# Patient Record
Sex: Male | Born: 1950 | Race: White | Hispanic: No | Marital: Married | Smoking: Former smoker
Health system: Southern US, Community
[De-identification: ages and names within clinical notes are randomized; demographics above are authoritative.]

## PROBLEM LIST (undated history)

## (undated) DIAGNOSIS — M5430 Sciatica, unspecified side: Secondary | ICD-10-CM

## (undated) DIAGNOSIS — R112 Nausea with vomiting, unspecified: Secondary | ICD-10-CM

## (undated) DIAGNOSIS — Z9889 Other specified postprocedural states: Secondary | ICD-10-CM

## (undated) DIAGNOSIS — G4730 Sleep apnea, unspecified: Secondary | ICD-10-CM

## (undated) DIAGNOSIS — K7460 Unspecified cirrhosis of liver: Secondary | ICD-10-CM

## (undated) DIAGNOSIS — I10 Essential (primary) hypertension: Secondary | ICD-10-CM

## (undated) DIAGNOSIS — Z87442 Personal history of urinary calculi: Secondary | ICD-10-CM

## (undated) HISTORY — DX: Sciatica, unspecified side: M54.30

## (undated) HISTORY — PX: HEMORRHOID SURGERY: SHX153

## (undated) HISTORY — PX: HERNIA REPAIR: SHX51

## (undated) HISTORY — DX: Unspecified cirrhosis of liver: K74.60

## (undated) HISTORY — PX: LITHOTRIPSY: SUR834

## (undated) HISTORY — PX: TONSILLECTOMY: SUR1361

## (undated) HISTORY — PX: TOOTH EXTRACTION: SUR596

---

## 2001-06-14 ENCOUNTER — Encounter: Payer: Self-pay | Admitting: Family Medicine

## 2001-06-14 ENCOUNTER — Ambulatory Visit (HOSPITAL_COMMUNITY): Admission: RE | Admit: 2001-06-14 | Discharge: 2001-06-14 | Payer: Self-pay | Admitting: Family Medicine

## 2001-11-16 ENCOUNTER — Encounter: Payer: Self-pay | Admitting: Emergency Medicine

## 2001-11-16 ENCOUNTER — Emergency Department (HOSPITAL_COMMUNITY): Admission: EM | Admit: 2001-11-16 | Discharge: 2001-11-16 | Payer: Self-pay | Admitting: Emergency Medicine

## 2003-06-23 ENCOUNTER — Emergency Department (HOSPITAL_COMMUNITY): Admission: EM | Admit: 2003-06-23 | Discharge: 2003-06-23 | Payer: Self-pay | Admitting: *Deleted

## 2003-07-06 ENCOUNTER — Encounter: Payer: Self-pay | Admitting: Urology

## 2003-07-06 ENCOUNTER — Ambulatory Visit (HOSPITAL_COMMUNITY): Admission: RE | Admit: 2003-07-06 | Discharge: 2003-07-06 | Payer: Self-pay | Admitting: Otolaryngology

## 2003-07-13 ENCOUNTER — Encounter: Payer: Self-pay | Admitting: Urology

## 2003-07-13 ENCOUNTER — Ambulatory Visit (HOSPITAL_COMMUNITY): Admission: RE | Admit: 2003-07-13 | Discharge: 2003-07-13 | Payer: Self-pay | Admitting: Urology

## 2003-07-19 ENCOUNTER — Encounter: Payer: Self-pay | Admitting: Urology

## 2003-07-19 ENCOUNTER — Ambulatory Visit (HOSPITAL_COMMUNITY): Admission: RE | Admit: 2003-07-19 | Discharge: 2003-07-19 | Payer: Self-pay | Admitting: Urology

## 2003-09-03 ENCOUNTER — Ambulatory Visit (HOSPITAL_COMMUNITY): Admission: RE | Admit: 2003-09-03 | Discharge: 2003-09-03 | Payer: Self-pay | Admitting: Urology

## 2004-03-03 ENCOUNTER — Ambulatory Visit (HOSPITAL_COMMUNITY): Admission: RE | Admit: 2004-03-03 | Discharge: 2004-03-03 | Payer: Self-pay | Admitting: *Deleted

## 2004-05-06 ENCOUNTER — Ambulatory Visit (HOSPITAL_COMMUNITY): Admission: RE | Admit: 2004-05-06 | Discharge: 2004-05-06 | Payer: Self-pay | Admitting: Internal Medicine

## 2005-05-01 ENCOUNTER — Ambulatory Visit (HOSPITAL_COMMUNITY): Admission: RE | Admit: 2005-05-01 | Discharge: 2005-05-01 | Payer: Self-pay | Admitting: Urology

## 2005-05-12 ENCOUNTER — Ambulatory Visit (HOSPITAL_COMMUNITY): Admission: RE | Admit: 2005-05-12 | Discharge: 2005-05-12 | Payer: Self-pay | Admitting: Urology

## 2005-11-20 ENCOUNTER — Ambulatory Visit (HOSPITAL_COMMUNITY): Admission: RE | Admit: 2005-11-20 | Discharge: 2005-11-20 | Payer: Self-pay | Admitting: Internal Medicine

## 2005-11-20 ENCOUNTER — Ambulatory Visit: Payer: Self-pay | Admitting: Internal Medicine

## 2005-11-20 HISTORY — PX: COLONOSCOPY: SHX174

## 2007-07-29 ENCOUNTER — Emergency Department (HOSPITAL_COMMUNITY): Admission: EM | Admit: 2007-07-29 | Discharge: 2007-07-29 | Payer: Self-pay | Admitting: Emergency Medicine

## 2008-03-22 ENCOUNTER — Ambulatory Visit (HOSPITAL_COMMUNITY): Admission: RE | Admit: 2008-03-22 | Discharge: 2008-03-22 | Payer: Self-pay | Admitting: Family Medicine

## 2008-05-02 ENCOUNTER — Encounter (INDEPENDENT_AMBULATORY_CARE_PROVIDER_SITE_OTHER): Payer: Self-pay | Admitting: General Surgery

## 2008-05-02 ENCOUNTER — Ambulatory Visit (HOSPITAL_COMMUNITY): Admission: RE | Admit: 2008-05-02 | Discharge: 2008-05-03 | Payer: Self-pay | Admitting: General Surgery

## 2009-01-07 ENCOUNTER — Ambulatory Visit (HOSPITAL_COMMUNITY): Admission: RE | Admit: 2009-01-07 | Discharge: 2009-01-07 | Payer: Self-pay | Admitting: General Surgery

## 2009-01-18 ENCOUNTER — Ambulatory Visit (HOSPITAL_COMMUNITY): Admission: RE | Admit: 2009-01-18 | Discharge: 2009-01-18 | Payer: Self-pay | Admitting: General Surgery

## 2010-01-24 ENCOUNTER — Inpatient Hospital Stay (HOSPITAL_COMMUNITY): Admission: EM | Admit: 2010-01-24 | Discharge: 2010-01-28 | Payer: Self-pay | Admitting: Emergency Medicine

## 2010-01-27 ENCOUNTER — Encounter (INDEPENDENT_AMBULATORY_CARE_PROVIDER_SITE_OTHER): Payer: Self-pay | Admitting: Urology

## 2010-01-29 ENCOUNTER — Emergency Department (HOSPITAL_COMMUNITY): Admission: EM | Admit: 2010-01-29 | Discharge: 2010-01-29 | Payer: Self-pay | Admitting: Emergency Medicine

## 2010-01-31 ENCOUNTER — Emergency Department (HOSPITAL_COMMUNITY): Admission: EM | Admit: 2010-01-31 | Discharge: 2010-01-31 | Payer: Self-pay | Admitting: Emergency Medicine

## 2010-03-11 ENCOUNTER — Ambulatory Visit (HOSPITAL_COMMUNITY): Admission: RE | Admit: 2010-03-11 | Discharge: 2010-03-11 | Payer: Self-pay | Admitting: Urology

## 2010-08-27 ENCOUNTER — Ambulatory Visit (HOSPITAL_COMMUNITY): Admission: RE | Admit: 2010-08-27 | Discharge: 2010-08-27 | Payer: Self-pay | Admitting: Internal Medicine

## 2011-01-25 LAB — CBC
HCT: 45.3 % (ref 39.0–52.0)
Hemoglobin: 15.9 g/dL (ref 13.0–17.0)
MCHC: 34.9 g/dL (ref 30.0–36.0)
MCV: 92.1 fL (ref 78.0–100.0)
MCV: 93.1 fL (ref 78.0–100.0)
Platelets: 154 10*3/uL (ref 150–400)
Platelets: 168 10*3/uL (ref 150–400)
RBC: 4.98 MIL/uL (ref 4.22–5.81)
RDW: 14.2 % (ref 11.5–15.5)
WBC: 12.3 10*3/uL — ABNORMAL HIGH (ref 4.0–10.5)
WBC: 8.2 10*3/uL (ref 4.0–10.5)

## 2011-01-25 LAB — DIFFERENTIAL
Basophils Absolute: 0 10*3/uL (ref 0.0–0.1)
Basophils Relative: 0 % (ref 0–1)
Basophils Relative: 1 % (ref 0–1)
Eosinophils Absolute: 0.2 10*3/uL (ref 0.0–0.7)
Eosinophils Relative: 1 % (ref 0–5)
Eosinophils Relative: 2 % (ref 0–5)
Lymphocytes Relative: 9 % — ABNORMAL LOW (ref 12–46)
Lymphs Abs: 1.1 10*3/uL (ref 0.7–4.0)
Monocytes Absolute: 0.5 10*3/uL (ref 0.1–1.0)
Monocytes Relative: 4 % (ref 3–12)
Neutro Abs: 10.7 10*3/uL — ABNORMAL HIGH (ref 1.7–7.7)
Neutro Abs: 5.1 10*3/uL (ref 1.7–7.7)
Neutrophils Relative %: 62 % (ref 43–77)
Neutrophils Relative %: 87 % — ABNORMAL HIGH (ref 43–77)

## 2011-01-25 LAB — GLUCOSE, CAPILLARY
Glucose-Capillary: 108 mg/dL — ABNORMAL HIGH (ref 70–99)
Glucose-Capillary: 110 mg/dL — ABNORMAL HIGH (ref 70–99)
Glucose-Capillary: 119 mg/dL — ABNORMAL HIGH (ref 70–99)
Glucose-Capillary: 119 mg/dL — ABNORMAL HIGH (ref 70–99)
Glucose-Capillary: 123 mg/dL — ABNORMAL HIGH (ref 70–99)
Glucose-Capillary: 125 mg/dL — ABNORMAL HIGH (ref 70–99)
Glucose-Capillary: 129 mg/dL — ABNORMAL HIGH (ref 70–99)
Glucose-Capillary: 148 mg/dL — ABNORMAL HIGH (ref 70–99)
Glucose-Capillary: 149 mg/dL — ABNORMAL HIGH (ref 70–99)
Glucose-Capillary: 161 mg/dL — ABNORMAL HIGH (ref 70–99)
Glucose-Capillary: 97 mg/dL (ref 70–99)

## 2011-01-25 LAB — URINE CULTURE
Colony Count: NO GROWTH
Culture: NO GROWTH

## 2011-01-25 LAB — BASIC METABOLIC PANEL
BUN: 11 mg/dL (ref 6–23)
CO2: 27 mEq/L (ref 19–32)
Calcium: 9.5 mg/dL (ref 8.4–10.5)
Calcium: 9.9 mg/dL (ref 8.4–10.5)
Chloride: 102 mEq/L (ref 96–112)
Creatinine, Ser: 0.86 mg/dL (ref 0.4–1.5)
Creatinine, Ser: 1.1 mg/dL (ref 0.4–1.5)
GFR calc Af Amer: >60 mL/min (ref >60)
GFR calc Af Amer: >60 mL/min (ref >60)
GFR calc non Af Amer: >60 mL/min (ref >60)
Glucose, Bld: 159 mg/dL — ABNORMAL HIGH (ref 70–99)
Potassium: 4.6 mEq/L (ref 3.5–5.1)
Sodium: 138 mEq/L (ref 135–145)

## 2011-01-25 LAB — URINE MICROSCOPIC-ADD ON

## 2011-01-25 LAB — URINALYSIS, ROUTINE W REFLEX MICROSCOPIC
Glucose, UA: NEGATIVE mg/dL
Leukocytes, UA: NEGATIVE
Nitrite: NEGATIVE
Protein, ur: 30 mg/dL — AB
Urobilinogen, UA: 0.2 mg/dL (ref 0.0–1.0)

## 2011-02-12 LAB — BASIC METABOLIC PANEL
CO2: 25 mEq/L (ref 19–32)
Chloride: 102 mEq/L (ref 96–112)
GFR calc Af Amer: >60 mL/min (ref >60)
GFR calc non Af Amer: >60 mL/min (ref >60)
Glucose, Bld: 116 mg/dL — ABNORMAL HIGH (ref 70–99)
Potassium: 4.3 mEq/L (ref 3.5–5.1)
Sodium: 136 mEq/L (ref 135–145)

## 2011-02-12 LAB — CBC
HCT: 43.9 % (ref 39.0–52.0)
Hemoglobin: 15.3 g/dL (ref 13.0–17.0)
MCHC: 34.8 g/dL (ref 30.0–36.0)
MCV: 94.8 fL (ref 78.0–100.0)
RBC: 4.64 MIL/uL (ref 4.22–5.81)
RDW: 14.2 % (ref 11.5–15.5)
WBC: 8.2 10*3/uL (ref 4.0–10.5)

## 2011-02-12 LAB — GLUCOSE, CAPILLARY: Glucose-Capillary: 98 mg/dL (ref 70–99)

## 2011-03-17 NOTE — H&P (Signed)
NAME:  Eric Dougherty, Eric Dougherty NO.:  [id number]   MEDICAL RECORD NO.:  [id number]         PATIENT TYPE:  AMB   LOCATION:  DAY                           FACILITY:  [facility]   PHYSICIAN:  Tilford Pillar, MD      DATE OF BIRTH:  [birth year]   DATE OF ADMISSION:  DATE OF DISCHARGE:  LH                              HISTORY & PHYSICAL   CHIEF COMPLAINT:  1. Right upper arm granule.  2. Umbilical hernia.   HISTORY OF PRESENT ILLNESS:  The patient is a 60 year old male who was  referred from [facility] office with 2 separate issues.  The first  has been present for several years with a notable mass in the right  posterior arm.  This has been slowly increased in size, is not relieved  without any pain.  No discomfort.  He has had no erythema or skin  changes overlying this.  He has had no discharge noted from this area.  Due to successfully slowly increasing size and occasional discomfort  with pressure related symptomatology, the patient was interested in  possible removal.  The second issue was a umbilical hernia, which he has  noted for approximately 1 year.  This is again slowly increased in size.  He has not had any signs and symptoms of incarceration or strangulation.  He has had no nausea, vomiting, no bowel changes and does have some  local discomfort in the area occasionally, but often he is asymptomatic.   PAST MEDICAL HISTORY:  1. Hypertension.  2. Diabetes mellitus type 2.  3. Hypercholesterolemia.  4. Sleep apnea.   PAST SURGICAL HISTORY:  Hemorrhoidectomy.   MEDICATIONS:  1. Aspirin.  2. Zocor.  3. Metformin.  4. He is on WelChol as well as on home CPAP.   ALLERGIES:  No known drug allergies.   SOCIAL HISTORY:  He does have a tobacco use history, but no current  usage, occasional alcohol use.  No recreational drug use.  Occupation,  he is a Higher education careers adviser.  He does not have any significant  heavy lifting.   REVIEW OF SYSTEMS:   CONSTITUTIONAL:  Unremarkable.  EYES:  Unremarkable.  EARS, NOSE, AND THROAT:  Unremarkable.  RESPIRATORY:  Unremarkable.  CARDIOVASCULAR:  Unremarkable.  GASTROINTESTINAL:  Unremarkable.  GENITOURINARY:  Unremarkable.  MUSCULOSKELETAL:  Unremarkable.  SKIN:  As per HPI otherwise unremarkable.  ENDOCRINE:  Occasional lack of  energy.  NEUROLOGIC:  Unremarkable.   PHYSICAL EXAMINATION:  GENERAL:  The patient is obese.  He is calm in no  acute distress.  He is alert and oriented x3.  HEENT:  Scalp, no deformities.  No masses.  Eyes, pupils equal, round,  and reactive.  Extraocular movements are intact.  No scleral icterus or  conjunctival pallor is noted.  Oral mucosa is pink and moist.  Trachea  is midline.  No cervical lymphadenopathy is present.  PULMONARY:  Unlabored respiration.  No wheezes.  No crackles.  He is clear to  auscultation bilaterally.  CARDIOVASCULAR:  Regular rate and rhythm.  No murmurs.  No gallops.  He  has 2+ radial  pulses.  ABDOMEN:  He has bowel sounds.  Abdomen is soft and nontender.  He does  have a small reducible umbilical hernia.  This is nontender.  He also  does have some bulging above the umbilicus in the upper abdomen  consistent with a diastasis.  GENITALIA:  Normal-appearing external genitalia.  Bilateral descended  testicles.  SKIN:  Warm and dry.  He does have a right posterior arm left shoulder  nodule.  This is mobile approximately 3 cm in size and soft,  symmetrical.   ASSESSMENT AND PLAN:  1. Lipoma the right arm  2. Umbilical hernia.  At this point, I did discuss with the patient's      findings, and did discuss with the patient the possibility of      excision of the lipoma at the same time this is umbilical hernia      repair.  The risks, benefits, and alternatives, which were      discussed at length with the patient.  The patient's questions and      questions concerns were addressed regarding this.  The risks,      benefits, and  alternatives of laparoscopic possible open approach      to the umbilical hernia repair were discussed also with the      patient.  The patient does wish to proceed with the umbilical      hernia via laparoscopic approach.  He is aware that this does have      potential having more postoperative pain associated with it and      understands the slightly decreased risk of mesh infection and      slightly decreased risk of recurrence due to the patient's history      of sleep apnea.  I did explain to the patient that I will watch him      overnight as a same-day admission due to the sleep apnea and need      for general anesthetic for his laparoscopic umbilical hernia      repair.  He is understands and does wish to proceed.      Tilford Pillar, MD  Electronically Signed     BZ/MEDQ  D:  04/26/2008  T:  04/27/2008  Job:  [id number]

## 2011-03-17 NOTE — Op Note (Signed)
NAME:  Eric Dougherty, PREIS NO.:  [id number]   MEDICAL RECORD NO.:  [id number]          PATIENT TYPE:  OIB   LOCATION:  A332                          FACILITY:  [facility]   PHYSICIAN:  Tilford Pillar, MD      DATE OF BIRTH:  January 14, 1951   DATE OF PROCEDURE:  05/02/2008  DATE OF DISCHARGE:                               OPERATIVE REPORT   PREOPERATIVE DIAGNOSES:  1. Umbilical hernia.  2. Soft-tissue mass of the right posterior arm.   POSTOPERATIVE DIAGNOSES:  1. Umbilical hernia.  2. Sebaceous cyst of the right posterior arm.   PROCEDURES:  1. Laparoscopic umbilical hernia repair with 10/15 cm Proceed mesh.  2. Excision of a sebaceous cyst of the right posterior arm through a 3-      cm incision.   SURGEON:  Tilford Pillar, MD   ANESTHESIA:  General endotracheal local anesthetic 0.5% Sensorcaine  plain.   SPECIMEN:  Sebaceous cyst and overlying skin of the right posterior arm.   ESTIMATED BLOOD LOSS:  Minimal.   INDICATIONS:  The patient is a 60 year old male with a history of sleep  apnea and diabetes mellitus.  He presented to my office with umbilical  hernia as well as a soft tissue mass in the right posterior arm.  We  discussed the possibility of laparoscopic, possible open umbilical  hernia repair as well as the an excision of the soft tissue mass at the  same time.  The risks, benefits and alternatives, which were discussed  at length with the patient including, but not limited to the risk of  bleeding, infection, mesh infection requiring removal of the mesh,  recurrence of the hernia or the soft tissue mass as well as possibility  of intraoperative cardiac or pulmonary events.  The patient's questions  and concerns were addressed, and the patient was consented for the  planned procedure.   OPERATION:  The patient was taken to the operating room, he was placed  in supine position on the operating table, at which time the general  anesthetic was  administered.  Once the patient was asleep, he was  endotracheally intubated by anesthesia.  Glide scope was required for  intubation and used to the somewhat difficult airway of the patient;  however, intubation was successfully obtained.  At this point, a Foley  catheter was placed in sterile fashion by the operating room staff.  His  abdomen was then prepped with DuraPrep solution and then draped in  standard fashion.  At this point, stab incision was created in the left  subcostal margin at palmar point.  Veress needle was inserted.  Saline  drop test was utilized to confirm intraperitoneal placement and  pneumoperitoneum was initiated.  Once sufficient pneumoperitoneum was  obtained.  An 11 mm trocar was placed in the left lateral abdominal wall  and slightly superior to the umbilicus.  This was placed over a  laparoscope allowing visualization trocar entering into the peritoneal  cavity.  The inner cannula was then removed and the laparoscope was  reinserted.  There was no evidence of any trocar or Veress needle  placement injury,  The Veress needle was removed at this time.  At this  time, a 5 mm trocar was placed in the left lateral abdominal wall just  inferior to the 11 mm trocar and the laparoscope was exchanged for a 30  degrees scope.  At this time, I easily identified the small umbilical  hernia defect.  This was approximately 1.5-2 cm in circumference.  The  falciform was divided approximately midway allowing adequate winding for  mesh to be tacked.  Hemostasis obtained using electrocautery.  At this  point using palpation of the anterior abdominal wall and marking pen was  utilized to mark the planned sites of pexing sutures.  Pneumoperitoneum  was then evacuated allowing adequate measurement of the defect, which  was approximately 10 x 10 cm.  Therefore 410 x 15 cm piece of Proceed  mesh brought the field. It was trimmed to the appropriate size.  It was  marked with  marking pen and the 2-0 Novofil pexing sutures were placed  at the four quadrants of the mesh.   The mesh was unrolled and was placed to the 11 mm trocar.  It was  unrolled within the abdominal cavity and using stab incisions at the  previously placed pexing sites.  A Endoclose suture passing device was  utilized to pass the Novofil sutures through the anterior abdominal wall  in all four pexing quadrants.  These were then pulled taut and the mesh  was evaluated.  This was done in excellent position.  The spiral ProTack  stapler was then brought to the field and was utilized to  circumferentially tack the mesh to the anterior abdominal wall.  This  resulted in excellent placement of the mesh.  This was quite clipped and  pleased with the appearance, and at this time attention was turned to  closure of the trocar sites   The pneumoperitoneum was evacuated.  The fascia was reapproximated using  a 2-0 Vicryl and UR needle with the fascia reapproximated.  A 4-0  Monocryl and was utilized to reapproximate skin edges at both trocar  sites in a running subcuticular suture.  The skin was washed, dried with  moist dry towel.  Benzoin was applied around the trocar sites and the 5  resulting stab incisions and 0.5 inch Steri-Strips were placed over  these stab incisions.  At this point, the drapes were removed and the  patient was repositioned.   The patient's right arm was gently abducted and placed over his chest  allowing exposure of the soft tissue mass on the right posterior arm  using a marking pen.  After prepping the field, sterile drapes were  placed and marking pen was utilized to mark the planned incision.  Scalpel was utilized to create the initial skin incision.  This  dissection down through subcuticular tissue utilized and carried out  using electrocautery.  The soft tissue mass was excised in  circumferentially with a elliptical defect of the skin incision created  again with the  scalpel.  The overlying skin was removed due to the close  approximation of the mass to the skin.  This mass was then placed in the  back table.  It was evident that this was a sebaceous cyst based on its  appearance and upon placing on back table and getting sebaceous material  from the inside of the mass.  At this point, the wound was irrigated.  Hemostasis was obtained using electrocautery.  A 3-0 Vicryl was utilized  to reapproximate deep subcuticular tissue and 4-0 Monocryl was utilized  to reapproximate skin edges in a running circular suture.  Skin was  washed and dried with moist dry towel.  Benzoin was applied.  After  injecting the local anesthetic, 05 inch Steri-Strips were then placed  over the incision.  The drapes  were removed.  The patient was allowed to come out of general anesthetic  and transferred back to regular hospital bed in stable condition.  At  the conclusion of the procedure, all instruments, sponge, and needle  counts were correct.  The patient tolerated procedure well.      Tilford Pillar, MD  Electronically Signed     BZ/MEDQ  D:  05/02/2008  T:  05/02/2008  Job:  [id number]   cc:   Petra Kuba, M.D.  Fax: [phone]

## 2011-03-17 NOTE — H&P (Signed)
NAME:  Eric Dougherty, Eric Dougherty NO.:  [id number]   MEDICAL RECORD NO.:  [id number]           PATIENT TYPE:  AMB   LOCATION:  DAY                           FACILITY:  [facility]   PHYSICIAN:  Tilford Pillar, MD      DATE OF BIRTH:  June 30, 1951   DATE OF ADMISSION:  DATE OF DISCHARGE:  LH                              HISTORY & PHYSICAL   CHIEF COMPLAINT:  1. Bulge over his umbilicus and discomfort.  2. Pain in his right fourth finger and occasionally pricking      sensation.   HISTORY OF PRESENT ILLNESS:  The patient is a 60 year old male, who  previously was seen by me for umbilical hernia.  He actually underwent  an uneventful laparoscopic hernia repair, had uneventful recovery and  was doing extremely well.  He then noted over the last subsequent month,  a increasing pressure sensation just above the umbilicus and noted  increasing area of bulging above this area.  He did not recall any  popping sensation or episodes of trauma to this area.  He has had no  nausea, vomiting, no change with bowel history, no melena, and no  hematochezia.  He states he does have also noticed increased pricking of  his fourth finger with occasional pain and the finger becoming hot and  stiff in position which has been known to come on  without aggravation.  He has had no fever or chills.  No history of trauma.   PAST MEDICAL HISTORY:  1. Hypertension.  2. Diabetes mellitus type 2.  3. Hypercholesterolemia.  4. Obstructive sleep apnea.   PAST SURGICAL HISTORY:  He has had hemorrhoidectomy, lipoma excision,  previous umbilical hernia repair.   MEDICATIONS:  Aspirin, Zocor, metformin, and WelChol.   ALLERGIES:  No known drug allergies.   SOCIAL HISTORY:  No tobacco, no alcohol, no recreational drugs.   REVIEW OF SYSTEMS:  Unremarkable for all systems including  constitutional, eyes, ears, nose, throat, respiratory, cardiovascular,  gastrointestinal, other than in the HPI.  Genitourinary,  skin,  musculoskeletal again other than in the HPI.   PHYSICAL EXAMINATION:  GENERAL:  The patient is a healthy-appearing male  in no acute distress.  He is alert and oriented x3.  He is calm on  presentation.  HEENT:  Scalp no deformities, no masses.  Eyes:  Pupils equal, round,  and reactive.  Extraocular movements are intact.  No scleral icterus or  conjunctival pallor is noted.  Oral mucosa is pink.  Normal occlusion.  NECK:  Trachea is midline.  No cervical lymphadenopathy.  PULMONARY:  Unlabored respiration.  Clear to auscultation bilaterally.  CARDIOVASCULAR:  Regular rate and rhythm.  A 2+ radial and dorsalis  pedis pulses bilaterally.  ABDOMEN:  Positive bowel sounds.  Abdomen is soft and nontender.  He  does have a palpable umbilical hernia.  There is a palpable  supraumbilical bulge, which is easy to reduce upon palpation.  No other  masses.  No other herniations.  SKIN:  Warm and dry.  EXTREMITIES:  The patient's finger skin does get hot in a flexed  position, it is relieved with active  extension of the joints.  There is  a popping sensation with full extension of the finger.   STUDIES:  CT of the abdomen does demonstrate a small umbilical hernia  with no evidence of any incarcerated material.   ASSESSMENT AND PLAN:  1. Recurrent umbilical hernia.  2. Trigger finger at the fourth digit.   PLAN:  1. In regards to the patient's recurrent umbilical hernia, I did      discuss with the patient again on proceeding with the repair.  At      this time, I suspected the mesh was pulled away superiorly,      although no active symptoms or clinical history of the patient      noting any sensation of a pop was noted.  Based on the findings, I      suspect that the mesh may have tore the staples.  Based on this, I      did discuss with the patient options of repairing including both      open and repeat of laparoscopic repair as the patient tolerated the      previous  laparoscopic repair extremely well and based on the fact      that this would give him the best chance for repair.  I did discuss      returning to repeat the laparoscopic umbilical hernia repair.  The      patient's questions and concerns were addressed, and the patient      will be consented for the planned procedure.  2. In regards to his trigger finger or flexor tenosynovitis (FTS)  of      the right ring finger, I did discuss referral to a hand specialist      in [place].  Since his initial visit he was evaluated by Dr.      Merlyn Lot in [place] and was offered cortisone injections.  This has      significantly improved his symptomatology and he will continue to      follow up with their office for continued treatment of this.  At      this time, I will plan to proceed at the patient's earliest      convenience for repair.      Tilford Pillar, MD  Electronically Signed     BZ/MEDQ  D:  01/15/2009  T:  01/16/2009  Job:  [id number]   cc:   [facility]  [place], [place]

## 2011-03-17 NOTE — Op Note (Signed)
NAME:  Eric Dougherty, Eric Dougherty NO.:  [id number]   MEDICAL RECORD NO.:  [id number]          PATIENT TYPE:  AMB   LOCATION:  DAY                           FACILITY:  [facility]   PHYSICIAN:  Tilford Pillar, MD      DATE OF BIRTH:  1951-01-28   DATE OF PROCEDURE:  01/18/2009  DATE OF DISCHARGE:                               OPERATIVE REPORT   PREOPERATIVE DIAGNOSIS:  Recurrent ventral incisional hernia.   POSTOPERATIVE DIAGNOSIS:  __________ventral hernia.   PROCEDURE:  1. Laparoscopic herniorrhaphy with 5 x 10 cm __________ stent.  2. Intraoperative placement of a On-Q analgesic catheter via direct      visualization of the laparoscope.   SURGEON:  Tilford Pillar, MD   ANESTHESIA:  General endotracheal local anesthetic 0.5 % bupivacaine  administered via the On-Q pain catheter.   SPECIMEN:  None.   ESTIMATED BLOOD LOSS:  Minimal.   INTRAOPERATIVE FINDINGS:  Previous __________.   INDICATIONS:  The patient is a 60 year old male who presented to my  office with notable area on his abdominal wall with increased bulging  and protruding.  This was over his umbilicus, similar previous ventral  hernia repair.  He had noticed increased discomfort in this area.  On  evaluation, was suspicious for recurrence.  A CT was obtained which  confirmed this did demonstrate a __________defect.  The risks, benefits,  and alternatives of a laparoscopic repair again were discussed with the  patient including, but not limited to the risk of bleeding, infection,  infection of the mesh requiring removal and subsequent repair as well as  possibility of intraoperative bowel injury, intraoperative cardiac and  pulmonary events.  The patient's questions and concerns were addressed  and the patient consented for the planned procedure.   OPERATION:  The patient was taken to the operating room, placed in the  supine position on the operating table, at which time the general  anesthetic was  administered.  Once the patient was asleep, she was  endotracheally intubated by Anesthesia.  His abdomen was prepped with  DuraPrep solution and draped in standard fashion.  Once the drapes were  placed, a stab incision was created in the left subcostal margin at  Palmer's point through which the Veress needle was inserted.  Saline  drop test was utilized to confirm the intraperitoneal placement, and  then the __________ within the pneumoperitoneum was initiated.  Once the  patient's pneumoperitoneum was obtained, a stab incision was created in  the left lateral abdominal wall over the previous stab incision scar,  through which a 11-mm trocar was inserted over the laparoscope allowing  visualization of the trocar entering into the peritoneal cavity.  Once  the catheter was placed, the inner cannula was removed.  The laparoscope  was reinserted.  There was no evidence of any trocar or Veress needle  placement injury with visualization.  At this time, the Veress needle  was removed from the subcostal margin.  At this point, inspection of the  anterior abdominal wall demonstrated an significant omental adhesions.  At this time, a 5-mm trocar was placed  just superior to the 11-mm trocar  site for a working port.  Combination of sharp and harmonic dissection  was utilized to free the omentum from the anterior abdominal wall.  The  mesh was exposed in total.  On visualization of the previously placed  spiral staple line securing the mesh in the anterior abdominal wall.  On  inspection of the mesh, the mesh was well granulated into the anterior  abdominal wall and the edges of the mesh were all intact.  There was no  evidence of any tearing of the staples or a defect along the edge or  suspicion of recurrence was high.  At this point, we continued to  palpate the abdominal wall in efforts to identify the natural defect.  At this point, on inspection, I was able to find a small tear within the   mesh itself and this was in the area where the site of hernia was.  In  addition, there was some omental fat that was able to be pulled out of  this area confirming that this was the area of recurrence.  At this  time, I did palpate the anterior abdominal wall to size the appropriate  piece of mesh for repair.  A marking pen was utilized to mark the  planned lateral, superior, and inferior borders.  Pneumoperitoneum was  evacuated __________ 5 x 10 cm piece of Prolene mesh was brought to the  field.  This was marked for appropriate orientation and then 2-0 Novofil  sutures were utilized for the tacking sutures at all 4 quadrants of the  mesh.  Mesh was rolled and advanced through the 11-mm trocar site.  At  this time, the mesh was unrolled __________   At this time, I did turn my attention to affixing the mesh to the  anterior abdominal wall.  Stab incisions were created at the previously  marked site through which a Endoclose suture passing device was utilized  to pull the 2-0 Novofil sutures through the anterior abdominal wall.  With all 4 of these pulled into position and the mesh lying in good  position around the defect, I secured all 4 sutures in the anterior  abdominal wall.  I then brought the spiral Protack device to the field  and tacked the mesh circumferentially in the anterior abdominal wall.  I  was quite pleased with the repair, turned the attention to closure.   Near the stab incision at [facility], I advanced the insertion  sheath through the On-Q pain pump.  The catheters were placed under the  left and right subcostal margins.  __________ around that area to secure  the catheters in normal fashion.  The catheters had been advanced in the  subcuticular space under direct visualization with the laparoscopy  ensuring no peritoneal invasion with the catheters.  At this time,  pneumoperitoneum was evacuated with the trocars.  I used a 4-0 Vicryl on  a UR needle to  secure the fascia at the 11-mm trocar site.  Local  anesthetic was instilled and the skin was reapproximated at the trocar  sites with 4-0 Monocryl.  It was washed and dried with moist dry towel  and benzoin was applied around the trocar site as well as the stab  incisions for the tacking sutures.  Half-inch Steri-Strips were placed  at all trocar and stab incision site.  At this time, the patient was  allowed to come out of the general anesthetic and was transferred back  to regular hospital bed in stable condition.  At the conclusion of  procedure, all instrument, sponge, and needle counts were correct.  The  patient tolerated the procedure extremely well.      Tilford Pillar, MD  Electronically Signed     BZ/MEDQ  D:  01/18/2009  T:  01/19/2009  Job:  [id number]   cc:   Primary Care Physician  ________[facility]

## 2011-03-20 NOTE — Op Note (Signed)
NAME:  Eric Dougherty, Eric Dougherty NO.:  [id number]   MEDICAL RECORD NO.:  [id number]          PATIENT TYPE:  AMB   LOCATION:  DAY                           FACILITY:  [facility]   PHYSICIAN:  R. Roetta Sessions, M.D. DATE OF BIRTH:  [birth year]   DATE OF PROCEDURE:  11/20/2005  DATE OF DISCHARGE:                                 OPERATIVE REPORT   PROCEDURE:  Screening colonoscopy.   INDICATIONS FOR PROCEDURE:  The patient is a 60 year old gentleman referred  by [facility] for screening colonoscopy. [name] There is no family history of  colorectal neoplasia. He has not had any rectal bleeding. He did see Dr.  Domingo Sep as a matter of routine recently and had had a five-day history of  right lower quadrant abdominal pain. An oral contrast CT was obtained at  [facility] which revealed no abnormalities aside from minimal  atelectasis in the lingula. No evidence of appendicitis. There was mentioned  of scattered diverticula in the distal colon but nothing inflammatory. He  tells me his right lower quadrant abdominal pain has subsided. Colonoscopy  is now being done. This approach has been discussed with the patient at  length. Potential risks, benefits, and alternatives have been reviewed and  questions answered. He is agreeable. Please see documentation in the medical  record.   PROCEDURE NOTE:  O2 saturation, blood pressure, pulse, and respirations were  monitored throughout the entire procedure. Conscious sedation with Versed 4  mg IV and Demerol 100 mg IV in divided doses.   INSTRUMENT:  Olympus video chip system.   FINDINGS:  Digital rectal exam revealed tender rectum and hemorrhoid.  Otherwise negative. Some viscous Xylocaine was used for topical anal canal  anesthesia.   Rectum:  Examination of the rectal mucosa including retroflexed view of the  anal verge revealed no abnormalities.   Colon:   Colonic mucosa was surveyed from the rectosigmoid junction through  the left, transverse, and right colon to the area of the appendiceal  orifice, ileocecal valve, and cecum. These structures were well seen and  photographed for the record. The terminal ileum was intubated a good 15 to  20 cm. From this level, the scope was slowly withdrawn, and all previously  mentioned mucosal surfaces were again seen. The colonic mucosa appeared  entirely normal. The terminal ileal mucosa appeared normal. The patient  tolerated the procedure well and was reactive to endoscopy.   IMPRESSION:  Minimal anal canal hemorrhoids. Otherwise normal rectum, colon  and terminal ileum.   RECOMMENDATIONS:  Repeat colonoscopy 10 years.      Jonathon Bellows, M.D.  Electronically Signed     RMR/MEDQ  D:  11/20/2005  T:  11/20/2005  Job:  [id number]   cc:   [facility]

## 2011-07-01 ENCOUNTER — Emergency Department (HOSPITAL_COMMUNITY)
Admission: EM | Admit: 2011-07-01 | Discharge: 2011-07-01 | Disposition: A | Discharge status: Home / Self Care | Payer: BC Managed Care – PPO | Attending: Emergency Medicine | Admitting: Emergency Medicine

## 2011-07-01 DIAGNOSIS — I10 Essential (primary) hypertension: Secondary | ICD-10-CM | POA: Insufficient documentation

## 2011-07-01 DIAGNOSIS — M549 Dorsalgia, unspecified: Secondary | ICD-10-CM | POA: Insufficient documentation

## 2011-07-01 DIAGNOSIS — M62838 Other muscle spasm: Secondary | ICD-10-CM | POA: Insufficient documentation

## 2011-07-01 DIAGNOSIS — E119 Type 2 diabetes mellitus without complications: Secondary | ICD-10-CM | POA: Insufficient documentation

## 2011-07-01 HISTORY — DX: Essential (primary) hypertension: I10

## 2011-07-01 MED ORDER — IBUPROFEN 800 MG PO TABS: 800.0000 mg | ORAL_TABLET | Freq: Three times a day (TID) | ORAL | Status: AC

## 2011-07-01 MED ORDER — HYDROCODONE-ACETAMINOPHEN 5-500 MG PO TABS: 1.0000 | ORAL_TABLET | Freq: Four times a day (QID) | ORAL | Status: AC | PRN

## 2011-07-01 MED ORDER — KETOROLAC TROMETHAMINE 60 MG/2ML IM SOLN
60.0000 mg | Freq: Once | INTRAMUSCULAR | Status: AC
  Administered 2011-07-01: 60 mg via INTRAMUSCULAR
  Filled 2011-07-01: qty 2

## 2011-07-01 NOTE — Discharge Instructions (Signed)
Your back pain should be treated with medicines such as ibuprofen or aleve and this back pain should get better over the next 2 weeks.  However if you develop severe or worsening pain, low back pain with fever, numbness, weakness or inability to walk or urinate, you should return to the ER immediately.  Please follow up with your doctor this week for a recheck if still having symptoms.  

## 2011-07-01 NOTE — ED Provider Notes (Addendum)
History   Scribed for Vida Roller, MD, the patient was seen in room APA04/APA04. This chart was scribed by Clarita Crane. This patient's care was started at 9:34AM.   CSN: [id number] Arrival date & time: 07/01/2011  9:03 AM  Chief Complaint  Patient presents with  . Back Pain   The history is provided by the patient.   Eric Dougherty is a 60 y.o. male who presents to the Emergency Department complaining of constant non-radiating lower back pain onset several days ago but worse yesterday and persistent since. Denies recent injury/trauma, weakness, dysuria, dark urine, hematuria, n/v/d, fever, constipation, rectal bleeding, abdominal pain, chest pain, SOB. States back pain is aggravated with movement (specifically moving from a standing position to sitting position and vice versa). Pain moderately relieved with standing.   Patient is intermittently severe worse with movement not associated with urinary retention, fever, weakness. He denies history of cancer. Medications including Robaxin and ibuprofen taken prior to arrival without any improvement  Not similar to kidney stone Was on feet for several hours before pain became worse   moderately relieved with standing Denies dysuria, hematuria No relief from Ibuprofen Patient with h/o 7mm kidney stone dx last year, patient was evaluated by urologist for condition.   Past Medical History  Diagnosis Date  . Diabetes mellitus   . Hypertension     Past Surgical History  Procedure Date  . Tonsillectomy   . Hernia repair   . Hemorrhoid surgery   . Lithotripsy     No family history on file.  History  Substance Use Topics  . Smoking status: Never Smoker   . Smokeless tobacco: Not on file  . Alcohol Use: Yes      Review of Systems 10 Systems reviewed and are negative for acute change except as noted in the HPI.  Physical Exam  BP 144/72  Pulse 80  Temp(Src) 97.7 F (36.5 C) (Oral)  Resp 20  Ht 5\' 8"  (1.727 m)  Wt 250 lb  (113.399 kg)  BMI 38.01 kg/m2  SpO2 97%  Physical Exam  Nursing note and vitals reviewed. Constitutional: He is oriented to person, place, and time. He appears well-developed and well-nourished.  HENT:  Head: Normocephalic and atraumatic.  Eyes: Pupils are equal, round, and reactive to light.  Neck: Neck supple.  Cardiovascular: Normal rate, regular rhythm and normal heart sounds.  Exam reveals no gallop and no friction rub.   No murmur heard. Pulmonary/Chest: Effort normal and breath sounds normal. He has no wheezes.  Musculoskeletal: Normal range of motion. He exhibits no edema.       Entire spine non-tender. No paraspinal tenderness.   Neurological: He is alert and oriented to person, place, and time. No sensory deficit.       Strength of lower extremities equal bilaterally.   Skin: Skin is warm and dry.  Psychiatric: He has a normal mood and affect. His behavior is normal.    ED Course  Procedures  MDM The patient able to ambulate around the room though he has significant with simple movements of his back and rotation of the hips.  But otherwise is able to ambulate and has normal muscle function and strength. Normal sensation of the lower 70s. He has no pathologic reflex for back pain. Will initiate anti-inflammatory treatment with intramuscular Toradol and sent home with hydrocodone. He states that he has Robaxin at home. Will have followup with his family doctor if needed.    I personally performed the  services described in this documentation, which was scribed in my presence. The recorded information has been reviewed and considered. Vida Roller, MD   Vida Roller, MD 07/01/11 [id number]  Vida Roller, MD 07/01/11 1324  Vida Roller, MD 07/01/11 [phone]

## 2011-07-01 NOTE — ED Notes (Signed)
C/o  Lower back pain for several days. Pt states he was on his feet "all day yesterday" pt c/o lower back pain that is piercing in nature. Ambulates slowly. Sitting aggravates pain. Denies any gi/gu symptoms. Pt states " feet feel heavy" denies any injury.

## 2011-07-30 LAB — CBC
HCT: 44
Hemoglobin: 15.3
MCV: 92.8
Platelets: 145 — ABNORMAL LOW
RBC: 4.75
RDW: 13.6
WBC: 6.9

## 2011-07-30 LAB — BASIC METABOLIC PANEL
Chloride: 103
GFR calc Af Amer: >60
Glucose, Bld: 86
Potassium: 4.4
Sodium: 137

## 2011-08-13 LAB — BASIC METABOLIC PANEL
BUN: 10
CO2: 25
Calcium: 9
Chloride: 106
Creatinine, Ser: 0.77
GFR calc Af Amer: >60
Glucose, Bld: 103 — ABNORMAL HIGH
Potassium: 4.8

## 2012-01-16 ENCOUNTER — Encounter (HOSPITAL_COMMUNITY): Payer: Self-pay | Admitting: *Deleted

## 2012-01-16 ENCOUNTER — Emergency Department (HOSPITAL_COMMUNITY): Payer: BC Managed Care – PPO

## 2012-01-16 ENCOUNTER — Emergency Department (HOSPITAL_COMMUNITY)
Admission: EM | Admit: 2012-01-16 | Discharge: 2012-01-16 | Disposition: A | Discharge status: Home / Self Care | Payer: BC Managed Care – PPO | Attending: Emergency Medicine | Admitting: Emergency Medicine

## 2012-01-16 DIAGNOSIS — R112 Nausea with vomiting, unspecified: Secondary | ICD-10-CM | POA: Insufficient documentation

## 2012-01-16 DIAGNOSIS — I10 Essential (primary) hypertension: Secondary | ICD-10-CM | POA: Insufficient documentation

## 2012-01-16 DIAGNOSIS — E119 Type 2 diabetes mellitus without complications: Secondary | ICD-10-CM | POA: Insufficient documentation

## 2012-01-16 DIAGNOSIS — R5381 Other malaise: Secondary | ICD-10-CM | POA: Insufficient documentation

## 2012-01-16 DIAGNOSIS — R5383 Other fatigue: Secondary | ICD-10-CM | POA: Insufficient documentation

## 2012-01-16 DIAGNOSIS — R197 Diarrhea, unspecified: Secondary | ICD-10-CM | POA: Insufficient documentation

## 2012-01-16 LAB — URINALYSIS, ROUTINE W REFLEX MICROSCOPIC
Bilirubin Urine: NEGATIVE
Glucose, UA: NEGATIVE mg/dL
Hgb urine dipstick: NEGATIVE
Specific Gravity, Urine: >1.03 — ABNORMAL HIGH (ref 1.005–1.030)
Urobilinogen, UA: 0.2 mg/dL (ref 0.0–1.0)
pH: 6 (ref 5.0–8.0)

## 2012-01-16 LAB — HEPATIC FUNCTION PANEL
AST: 28 U/L (ref 0–37)
Albumin: 4.1 g/dL (ref 3.5–5.2)
Alkaline Phosphatase: 62 U/L (ref 39–117)
Bilirubin, Direct: 0.2 mg/dL (ref 0.0–0.3)
Indirect Bilirubin: 0.4 mg/dL (ref 0.3–0.9)
Total Bilirubin: 0.6 mg/dL (ref 0.3–1.2)

## 2012-01-16 LAB — CBC
HCT: 46.8 % (ref 39.0–52.0)
MCH: 32.4 pg (ref 26.0–34.0)
MCV: 93.6 fL (ref 78.0–100.0)
Platelets: 149 10*3/uL — ABNORMAL LOW (ref 150–400)
RBC: 5 MIL/uL (ref 4.22–5.81)
RDW: 13.4 % (ref 11.5–15.5)

## 2012-01-16 LAB — BASIC METABOLIC PANEL
CO2: 27 mEq/L (ref 19–32)
Calcium: 9.3 mg/dL (ref 8.4–10.5)
Chloride: 97 mEq/L (ref 96–112)
Creatinine, Ser: 0.81 mg/dL (ref 0.50–1.35)
GFR calc non Af Amer: >90 mL/min (ref >90)
Glucose, Bld: 157 mg/dL — ABNORMAL HIGH (ref 70–99)
Sodium: 136 mEq/L (ref 135–145)

## 2012-01-16 LAB — DIFFERENTIAL
Basophils Absolute: 0 10*3/uL (ref 0.0–0.1)
Basophils Relative: 0 % (ref 0–1)
Eosinophils Absolute: 0 10*3/uL (ref 0.0–0.7)
Eosinophils Relative: 0 % (ref 0–5)
Lymphs Abs: 0.3 10*3/uL — ABNORMAL LOW (ref 0.7–4.0)
Monocytes Absolute: 0.4 10*3/uL (ref 0.1–1.0)
Neutro Abs: 9.2 10*3/uL — ABNORMAL HIGH (ref 1.7–7.7)
Neutrophils Relative %: 93 % — ABNORMAL HIGH (ref 43–77)

## 2012-01-16 LAB — LIPASE, BLOOD: Lipase: 20 U/L (ref 11–59)

## 2012-01-16 MED ORDER — ACETAMINOPHEN 500 MG PO TABS
1000.0000 mg | ORAL_TABLET | Freq: Once | ORAL | Status: AC
  Administered 2012-01-16: 1000 mg via ORAL
  Filled 2012-01-16: qty 2

## 2012-01-16 MED ORDER — SODIUM CHLORIDE 0.9 % IV SOLN
Freq: Once | INTRAVENOUS | Status: AC
  Administered 2012-01-16: 15:00:00 via INTRAVENOUS

## 2012-01-16 MED ORDER — ONDANSETRON 4 MG PO TBDP: 4.0000 mg | ORAL_TABLET | Freq: Four times a day (QID) | ORAL | Status: AC | PRN

## 2012-01-16 MED ORDER — SODIUM CHLORIDE 0.9 % IV SOLN: INTRAVENOUS | Status: DC

## 2012-01-16 MED ORDER — ONDANSETRON HCL 4 MG/2ML IJ SOLN
4.0000 mg | INTRAMUSCULAR | Status: DC | PRN
  Administered 2012-01-16: 4 mg via INTRAVENOUS
  Filled 2012-01-16: qty 2

## 2012-01-16 NOTE — Discharge Instructions (Signed)
RESOURCE GUIDE  Dental Problems  Patients with Medicaid: [facility]                     [facility] [address]                                           [address] Phone:  [phone]                                                  Phone:  [phone]  If unable to pay or uninsured, contact:  [facility] or [facility] to become qualified for the adult dental clinic.  Chronic Pain Problems Contact [facility]  [phone] Patients need to be referred by their primary care doctor.  Insufficient Money for Medicine Contact [employer]:  call "211" or [facility] 271-5999.  No Primary Care Doctor Call [facility]  [phone] Other agencies that provide inexpensive medical care    [facility]  [phone]    [facility]  [phone]    [facility]  [phone]    Women's Clinic  [phone]    [facility]  [phone]    [facility]  [phone]  Psychological Services [facility]  [phone] [facility]  [phone] [facility]   [phone] (emergency services [phone])  Substance Abuse Resources [facility]  336-882-2125 [facility] 336-784-9470 [facility] 336-285-9073 [facility] 336-845-3988 Residential & Outpatient Substance Abuse Program  800-659-3381  Abuse/Neglect [place] Child Abuse Hotline (336) 641-3795 [place] Child Abuse Hotline 800-378-5315 (After Hours)  Emergency Shelter [facility] (336) 271-5985  Maternity Homes [facility] (336) 275-9566 [facility] (704) 372-4663  MRSA Hotline #:   832-7006    [place] [facility]  [facility] [place]     [employer]                          [facility] [address] [place]                       [address]      [address]  [place]                                                [place]                            [place] Phone:  [phone]                                   Phone:  [phone]                 Phone:  [phone]  [facility] Phone:  [phone]    [facility] Child Abuse Hotline (760) 482-3633 628-414-4330 (After Hours)    Take the prescription as directed.  Increase your fluid intake (ie:  Gatoraide) for the next few days, as discussed.  Eat a bland diet and advance to your regular diet slowly as you can tolerate it.   Avoid full strength juices, as well as milk and milk products until your diarrhea has resolved.   Call your regular medical doctor Monday to schedule a follow up appointment this week.  Return to the Emergency Department immediately if not improving (or even worsening) despite taking the medicines as prescribed, any black or bloody stool or vomit, or for any other concerns.

## 2012-01-16 NOTE — ED Notes (Signed)
Pt also states diarrhea.

## 2012-01-16 NOTE — ED Provider Notes (Signed)
History     CSN: [id number]  Arrival date & time 01/16/12  1204   First MD Initiated Contact with Patient 01/16/12 1421      Chief Complaint  Patient presents with  . Emesis    HPI Pt was seen at 1425.  Per pt, c/o gradual onset and persistence of multiple intermittent episodes of N/V/D since last night.  Has been assoc with chills, and generalized body aches/fatigue.  Denies CP/SOB, no cough, no rash, no black or blood in stools or emesis, no abd pain.       Past Medical History  Diagnosis Date  . Diabetes mellitus   . Hypertension     Past Surgical History  Procedure Date  . Tonsillectomy   . Hernia repair   . Hemorrhoid surgery   . Lithotripsy     History  Substance Use Topics  . Smoking status: Never Smoker   . Smokeless tobacco: Not on file  . Alcohol Use: Yes      Review of Systems ROS: Statement: All systems negative except as marked or noted in the HPI; Constitutional: +fever and chills, generalized body aches/fatigue. ; ; Eyes: Negative for eye pain, redness and discharge. ; ; ENMT: Negative for ear pain, hoarseness, nasal congestion, sinus pressure and sore throat. ; ; Cardiovascular: Negative for chest pain, palpitations, diaphoresis, dyspnea and peripheral edema. ; ; Respiratory: Negative for cough, wheezing and stridor. ; ; Gastrointestinal: +N/V/D. Negative for abdominal pain, blood in stool, hematemesis, jaundice and rectal bleeding. . ; ; Genitourinary: Negative for dysuria, flank pain and hematuria. ; ; Musculoskeletal: Negative for back pain and neck pain. Negative for swelling and trauma.; ; Skin: Negative for pruritus, rash, abrasions, blisters, bruising and skin lesion.; ; Neuro: Negative for headache, lightheadedness and neck stiffness. Negative for weakness, altered level of consciousness , altered mental status, extremity weakness, paresthesias, involuntary movement, seizure and syncope.     Allergies  Review of patient's allergies indicates no  known allergies.  Home Medications   Current Outpatient Rx  Name Route Sig Dispense Refill  . HYPROMELLOSE 2.5 % OP SOLN Both Eyes Place 1 drop into both eyes daily as needed. Dry Eyes     . IBUPROFEN 800 MG PO TABS Oral Take 800 mg by mouth every 8 (eight) hours as needed. Pain     . LISINOPRIL 10 MG PO TABS Oral Take 10 mg by mouth daily.      Marland Kitchen METFORMIN HCL 500 MG PO TABS Oral Take 1,000 mg by mouth 2 (two) times daily.        BP 143/80  Pulse 114  Temp(Src) 101.3 F (38.5 C) (Oral)  Resp 18  SpO2 96%  Physical Exam 1430: Physical examination:  Nursing notes reviewed; Vital signs and O2 SAT reviewed;  Constitutional: Well developed, Well nourished, In no acute distress; Head:  Normocephalic, atraumatic; Eyes: EOMI, PERRL, No scleral icterus; ENMT: Mouth and pharynx normal, Mucous membranes dry; Neck: Supple, Full range of motion, No lymphadenopathy; Cardiovascular: Tachycardic rate and rhythm, No murmur or gallop; Respiratory: Breath sounds clear & equal bilaterally, No rales, rhonchi, wheezes, or rub, Normal respiratory effort/excursion; Chest: Nontender, Movement normal; Abdomen: Soft, Nontender, Nondistended, Normal bowel sounds; Genitourinary: No CVA tenderness; Extremities: Pulses normal, No tenderness, No edema, No calf edema or asymmetry.; Neuro: AA&Ox3, Major CN grossly intact.  No gross focal motor or sensory deficits in extremities.; Skin: Color normal, Warm, Dry, no rash.    ED Course  Procedures    MDM  MDM Reviewed: nursing note and vitals Interpretation: labs and x-ray   Results for orders placed during the hospital encounter of 01/16/12  CBC      Component Value Range   WBC 9.9  4.0 - 10.5 (K/uL)   RBC 5.00  4.22 - 5.81 (MIL/uL)   Hemoglobin 16.2  13.0 - 17.0 (g/dL)   HCT 16.1  09.6 - 04.5 (%)   MCV 93.6  78.0 - 100.0 (fL)   MCH 32.4  26.0 - 34.0 (pg)   MCHC 34.6  30.0 - 36.0 (g/dL)   RDW 40.9  81.1 - 91.4 (%)   Platelets 149 (*) 150 - 400 (K/uL)    DIFFERENTIAL      Component Value Range   Neutrophils Relative 93 (*) 43 - 77 (%)   Neutro Abs 9.2 (*) 1.7 - 7.7 (K/uL)   Lymphocytes Relative 3 (*) 12 - 46 (%)   Lymphs Abs 0.3 (*) 0.7 - 4.0 (K/uL)   Monocytes Relative 4  3 - 12 (%)   Monocytes Absolute 0.4  0.1 - 1.0 (K/uL)   Eosinophils Relative 0  0 - 5 (%)   Eosinophils Absolute 0.0  0.0 - 0.7 (K/uL)   Basophils Relative 0  0 - 1 (%)   Basophils Absolute 0.0  0.0 - 0.1 (K/uL)  BASIC METABOLIC PANEL      Component Value Range   Sodium 136  135 - 145 (mEq/L)   Potassium 4.0  3.5 - 5.1 (mEq/L)   Chloride 97  96 - 112 (mEq/L)   CO2 27  19 - 32 (mEq/L)   Glucose, Bld 157 (*) 70 - 99 (mg/dL)   BUN 19  6 - 23 (mg/dL)   Creatinine, Ser 7.82  0.50 - 1.35 (mg/dL)   Calcium 9.3  8.4 - 95.6 (mg/dL)   GFR calc non Af Amer >90  >90 (mL/min)   GFR calc Af Amer >90  >90 (mL/min)  LIPASE, BLOOD      Component Value Range   Lipase 20  11 - 59 (U/L)  URINALYSIS, ROUTINE W REFLEX MICROSCOPIC      Component Value Range   Color, Urine YELLOW  YELLOW    APPearance HAZY (*) CLEAR    Specific Gravity, Urine >1.030 (*) 1.005 - 1.030    pH 6.0  5.0 - 8.0    Glucose, UA NEGATIVE  NEGATIVE (mg/dL)   Hgb urine dipstick NEGATIVE  NEGATIVE    Bilirubin Urine NEGATIVE  NEGATIVE    Ketones, ur NEGATIVE  NEGATIVE (mg/dL)   Protein, ur NEGATIVE  NEGATIVE (mg/dL)   Urobilinogen, UA 0.2  0.0 - 1.0 (mg/dL)   Nitrite NEGATIVE  NEGATIVE    Leukocytes, UA NEGATIVE  NEGATIVE   HEPATIC FUNCTION PANEL      Component Value Range   Total Protein 7.8  6.0 - 8.3 (g/dL)   Albumin 4.1  3.5 - 5.2 (g/dL)   AST 28  0 - 37 (U/L)   ALT 40  0 - 53 (U/L)   Alkaline Phosphatase 62  39 - 117 (U/L)   Total Bilirubin 0.6  0.3 - 1.2 (mg/dL)   Bilirubin, Direct 0.2  0.0 - 0.3 (mg/dL)   Indirect Bilirubin 0.4  0.3 - 0.9 (mg/dL)   Dg Abd Acute W/chest 01/16/2012  *RADIOLOGY REPORT*  Clinical Data: Abdominal pain, rule out small bowel obstruction  ACUTE ABDOMEN SERIES  (ABDOMEN 2 VIEW & CHEST 1 VIEW)  Comparison: 03/11/2010  Findings: Cardiomediastinal silhouette is unremarkable.  No acute  infiltrate or pleural effusion.  No pulmonary edema.  There is probable calcified calculus in the lower pole region of the right kidney measures 4 mm.  Left pelvic phleboliths are noted.  There is nonspecific nonobstructive bowel gas pattern.  No free abdominal air.  Mild degenerative changes lumbar spine  IMPRESSION: .  No acute disease within chest.  Nonspecific nonobstructive bowel gas pattern.  Probable calcified calculus lower pole region of the right kidney measures about 4 mm .  Original Report Authenticated By: Natasha Mead, M.D.    5:14 PM:  Has tol PO well while in ED and wants to go home now.  No vomiting or stooling while in ED.  Dx testing d/w pt and family.  Questions answered.  Verb understanding, agreeable to d/c home with outpt f/u.           Laray Anger, DO 01/17/12 1607

## 2012-01-16 NOTE — ED Notes (Signed)
Vomiting and leg cramps began last night.

## 2012-01-17 LAB — URINE CULTURE
Colony Count: NO GROWTH
Culture  Setup Time: 201303162006
Culture: NO GROWTH

## 2013-01-31 ENCOUNTER — Other Ambulatory Visit (HOSPITAL_COMMUNITY): Payer: Self-pay | Admitting: Family Medicine

## 2013-01-31 DIAGNOSIS — R7402 Elevation of levels of lactic acid dehydrogenase (LDH): Secondary | ICD-10-CM

## 2013-02-03 ENCOUNTER — Ambulatory Visit (HOSPITAL_COMMUNITY)
Admission: RE | Admit: 2013-02-03 | Discharge: 2013-02-03 | Disposition: A | Discharge status: Home / Self Care | Payer: BC Managed Care – PPO | Source: Ambulatory Visit | Attending: Family Medicine | Admitting: Family Medicine

## 2013-02-03 DIAGNOSIS — R7401 Elevation of levels of liver transaminase levels: Secondary | ICD-10-CM | POA: Insufficient documentation

## 2013-02-03 DIAGNOSIS — K7689 Other specified diseases of liver: Secondary | ICD-10-CM | POA: Insufficient documentation

## 2013-02-03 DIAGNOSIS — R7402 Elevation of levels of lactic acid dehydrogenase (LDH): Secondary | ICD-10-CM | POA: Insufficient documentation

## 2013-05-30 ENCOUNTER — Ambulatory Visit (INDEPENDENT_AMBULATORY_CARE_PROVIDER_SITE_OTHER): Payer: BC Managed Care – PPO | Admitting: Urology

## 2013-05-30 ENCOUNTER — Ambulatory Visit (HOSPITAL_COMMUNITY)
Admission: RE | Admit: 2013-05-30 | Discharge: 2013-05-30 | Disposition: A | Discharge status: Home / Self Care | Payer: BC Managed Care – PPO | Source: Ambulatory Visit | Attending: Urology | Admitting: Urology

## 2013-05-30 ENCOUNTER — Ambulatory Visit: Payer: Self-pay | Admitting: Urology

## 2013-05-30 ENCOUNTER — Other Ambulatory Visit: Payer: Self-pay | Admitting: Urology

## 2013-05-30 ENCOUNTER — Ambulatory Visit (HOSPITAL_COMMUNITY): Payer: Self-pay

## 2013-05-30 DIAGNOSIS — N200 Calculus of kidney: Secondary | ICD-10-CM

## 2013-05-30 DIAGNOSIS — Z87442 Personal history of urinary calculi: Secondary | ICD-10-CM | POA: Insufficient documentation

## 2013-05-30 DIAGNOSIS — E291 Testicular hypofunction: Secondary | ICD-10-CM

## 2013-11-23 ENCOUNTER — Ambulatory Visit (HOSPITAL_COMMUNITY)
Admission: RE | Admit: 2013-11-23 | Discharge: 2013-11-23 | Disposition: A | Discharge status: Home / Self Care | Payer: BC Managed Care – PPO | Source: Ambulatory Visit | Attending: Podiatry | Admitting: Podiatry

## 2013-11-23 DIAGNOSIS — M7661 Achilles tendinitis, right leg: Secondary | ICD-10-CM | POA: Insufficient documentation

## 2013-11-23 DIAGNOSIS — M25579 Pain in unspecified ankle and joints of unspecified foot: Secondary | ICD-10-CM | POA: Insufficient documentation

## 2013-11-23 DIAGNOSIS — E119 Type 2 diabetes mellitus without complications: Secondary | ICD-10-CM | POA: Insufficient documentation

## 2013-11-23 DIAGNOSIS — I10 Essential (primary) hypertension: Secondary | ICD-10-CM | POA: Insufficient documentation

## 2013-11-23 DIAGNOSIS — IMO0001 Reserved for inherently not codable concepts without codable children: Secondary | ICD-10-CM | POA: Insufficient documentation

## 2013-11-23 DIAGNOSIS — M7662 Achilles tendinitis, left leg: Secondary | ICD-10-CM | POA: Insufficient documentation

## 2013-11-23 DIAGNOSIS — R269 Unspecified abnormalities of gait and mobility: Secondary | ICD-10-CM | POA: Insufficient documentation

## 2013-11-23 NOTE — Evaluation (Signed)
Physical Therapy Evaluation  Patient Details  Name: Eric Dougherty MRN: [id number] Date of Birth: 11/04/1950  Today's Date: 11/23/2013 Time: [id number] PT Time Calculation (min): 45 min  Charges 1 evaluation             Visit#: 1 of 12  Re-eval: 12/23/13 Assessment Diagnosis: achilles tendonitis R > L  Surgical Date: 09/02/13   No surgery, onset date of 09/02/2013  Next MD Visit: Dr. Suzette BattiestZeigler  Prior Therapy: no prior PT   Authorization: [employer]    Authorization Time Period:    Authorization Visit#: 1 of 12   Past Medical History:  Past Medical History  Diagnosis Date  . Diabetes mellitus   . Hypertension    Past Surgical History:  Past Surgical History  Procedure Laterality Date  . Tonsillectomy    . Hernia repair    . Hemorrhoid surgery    . Lithotripsy      Subjective Symptoms/Limitations  Symptoms: c/o R heel pain, currently wearing CAM boot, left heel much improved ,  sudden onset 3 months ago, symptoms correlated to walking program , pateint was walking 3 miles daily , possibly needed new footwear at that time  Pertinent History: 63 year old male, referred for achilles tendonitis , has used CAM walker, night splints, injection , initially had swelling , no current home exercise for condition, x rays revealed posterior  heel spur  Limitations: Standing;Walking How long can you sit comfortably?: unlimited sitting  Patient Stated Goals: get out of CAM boot, return to walking pain free  Pain Assessment Currently in Pain?: Yes Pain Score: 2  Pain Location: Heel Pain Orientation: Right Pain Type: Acute pain Pain Onset: More than a month ago Pain Frequency: Intermittent, pain intense at onset, has decreased  Pain Relieving Factors: injections, rest  Effect of Pain on Daily Activities: pain with walking but minimal pain while walking in boot   Precautions/Restrictions  Restrictions Weight Bearing Restrictions: No Other Position/Activity Restrictions: CAM boot 1 more  week   Balance Screening Balance Screen Has the patient fallen in the past 6 months: No Has the patient had a decrease in activity level because of a fear of falling? : Yes Is the patient reluctant to leave their home because of a fear of falling? : No  Prior Functioning  Prior Function Level of Independence: Independent with basic ADLs;Independent with gait Vocation: Retired Comments: walking 3 miles pain free , had to stop in November per pain   Cognition/Observation Observation/Other Assessments Observations: CAM boot R foot , B longitudinal arches WNL    Palpation: tender R distal medial achilles tendon   Assessment RLE AROM (degrees) Right Ankle Dorsiflexion: 10 Right Ankle Plantar Flexion: 40 Right Ankle Inversion: 25 Right Ankle Eversion: 15 LLE AROM (degrees) Left Ankle Dorsiflexion: 10 Left Ankle Plantar Flexion: 40 Left Ankle Inversion: 25 Left Ankle Eversion: 10      Physical Therapy Assessment and Plan PT Assessment and Plan Clinical Impression Statement: 63 year old male referred for achilles tendonitis R. His symptoms are consistent with dx.  Pt will benefit from skilled therapeutic intervention in order to improve on the following deficits: Pain;Difficulty walking;Impaired flexibility;Abnormal gait Rehab Potential: Good Clinical Impairments Affecting Rehab Potential: 63 year old male referred for achilles tendonitis R. His symptoms are consistent with dx.  PT Frequency: Min 3X/week PT Duration: 4 weeks PT Treatment/Interventions: Gait training;Therapeutic activities;Therapeutic exercise;Patient/family education;Manual techniques;Modalities PT Plan: ultrasound, manual friction massage, stretches , kinesio taping     Goals Home Exercise  Program Pt/caregiver will Perform Home Exercise Program: Independently PT Short Term Goals Time to Complete Short Term Goals: 2 weeks PT Short Term Goal 1: walking level terrain out of CAM boot 10 min without  symptoms of  pain  PT Long Term Goals Time to Complete Long Term Goals: 4 weeks PT Long Term Goal 1: ambulate 1.5  miles without symptoms  PT Long Term Goal 2: deny tenderness with palpation at right distal achilles tendon  Long Term Goal 3: understand basic walking program and slow increase in walking distance   Problem List Patient Active Problem List   Diagnosis Date Noted  . Tendonitis, Achilles, right 11/23/2013  . Achilles tendonitis, bilateral 11/23/2013    PT Plan of Care PT Patient Instructions: heel cord stretch with strap, continue icing  Consulted and Agree with Plan of Care: Patient  GP    Eric Dougherty 11/23/2013, 3:03 PM  Physician Documentation Your signature is required to indicate approval of the treatment plan as stated above.  Please sign and either send electronically or make a copy of this report for your files and return this physician signed original.   Please mark one 1.__approve of plan  2. ___approve of plan with the following conditions.   ______________________________                                                          _____________________ Physician Signature                                                                                                             Date

## 2013-11-27 ENCOUNTER — Ambulatory Visit (HOSPITAL_COMMUNITY)
Admission: RE | Admit: 2013-11-27 | Discharge: 2013-11-27 | Disposition: A | Discharge status: Home / Self Care | Payer: BC Managed Care – PPO | Source: Ambulatory Visit | Attending: Podiatry | Admitting: Podiatry

## 2013-11-27 NOTE — Progress Notes (Signed)
Physical Therapy Treatment Patient Details  Name: Eric ChiquitoCharles W Hoff MRN: [id number] Date of Birth: 06/01/1951  Today's Date: 11/27/2013 Time: 2952-84130933-1015 PT Time Calculation (min): 42 min Charges: Therex x [phone]'(0933-0941) Ultrasound x 5'(3664-40348'(0942-0950) Manual x 22'(0953-1015)  Visit#: 3 of 12  Re-eval: 12/23/13  Authorization: BCBS  Authorization Visit#: 3 of 12   Subjective: Symptoms/Limitations Symptoms: Pt states that pain is much less than it once was. Pain Assessment Currently in Pain?: Yes Pain Score: 2  Pain Location: Ankle Pain Orientation: Right;Posterior   Exercise/Treatments Ankle Stretches Soleus Stretch: 3 reps;30 seconds Gastroc Stretch: 3 reps;30 seconds  Modalities Modalities: Ultrasound Manual Therapy Manual Therapy: Myofascial release Myofascial Release: to right gastroc and soleus to decreas tightness  Ultrasound Ultrasound Location: Right achilles tendon Ultrasound Parameters: 3MHz 0.8w/cm2 pulsed x 8'  Ultrasound Goals: Pain  Physical Therapy Assessment and Plan PT Assessment and Plan Clinical Impression Statement: Began stretching, manual techniques and non-thermal ultrasound to decrease tightness, pain and facilitate healing. Pt completes stretches well after initial cueing and demo. HEP given for gastroc and soleus stretch, see scanned documents. Non thermal ultrasound completed to right achilles tendon to facilitate healing. MFR completed throughout gastroc and soleus to decrease tightness. Pt will benefit from skilled therapeutic intervention in order to improve on the following deficits: Pain;Difficulty walking;Impaired flexibility;Abnormal gait Rehab Potential: Good Clinical Impairments Affecting Rehab Potential: 63 year old male referred for achilles tendonitis R. His symptoms are consistent with dx.  PT Frequency: Min 3X/week PT Duration: 4 weeks PT Treatment/Interventions: Gait training;Therapeutic activities;Therapeutic exercise;Patient/family  education;Manual techniques;Modalities PT Plan: Continue with manual, non-thermal ultrasound and stretching per PT POC.    Problem List Patient Active Problem List   Diagnosis Date Noted  . Tendonitis, Achilles, right 11/23/2013  . Achilles tendonitis, bilateral 11/23/2013    PT - End of Session Activity Tolerance: Patient tolerated treatment well General Behavior During Therapy: [facility] for tasks assessed/performed  Seth Bakeebekah , PTA  11/27/2013, 12:23 PM

## 2013-11-29 ENCOUNTER — Ambulatory Visit (HOSPITAL_COMMUNITY)
Admission: RE | Admit: 2013-11-29 | Discharge: 2013-11-29 | Disposition: A | Discharge status: Home / Self Care | Payer: BC Managed Care – PPO | Source: Ambulatory Visit | Attending: Podiatry | Admitting: Podiatry

## 2013-11-29 NOTE — Progress Notes (Signed)
Physical Therapy Treatment Patient Details  Name: Eric Dougherty MRN: [id number] Date of Birth: 04/28/1951  Today's Date: 11/29/2013 Time: 1191-47820845-0925 PT Time Calculation (min): 40 min Charges: Therex x [phone]'(0845-0857) Ultrasound x 7'(8469-62958'(0858-0906) Manual x 18'(0909-0925)   Visit#: 4 of 12  Re-eval: 12/23/13  Authorization: BCBS  Authorization Visit#: 4 of 12   Subjective: Symptoms/Limitations Symptoms: Pt states that his foot and ankle felt much better after last session. Pain Assessment Currently in Pain?: No/denies   Exercise/Treatments Ankle Stretches Plantar Fascia Stretch: 2 reps;30 seconds Soleus Stretch: 3 reps;30 seconds Gastroc Stretch: 3 reps;30 seconds  Modalities Modalities: Ultrasound Manual Therapy Manual Therapy: Other (comment) Myofascial Release: to right gastroc and soleus to decreas tightness  Other Manual Therapy: Strain counter strain to right calf musculature to decrease pain and spasms Ultrasound Ultrasound Location: Right achilles tendon Ultrasound Parameters: 3MHz 0.8w/cm2 pulsed x 8' Ultrasound Goals: Pain  Physical Therapy Assessment and Plan PT Assessment and Plan Clinical Impression Statement: Continued with stretching, ultrasound and manual techniques secondary to decreased tightness and pain after last session. Decreased tightness and spasms noted in right gastroc this session. Strain counter strain completed to posterior calf musculature to improve blood flow and decrease pain/spasms.  PT Plan: Continue with manual, non-thermal ultrasound and stretching per PT POC.     Problem List Patient Active Problem List   Diagnosis Date Noted  . Tendonitis, Achilles, right 11/23/2013  . Achilles tendonitis, bilateral 11/23/2013    PT - End of Session Activity Tolerance: Patient tolerated treatment well General Behavior During Therapy: [facility] tasks assessed/performed  Seth Bakeebekah Jezabel Lecker, PTA  11/29/2013, 10:02 AM

## 2013-12-01 ENCOUNTER — Ambulatory Visit (HOSPITAL_COMMUNITY)
Admission: RE | Admit: 2013-12-01 | Discharge: 2013-12-01 | Disposition: A | Discharge status: Home / Self Care | Payer: BC Managed Care – PPO | Source: Ambulatory Visit | Attending: Podiatry | Admitting: Podiatry

## 2013-12-01 DIAGNOSIS — M7661 Achilles tendinitis, right leg: Secondary | ICD-10-CM

## 2013-12-01 DIAGNOSIS — M7662 Achilles tendinitis, left leg: Secondary | ICD-10-CM

## 2013-12-01 NOTE — Progress Notes (Signed)
Physical Therapy Treatment Patient Details  Name: Eric Dougherty MRN: [id number] Date of Birth: 17-May-1951  Today's Date: 12/01/2013 Time: 4825-0037 PT Time Calculation (min): 69 min 0845 - 0855 ultrasound  0900- 0930 manual  Visit#: 4 of 12  Re-eval: 12/23/13 Assessment Diagnosis: achilles tendonitis  Surgical Date: 09/02/13 Next MD Visit: Dr. Elby Showers   Authorization: BCBS  Authorization Time Period:    Authorization Visit#: 4 of 12   Subjective: Symptoms/Limitations Symptoms: recieved shoe inserts, those are working well, left fot still feels good, right  mild medial heel pain , pain 2/10 at max upon entering clinic      Exercise/Treatment      Ankle Stretches Slant Board Stretch: 30 seconds;2 reps Other Stretch: R runner calf stretch 30 sec  Aerobic Exercises   Machines for Strengthening   Ankle Plyometrics   Ankle Exercises - Standing   Ankle Exercises - Seated   Ankle Exercises - Supine   Ankle Exercises - Sidelying   Modalities Modalities: Ultrasound Manual Therapy Manual Therapy: Joint mobilization Joint Mobilization: calcaneal mobs  x 3 min  Myofascial Release: right gastroc/soleus x 21 min , includintg 3 min ice massage   medial calcaneous  Other Manual Therapy: manual  gastroc stretch 30 sec x 3  Ultrasound Ultrasound Location: right achilles medial calcaneal  Ultrasound Parameters: 3 MHZ. 1.0 w/cm3 pulsed x 10 min   Physical Therapy Assessment and Plan PT Assessment and Plan Clinical Impression Statement: pain decreasing, walking FWB outsdie of boot to therapy, weaning from boot PT Plan: Continue with manual, non-thermal ultrasound and stretching per PT POC., trial 5 -10 min ambulation outside boot for progression back into walking program     Goals Home Exercise Program Pt/caregiver will Perform Home Exercise Program: Independently PT Goal: Perform Home Exercise Program - Progress: Progressing toward goal PT Short Term Goals Time to  Complete Short Term Goals: 2 weeks PT Short Term Goal 1: walking level terrain out of CAM boot 10 min without  symptoms of pain  PT Short Term Goal 1 - Progress: Progressing toward goal PT Long Term Goals Time to Complete Long Term Goals: 4 weeks PT Long Term Goal 1: ambulate 1.5  miles without symptoms  PT Long Term Goal 1 - Progress: Not met PT Long Term Goal 2: deny tenderness with palpation at right distal achilles tendon  PT Long Term Goal 2 - Progress: Progressing toward goal Long Term Goal 3: understand basic walking program and slow increase in walking distance  Long Term Goal 3 Progress: Progressing toward goal  Problem List Patient Active Problem List   Diagnosis Date Noted  . Tendonitis, Achilles, right 11/23/2013  . Achilles tendonitis, bilateral 11/23/2013    PT - End of Session Activity Tolerance: Patient tolerated treatment well General Behavior During Therapy: [facility] for tasks assessed/performed  GP    , 12/01/2013, 9:48 AM

## 2013-12-04 ENCOUNTER — Ambulatory Visit (HOSPITAL_COMMUNITY)
Admission: RE | Admit: 2013-12-04 | Discharge: 2013-12-04 | Disposition: A | Discharge status: Home / Self Care | Payer: BC Managed Care – PPO | Source: Ambulatory Visit | Attending: Family Medicine | Admitting: Family Medicine

## 2013-12-04 DIAGNOSIS — E119 Type 2 diabetes mellitus without complications: Secondary | ICD-10-CM | POA: Insufficient documentation

## 2013-12-04 DIAGNOSIS — R269 Unspecified abnormalities of gait and mobility: Secondary | ICD-10-CM | POA: Insufficient documentation

## 2013-12-04 DIAGNOSIS — IMO0001 Reserved for inherently not codable concepts without codable children: Secondary | ICD-10-CM | POA: Insufficient documentation

## 2013-12-04 DIAGNOSIS — I10 Essential (primary) hypertension: Secondary | ICD-10-CM | POA: Insufficient documentation

## 2013-12-04 DIAGNOSIS — M25579 Pain in unspecified ankle and joints of unspecified foot: Secondary | ICD-10-CM | POA: Insufficient documentation

## 2013-12-04 NOTE — Progress Notes (Signed)
Physical Therapy Treatment Patient Details  Name: Eric ChiquitoCharles W Dougherty MRN: [id number] Date of Birth: 08/09/1951  Today's Date: 12/04/2013 Time: 7829-56210849-0922 PT Time Calculation (min): 33 min  Visit#: 5 of 12  Re-eval: 12/23/13 Authorization: BCBS  Authorization Visit#: 5 of 12  Charges:  therex [phone] (8'), [place] [phone] (8'), manual [phone] (15')  Subjective: Symptoms/Limitations Symptoms: Pt states therapy has really helped.  States he is currently without pain. Pain Assessment Currently in Pain?: No/denies   Exercise/Treatments Ankle Stretches Plantar Fascia Stretch: 3 reps;30 seconds Slant Board Stretch: 30 seconds;3 reps   Modalities Modalities: Ultrasound Manual Therapy Manual Therapy: Other (comment) Other Manual Therapy: Rt gastroc/soleus with joint mobs and manual gastroc stretch Ultrasound Ultrasound Location: Rt achilles medial calcaneal Ultrasound Parameters: 3 MHZ 1 w/cm2 pulsed X 8 minutes Ultrasound Goals: Pain  Physical Therapy Assessment and Plan PT Assessment and Plan Clinical Impression Statement: Overall pain reduction, walking 75% without boot and wearing orthotics in shoes.  Encouraged to begin walking program wtih 30 minutes and progressing.   Progressing well.  returns to MD next week with anticipated discharge from therapy. PT Plan: Continue POC; anticipate discharge next week.       Problem List Patient Active Problem List   Diagnosis Date Noted  . Tendonitis, Achilles, right 11/23/2013  . Achilles tendonitis, bilateral 11/23/2013    PT - End of Session Activity Tolerance: Patient tolerated treatment well General Behavior During Therapy: [facility] assessed/performed   Lurena NidaAmy B Riely Baskett, PTA/CLT 12/04/2013, 9:35 AM

## 2013-12-06 ENCOUNTER — Ambulatory Visit (HOSPITAL_COMMUNITY)
Admission: RE | Admit: 2013-12-06 | Discharge: 2013-12-06 | Disposition: A | Discharge status: Home / Self Care | Payer: BC Managed Care – PPO | Source: Ambulatory Visit | Attending: Family Medicine | Admitting: Family Medicine

## 2013-12-06 DIAGNOSIS — M7661 Achilles tendinitis, right leg: Secondary | ICD-10-CM

## 2013-12-06 DIAGNOSIS — M7662 Achilles tendinitis, left leg: Secondary | ICD-10-CM

## 2013-12-06 NOTE — Progress Notes (Signed)
Physical Therapy Treatment Patient Details  Name: Eric Dougherty MRN: [id number] Date of Birth: [birth year]  Today's Date: 12/06/2013 Time: 0272-5366 PT Time Calculation (min): 49 min 0845- 4403 U/S  0855- 0910 manual  0910 - 4742 TE  Visit#: 6 of 12  Re-eval: 12/23/13 Assessment Diagnosis: achilles tendonitis  Surgical Date: 09/02/13 Next MD Visit: Dr. Elby Showers   Authorization: BCBS  Authorization Time Period:    Authorization Visit#:   of     Subjective: Symptoms/Limitations Symptoms: Pt states therapy has really helped.  States he is currently without pain.  Precautions/Restrictions     Exercise/Treatments Mobility/Balance        Ankle Stretches Plantar Fascia Stretch: 3 reps;30 seconds Slant Board Stretch: 30 seconds;3 reps Other Stretch: R runner calf stretch 30 sec  Aerobic Exercises Tread Mill: 1.2   5 min for progression into walking program  Machines for Strengthening   Ankle Plyometrics   Ankle Exercises - Standing SLS: L and R 10 seconds 3 trials  intermittent hand support of one  on airex balance pad  Heel Raises: 10 reps;Limitations Heel Raises Limitations: Both LEGs  Other Standing Ankle Exercises: R LE eccetric calf raises 10 x  Ankle Exercises - Seated   Ankle Exercises - Supine   Ankle Exercises - Sidelying   Modalities Modalities: Ultrasound Manual Therapy Manual Therapy: Joint mobilization Joint Mobilization: calcaneal mobs all directions  Myofascial Release: right gastroc with roller  Other Manual Therapy: manual gastroc stretch 30 sec x 2  Ultrasound Ultrasound Location: rt achilles medial calcaneal  Ultrasound Parameters: 3 mhz   1.2 w cm3 pulsed x 10 min  Ultrasound Goals: Pain  Physical Therapy Assessment and Plan PT Assessment and Plan Clinical Impression Statement: progressing into walking program without symptoms  PT Plan: Continue POC;  Continue single leg balance/stability   Goals Home Exercise Program PT Goal: Perform  Home Exercise Program - Progress: Met PT Short Term Goals PT Short Term Goal 1: walking level terrain out of CAM boot 10 min without  symptoms of pain  PT Short Term Goal 1 - Progress: Progressing toward goal PT Long Term Goals PT Long Term Goal 1: ambulate 1.5  miles without symptoms  PT Long Term Goal 1 - Progress: Progressing toward goal PT Long Term Goal 2: deny tenderness with palpation at right distal achilles tendon  PT Long Term Goal 2 - Progress: Partly met Long Term Goal 3: understand basic walking program and slow increase in walking distance  Long Term Goal 3 Progress: Met  Problem List Patient Active Problem List   Diagnosis Date Noted  . Tendonitis, Achilles, right 11/23/2013  . Achilles tendonitis, bilateral 11/23/2013    PT - End of Session Activity Tolerance: Patient tolerated treatment well  GP    , 12/06/2013, 9:46 AM

## 2013-12-08 ENCOUNTER — Ambulatory Visit (HOSPITAL_COMMUNITY)
Admission: RE | Admit: 2013-12-08 | Discharge: 2013-12-08 | Disposition: A | Discharge status: Home / Self Care | Payer: BC Managed Care – PPO | Source: Ambulatory Visit | Attending: Family Medicine | Admitting: Family Medicine

## 2013-12-08 DIAGNOSIS — M7661 Achilles tendinitis, right leg: Secondary | ICD-10-CM

## 2013-12-08 DIAGNOSIS — M7662 Achilles tendinitis, left leg: Secondary | ICD-10-CM

## 2013-12-08 NOTE — Progress Notes (Signed)
Physical Therapy Treatment Patient Details  Name: Eric Dougherty MRN: [id number] Date of Birth: [birth year]  Today's Date: 12/08/2013 Time: 3295-1884 PT Time Calculation (min): 20 min 0845- 59 TE  Visit#: 7 of 12  Re-eval: 12/23/13 Assessment Diagnosis: achilles tendonitis  Surgical Date: 09/02/13 Next MD Visit: Dr. Elby Showers   Authorization: BCBS  Authorization Time Period:    Authorization Visit#: 7 of     Subjective: Symptoms/Limitations Symptoms: starting to walk 1/4 mile , does hace discomfort ifg walks without inserts and good footwear  Pertinent History: 63 year old male, referred for achilles tendonitis , has used CAM walker, night splints, injection , initially had swelling  Pain Assessment Currently in Pain?: No/denies  Precautions/Restrictions     Exercise/Treatments Mobility/Balance        Ankle Stretches Plantar Fascia Stretch: 3 reps;30 seconds Gastroc Stretch: 3 reps;30 seconds     Slant board  Other Stretch: R runner calf stretch 30 sec , 3 reps  Aerobic Exercises Tread Mill: 1.2    10 min for progression into walking program  Machines for Strengthening   Ankle Plyometrics   Ankle Exercises - Standing SLS: L and R  20 seconds 3 trials  intermittent hand support of one  on airex balance pad  (airex balance pad ) Heel Raises: 10 reps Heel Raises Limitations: Both LE  2 sets, then 15 reps on airex balance pad  Heel Walk (Round Trip): red line 2 reps  Toe Walk (Round Trip): red line 2 reps  Braiding (Round Trip): red line 2 reps  Other Standing Ankle Exercises: R LE eccetric calf raises 10 x , 2 sets  Ankle Exercises - Seated Other Seated Ankle Exercises: R soleus eccentrics 8# 20x  Ankle Exercises - Supine   Ankle Exercises - Sidelying      Physical Therapy Assessment and Plan PT Assessment and Plan Clinical Impression Statement: strong progression towards goals, focusng on eccentric gastroc  and soleus and ankle stability  , very knowledgeable  of ankle /foot stretches  PT Plan: forward lunges, tandem balacne on airex blacne pad, balance beam tandem walking     Goals Home Exercise Program Pt/caregiver will Perform Home Exercise Program: Independently PT Goal: Perform Home Exercise Program - Progress: Met PT Short Term Goals Time to Complete Short Term Goals: 2 weeks PT Short Term Goal 1: walking level terrain out of CAM boot 10 min without  symptoms of pain  PT Short Term Goal 1 - Progress: Met PT Long Term Goals Time to Complete Long Term Goals: 4 weeks PT Long Term Goal 1: ambulate 1.5  miles without symptoms  PT Long Term Goal 1 - Progress: Progressing toward goal PT Long Term Goal 2: deny tenderness with palpation at right distal achilles tendon  PT Long Term Goal 2 - Progress: Partly met Long Term Goal 3: understand basic walking program and slow increase in walking distance  Long Term Goal 3 Progress: Met  Problem List Patient Active Problem List   Diagnosis Date Noted  . Tendonitis, Achilles, right 11/23/2013  . Achilles tendonitis, bilateral 11/23/2013    PT Plan of Care PT Patient Instructions: walk 1/2 to 3/4 mile , avoid pain, level terrain   GP    Eric Dougherty 12/08/2013, 9:19 AM

## 2013-12-11 ENCOUNTER — Ambulatory Visit (HOSPITAL_COMMUNITY)
Admission: RE | Admit: 2013-12-11 | Discharge: 2013-12-11 | Disposition: A | Discharge status: Home / Self Care | Payer: BC Managed Care – PPO | Source: Ambulatory Visit | Attending: Family Medicine | Admitting: Family Medicine

## 2013-12-11 DIAGNOSIS — M7662 Achilles tendinitis, left leg: Secondary | ICD-10-CM

## 2013-12-11 DIAGNOSIS — M7661 Achilles tendinitis, right leg: Secondary | ICD-10-CM

## 2013-12-11 NOTE — Progress Notes (Signed)
Physical Therapy Re-evaluation/Treatment/Discharge summary  Patient Details  Name: Eric Dougherty MRN: [id number] Date of Birth: 10-25-1951  Today's Date: 12/11/2013 Time: 3570-1779 PT Time Calculation (min): 40 min Charge MMT/ROM Measurement 3903-0092, NMR 3300-7622, Gait [phone]              Visit#: 8 of 12  Re-eval: 12/23/13 Assessment Diagnosis: achilles tendonitis  Surgical Date: 09/02/13 Next MD Visit: Dr. Elby Showers  Prior Therapy: no   Authorization: BCBS    Authorization Time Period:    Authorization Visit#: 8 of 12   Subjective Symptoms/Limitations Symptoms: Pt reported he feels ready for discharge today, was able to stand for 8 hours plus on Saturday doing lots of walking with no pain and no swelling.  Goes to MD tomorrow, Pain Assessment Currently in Pain?: No/denies  Precautions/Restrictions  Restrictions Weight Bearing Restrictions: No  Cognition/Observation Observation/Other Assessments Observations: Normal tennis shoes (was ambulating with CAM boot R foot )  Sensation/Coordination/Flexibility/Functional Tests Functional Tests Functional Tests: foto mobility 98/2 (foto mobility 60)  Assessment RLE AROM (degrees) Right Ankle Dorsiflexion: 23 (was 10) Right Ankle Plantar Flexion: 45 (was 40) Right Ankle Inversion: 35 (was 25) Right Ankle Eversion: 25 (was 15)  Exercise/Treatments Aerobic Exercises Tread Mill: 2.0 x 8 minutes Ankle Exercises - Standing SLS: Lt 23", Rt 25" max of 3 Other Standing Ankle Exercises: Forward lunges on balance beam 10x each LE Other Standing Ankle Exercises: Tandem and retro gait 2Rt  Physical Therapy Assessment and Plan PT Assessment and Plan Clinical Impression Statement: Re-eval complete following discussion with pt about great progress prior MD apt tomorrow with the following findings:  Pt independent with HEP and able to demonstrate appropriate techniques with all exercies.  Pt has met all STG and LTGs.  Pt with  strength and AROM WFL.  Pt ambualtes with no gait impairments out of CAM boot.  Pt reported ability to stand and walk for 8 hours this past weekend with no reoprts of increased pain of edema. PT Plan: D/C to HEP per all goal met.    Goals Home Exercise Program Pt/caregiver will Perform Home Exercise Program: Independently PT Short Term Goals Time to Complete Short Term Goals: 2 weeks PT Short Term Goal 1: walking level terrain out of CAM boot 10 min without  symptoms of pain  PT Long Term Goals Time to Complete Long Term Goals: 4 weeks PT Long Term Goal 1: ambulate 1.5  miles without symptoms  PT Long Term Goal 2: deny tenderness with palpation at right distal achilles tendon  Long Term Goal 3: understand basic walking program and slow increase in walking distance   Problem List Patient Active Problem List   Diagnosis Date Noted  . Tendonitis, Achilles, right 11/23/2013  . Achilles tendonitis, bilateral 11/23/2013    PT - End of Session Activity Tolerance: Patient tolerated treatment well General Behavior During Therapy: [facility] for tasks assessed/performed  GP    Aldona Lento 12/11/2013, 4:46 PM  Physician Documentation Your signature is required to indicate approval of the treatment plan as stated above.  Please sign and either send electronically or make a copy of this report for your files and return this physician signed original.   Please mark one 1.__approve of plan  2. ___approve of plan with the following conditions.   ______________________________  _____________________ Physician Signature                                                                                                             Date

## 2013-12-13 ENCOUNTER — Ambulatory Visit (HOSPITAL_COMMUNITY): Payer: BC Managed Care – PPO | Admitting: Physical Therapy

## 2013-12-15 ENCOUNTER — Ambulatory Visit (HOSPITAL_COMMUNITY): Payer: BC Managed Care – PPO | Admitting: Physical Therapy

## 2013-12-18 ENCOUNTER — Ambulatory Visit (HOSPITAL_COMMUNITY): Payer: BC Managed Care – PPO | Admitting: Physical Therapy

## 2013-12-20 ENCOUNTER — Ambulatory Visit (HOSPITAL_COMMUNITY): Payer: BC Managed Care – PPO | Admitting: *Deleted

## 2013-12-22 ENCOUNTER — Ambulatory Visit (HOSPITAL_COMMUNITY): Payer: BC Managed Care – PPO | Admitting: Physical Therapy

## 2014-06-05 ENCOUNTER — Ambulatory Visit (INDEPENDENT_AMBULATORY_CARE_PROVIDER_SITE_OTHER): Payer: BC Managed Care – PPO | Admitting: Urology

## 2014-06-05 DIAGNOSIS — N200 Calculus of kidney: Secondary | ICD-10-CM

## 2014-06-05 DIAGNOSIS — N529 Male erectile dysfunction, unspecified: Secondary | ICD-10-CM

## 2014-12-30 ENCOUNTER — Emergency Department (HOSPITAL_COMMUNITY): Payer: BC Managed Care – PPO

## 2014-12-30 ENCOUNTER — Encounter (HOSPITAL_COMMUNITY): Payer: Self-pay | Admitting: Emergency Medicine

## 2014-12-30 ENCOUNTER — Emergency Department (HOSPITAL_COMMUNITY)
Admission: EM | Admit: 2014-12-30 | Discharge: 2014-12-30 | Disposition: A | Discharge status: Home / Self Care | Payer: BC Managed Care – PPO | Attending: Emergency Medicine | Admitting: Emergency Medicine

## 2014-12-30 DIAGNOSIS — Z79899 Other long term (current) drug therapy: Secondary | ICD-10-CM | POA: Diagnosis not present

## 2014-12-30 DIAGNOSIS — M545 Low back pain: Secondary | ICD-10-CM | POA: Diagnosis present

## 2014-12-30 DIAGNOSIS — M5416 Radiculopathy, lumbar region: Secondary | ICD-10-CM | POA: Insufficient documentation

## 2014-12-30 DIAGNOSIS — I10 Essential (primary) hypertension: Secondary | ICD-10-CM | POA: Insufficient documentation

## 2014-12-30 DIAGNOSIS — E119 Type 2 diabetes mellitus without complications: Secondary | ICD-10-CM | POA: Diagnosis not present

## 2014-12-30 MED ORDER — OXYCODONE-ACETAMINOPHEN 5-325 MG PO TABS
1.0000 | ORAL_TABLET | Freq: Once | ORAL | Status: AC
  Administered 2014-12-30: 1 via ORAL
  Filled 2014-12-30: qty 1

## 2014-12-30 MED ORDER — DICLOFENAC SODIUM 75 MG PO TBEC: 75.0000 mg | DELAYED_RELEASE_TABLET | Freq: Two times a day (BID) | ORAL | Status: DC

## 2014-12-30 MED ORDER — OXYCODONE-ACETAMINOPHEN 5-325 MG PO TABS: 1.0000 | ORAL_TABLET | ORAL | Status: DC | PRN

## 2014-12-30 MED ORDER — DIAZEPAM 5 MG PO TABS
5.0000 mg | ORAL_TABLET | Freq: Once | ORAL | Status: AC
  Administered 2014-12-30: 5 mg via ORAL
  Filled 2014-12-30: qty 1

## 2014-12-30 MED ORDER — CYCLOBENZAPRINE HCL 10 MG PO TABS: 10.0000 mg | ORAL_TABLET | Freq: Three times a day (TID) | ORAL | Status: DC | PRN

## 2014-12-30 NOTE — ED Provider Notes (Signed)
CSN: [id number]     Arrival date & time 12/30/14  1007 History   First MD Initiated Contact with Patient 12/30/14 1020     Chief Complaint  Patient presents with  . Hip Pain  . Back Pain     (Consider location/radiation/quality/duration/timing/severity/associated sxs/prior Treatment) HPI  Eric Dougherty is a 64 y.o. male who presents to the Emergency Department complaining of right hip and low back pain for 2 weeks but states the pain has worsened the last three days.  He states that he shoveled snow and felt a "twinge" of pain at that time but not significant pain.  For three days now he reports pain has intensified and now radiating into his right buttock and into the back side of his leg and stops at the level of his knee.  PAin is worse with movement and improves at rest.  He has tried aleve with mild relief.  He reports intermittent "tingling" to his thigh.  He denies abdominal pain, vomiting, fever, chills, numbness or weakness to his lower extremities, or urine or bowel changes.      Past Medical History  Diagnosis Date  . Diabetes mellitus     resolution of symptoms  . Hypertension     no longer on meds   Past Surgical History  Procedure Laterality Date  . Tonsillectomy    . Hernia repair    . Hemorrhoid surgery    . Lithotripsy     Family History  Problem Relation Age of Onset  . Cancer Father   . Hypothyroidism Brother   . Glaucoma Brother   . Osteoarthritis Other   . Cancer Other    History  Substance Use Topics  . Smoking status: Never Smoker   . Smokeless tobacco: Former NeurosurgeonUser  . Alcohol Use: 4.2 oz/week    7 Standard drinks or equivalent per week    Review of Systems  Constitutional: Negative for fever.  Respiratory: Negative for shortness of breath.   Gastrointestinal: Negative for vomiting, abdominal pain and constipation.  Genitourinary: Negative for dysuria, hematuria, flank pain, decreased urine volume and difficulty urinating.  Musculoskeletal:  Positive for back pain. Negative for joint swelling.  Skin: Negative for rash.  Neurological: Negative for weakness and numbness.  All other systems reviewed and are negative.     Allergies  Review of patient's allergies indicates no known allergies.  Home Medications   Prior to Admission medications   Medication Sig Start Date End Date Taking? Authorizing Provider  hydroxypropyl methylcellulose (ISOPTO TEARS) 2.5 % ophthalmic solution Place 1 drop into both eyes daily as needed. Dry Eyes     Historical Provider, MD  ibuprofen (ADVIL,MOTRIN) 800 MG tablet Take 800 mg by mouth every 8 (eight) hours as needed. Pain     Historical Provider, MD  lisinopril (PRINIVIL,ZESTRIL) 10 MG tablet Take 10 mg by mouth daily.      Historical Provider, MD  metFORMIN (GLUCOPHAGE) 500 MG tablet Take 1,000 mg by mouth 2 (two) times daily.      Historical Provider, MD   BP 128/73 mmHg  Pulse 80  Temp(Src) 97.6 F (36.4 C) (Oral)  Resp 20  Ht 5\' 8"  (1.727 m)  Wt 250 lb (113.399 kg)  BMI 38.02 kg/m2  SpO2 95% Physical Exam  Constitutional: He is oriented to person, place, and time. He appears well-developed and well-nourished. No distress.  HENT:  Head: Normocephalic and atraumatic.  Neck: Normal range of motion. Neck supple.  Cardiovascular: Normal rate, regular rhythm,  normal heart sounds and intact distal pulses.   No murmur heard. Pulmonary/Chest: Effort normal and breath sounds normal. No respiratory distress.  Abdominal: Soft. He exhibits no distension. There is no tenderness. There is no rebound and no guarding.  Musculoskeletal: He exhibits tenderness. He exhibits no edema.       Lumbar back: He exhibits tenderness and pain. He exhibits normal range of motion, no swelling, no deformity, no laceration and normal pulse.  ttp of the right lumbar paraspinal muscles and SI joint.  No spinal tenderness.  DP pulses are brisk and symmetrical.  Distal sensation intact.  Hip Flexors/Extensors are  intact.  Pt has 5/5 strength against resistance of bilateral lower extremities.     Neurological: He is alert and oriented to person, place, and time. He has normal strength. No sensory deficit. He exhibits normal muscle tone. Coordination and gait normal.  Reflex Scores:      Patellar reflexes are 2+ on the right side and 2+ on the left side.      Achilles reflexes are 2+ on the right side and 2+ on the left side. Skin: Skin is warm and dry. No rash noted.  Nursing note and vitals reviewed.   ED Course  Procedures (including critical care time) Labs Review Labs Reviewed - No data to display  Imaging Review Dg Lumbar Spine Complete  12/30/2014   CLINICAL DATA:  64 year old male with right lumbar spine pain, and right hip pain. No known injury. Pain is been progressive over the last 3 days  EXAM: LUMBAR SPINE - COMPLETE 4+ VIEW  COMPARISON:  Prior lumbar spine radiographs 05/30/2013  FINDINGS: Helical staples in the midline consistent with prior laparoscopic ventral hernia repair. Stable radiopaque phleboliths projecting over the left anatomic pelvis. Visualized bowel gas pattern is unremarkable. No abnormal calcifications. Multi level spondylosis with prominent anterior osteophytes. No evidence of acute fracture or malalignment. Vertebral body heights are maintained. Mild degenerative disc disease as evidenced by loss of disc space height at L4-L5 and L5-S1 and calcification of the posterior annulus fibrosis at L2-L3, L3-L4, L4-L5. Facet arthropathy is present at L4-L5 and L5-S1. Atherosclerotic calcifications of the aortoiliac system. Normal bony mineralization. No lytic or blastic osseous lesion. No spondylolysis or listhesis.  IMPRESSION: 1. 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 2. Prominent anterior endplate osteophyte formation. 3. Aortic atherosclerosis. 4. Surgical changes of prior laparoscopic ventral hernia repair.   Electronically Signed   By: Malachy Moan M.D.   On: 12/30/2014 12:49     EKG Interpretation None      MDM   Final diagnoses:  Lumbar radiculopathy    Pt is well appearing, no focal neuro deficits.  No concerning sx's for emergent neurological or infectious process.  Pt agrees to symptomatic tx and close f/u with his PMD if sx's are not improving.    Pain improved after medication.  Appears stable for d/c     L. Trisha Mangle, PA-C 12/31/14 2057  Benny Lennert, MD 01/03/15 [phone]

## 2014-12-30 NOTE — ED Notes (Signed)
Pt reports R hip and low back pain, onset around the time of the last snow but worsening over the past 3 days. No known injury.

## 2014-12-30 NOTE — Discharge Instructions (Signed)
Back Pain, Adult Back pain is very common. The pain often gets better over time. The cause of back pain is usually not dangerous. Most people can learn to manage their back pain on their own.  HOME CARE   Stay active. Start with short walks on flat ground if you can. Try to walk farther each day.  Do not sit, drive, or stand in one place for more than 30 minutes. Do not stay in bed.  Do not avoid exercise or work. Activity can help your back heal faster.  Be careful when you bend or lift an object. Bend at your knees, keep the object close to you, and do not twist.  Sleep on a firm mattress. Lie on your side, and bend your knees. If you lie on your back, put a pillow under your knees.  Only take medicines as told by your doctor.  Put ice on the injured area.  Put ice in a plastic bag.  Place a towel between your skin and the bag.  Leave the ice on for 15-20 minutes, 03-04 times a day for the first 2 to 3 days. After that, you can switch between ice and heat packs.  Ask your doctor about back exercises or massage.  Avoid feeling anxious or stressed. Find good ways to deal with stress, such as exercise. GET HELP RIGHT AWAY IF:   Your pain does not go away with rest or medicine.  Your pain does not go away in 1 week.  You have new problems.  You do not feel well.  The pain spreads into your legs.  You cannot control when you poop (bowel movement) or pee (urinate).  Your arms or legs feel weak or lose feeling (numbness).  You feel sick to your stomach (nauseous) or throw up (vomit).  You have belly (abdominal) pain.  You feel like you may pass out (faint). MAKE SURE YOU:   Understand these instructions.  Will watch your condition.  Will get help right away if you are not doing well or get worse. Document Released: 04/06/2008 Document Revised: 01/11/2012 Document Reviewed: 02/20/2014 [facility] Patient Information 2015 [place], [place]. This information is not intended  to replace advice given to you by your health care provider. Make sure you discuss any questions you have with your health care provider.  Lumbosacral Radiculopathy Lumbosacral radiculopathy is a pinched nerve or nerves in the low back (lumbosacral area). When this happens you may have weakness in your legs and may not be able to stand on your toes. You may have pain going down into your legs. There may be difficulties with walking normally. There are many causes of this problem. Sometimes this may happen from an injury, or simply from arthritis or boney problems. It may also be caused by other illnesses such as diabetes. If there is no improvement after treatment, further studies may be done to find the exact cause. DIAGNOSIS  X-rays may be needed if the problems become long standing. Electromyograms may be done. This study is one in which the working of nerves and muscles is studied. HOME CARE INSTRUCTIONS   Applications of ice packs may be helpful. Ice can be used in a plastic bag with a towel around it to prevent frostbite to skin. This may be used every 2 hours for 20 to 30 minutes, or as needed, while awake, or as directed by your caregiver.  Only take over-the-counter or prescription medicines for pain, discomfort, or fever as directed by your caregiver.  If physical therapy was prescribed, follow your caregiver's directions. SEEK IMMEDIATE MEDICAL CARE IF:   You have pain not controlled with medications.  You seem to be getting worse rather than better.  You develop increasing weakness in your legs.  You develop loss of bowel or bladder control.  You have difficulty with walking or balance, or develop clumsiness in the use of your legs.  You have a fever. MAKE SURE YOU:   Understand these instructions.  Will watch your condition.  Will get help right away if you are not doing well or get worse. Document Released: 10/19/2005 Document Revised: 01/11/2012 Document Reviewed:  06/08/2008 [facility] Patient Information 2015 [place], [place]. This information is not intended to replace advice given to you by your health care provider. Make sure you discuss any questions you have with your health care provider.

## 2015-03-05 ENCOUNTER — Ambulatory Visit (INDEPENDENT_AMBULATORY_CARE_PROVIDER_SITE_OTHER): Payer: BC Managed Care – PPO | Admitting: Urology

## 2015-03-05 DIAGNOSIS — N5201 Erectile dysfunction due to arterial insufficiency: Secondary | ICD-10-CM | POA: Diagnosis not present

## 2015-03-05 DIAGNOSIS — R361 Hematospermia: Secondary | ICD-10-CM | POA: Diagnosis not present

## 2015-05-06 ENCOUNTER — Emergency Department (HOSPITAL_COMMUNITY)
Admission: EM | Admit: 2015-05-06 | Discharge: 2015-05-06 | Disposition: A | Discharge status: Home / Self Care | Payer: BC Managed Care – PPO | Attending: Emergency Medicine | Admitting: Emergency Medicine

## 2015-05-06 ENCOUNTER — Emergency Department (HOSPITAL_COMMUNITY): Payer: BC Managed Care – PPO

## 2015-05-06 ENCOUNTER — Encounter (HOSPITAL_COMMUNITY): Payer: Self-pay | Admitting: *Deleted

## 2015-05-06 DIAGNOSIS — Z79899 Other long term (current) drug therapy: Secondary | ICD-10-CM | POA: Insufficient documentation

## 2015-05-06 DIAGNOSIS — Z791 Long term (current) use of non-steroidal anti-inflammatories (NSAID): Secondary | ICD-10-CM | POA: Insufficient documentation

## 2015-05-06 DIAGNOSIS — M5431 Sciatica, right side: Secondary | ICD-10-CM | POA: Insufficient documentation

## 2015-05-06 DIAGNOSIS — E119 Type 2 diabetes mellitus without complications: Secondary | ICD-10-CM | POA: Insufficient documentation

## 2015-05-06 DIAGNOSIS — M545 Low back pain: Secondary | ICD-10-CM | POA: Diagnosis present

## 2015-05-06 DIAGNOSIS — I10 Essential (primary) hypertension: Secondary | ICD-10-CM | POA: Diagnosis not present

## 2015-05-06 DIAGNOSIS — Z7982 Long term (current) use of aspirin: Secondary | ICD-10-CM | POA: Insufficient documentation

## 2015-05-06 DIAGNOSIS — Z87891 Personal history of nicotine dependence: Secondary | ICD-10-CM | POA: Diagnosis not present

## 2015-05-06 DIAGNOSIS — M47896 Other spondylosis, lumbar region: Secondary | ICD-10-CM | POA: Insufficient documentation

## 2015-05-06 DIAGNOSIS — M47816 Spondylosis without myelopathy or radiculopathy, lumbar region: Secondary | ICD-10-CM

## 2015-05-06 MED ORDER — GABAPENTIN 300 MG PO CAPS
300.0000 mg | ORAL_CAPSULE | Freq: Once | ORAL | Status: AC
  Administered 2015-05-06: 300 mg via ORAL
  Filled 2015-05-06: qty 1

## 2015-05-06 MED ORDER — OXYCODONE-ACETAMINOPHEN 5-325 MG PO TABS: 1.0000 | ORAL_TABLET | Freq: Four times a day (QID) | ORAL | Status: DC | PRN

## 2015-05-06 MED ORDER — HYDROMORPHONE HCL 1 MG/ML IJ SOLN
1.0000 mg | Freq: Once | INTRAMUSCULAR | Status: AC
  Administered 2015-05-06: 1 mg via INTRAMUSCULAR
  Filled 2015-05-06: qty 1

## 2015-05-06 MED ORDER — METHYLPREDNISOLONE 4 MG PO TBPK: ORAL_TABLET | ORAL | Status: DC

## 2015-05-06 MED ORDER — KETOROLAC TROMETHAMINE 60 MG/2ML IM SOLN
60.0000 mg | Freq: Once | INTRAMUSCULAR | Status: AC
  Administered 2015-05-06: 60 mg via INTRAMUSCULAR
  Filled 2015-05-06: qty 2

## 2015-05-06 NOTE — ED Notes (Signed)
Dr Horton at bedside,  

## 2015-05-06 NOTE — ED Notes (Signed)
Pt c/o right buttock pain described as sciatica, states that the pain started a few days ago becoming worse tonight, denies any injury.

## 2015-05-06 NOTE — ED Provider Notes (Signed)
CSN: [id number]     Arrival date & time 05/06/15  0015 History   First MD Initiated Contact with Patient 05/06/15 0054     Chief Complaint  Patient presents with  . Sciatica     (Consider location/radiation/quality/duration/timing/severity/associated sxs/prior Treatment) HPI  This is a 64 year old male who presents with right lower back pain. Patient reports 2 week history of progressive right-sided back pain that radiates into his right buttock. He denies any injury. He states he has had similar symptoms in the past on the left side. He has not taken anything for the pain. Currently his pain is 9 out of 10. He states that yesterday he took a leftover muscle relaxer which may have helped some. He denies any weakness, numbness, or tingling of the lower extremities. He denies any bowel or bladder dysfunction. Patient reports history of hypertension and diabetes both of which "got better after I lost weight." He is not on any medications.  Past Medical History  Diagnosis Date  . Diabetes mellitus     resolution of symptoms  . Hypertension     no longer on meds   Past Surgical History  Procedure Laterality Date  . Tonsillectomy    . Hernia repair    . Hemorrhoid surgery    . Lithotripsy     Family History  Problem Relation Age of Onset  . Cancer Father   . Hypothyroidism Brother   . Glaucoma Brother   . Osteoarthritis Other   . Cancer Other    History  Substance Use Topics  . Smoking status: Former Games developermoker  . Smokeless tobacco: Former NeurosurgeonUser  . Alcohol Use: 4.2 oz/week    7 Standard drinks or equivalent per week    Review of Systems  Constitutional: Negative.  Negative for fever.  Respiratory: Negative.  Negative for chest tightness and shortness of breath.   Cardiovascular: Negative.  Negative for chest pain.  Gastrointestinal: Negative.  Negative for abdominal pain.  Genitourinary: Negative.  Negative for dysuria and difficulty urinating.  Musculoskeletal: Positive for  back pain. Negative for gait problem.  Neurological: Negative for weakness and numbness.  All other systems reviewed and are negative.     Allergies  Review of patient's allergies indicates no known allergies.  Home Medications   Prior to Admission medications   Medication Sig Start Date End Date Taking? Authorizing Provider  aspirin EC 81 MG tablet Take 81 mg by mouth daily.    Historical Provider, MD  cyclobenzaprine (FLEXERIL) 10 MG tablet Take 1 tablet (10 mg total) by mouth 3 (three) times daily as needed. 12/30/14   Tammy Triplett, PA-C  diclofenac (VOLTAREN) 75 MG EC tablet Take 1 tablet (75 mg total) by mouth 2 (two) times daily. Take with food 12/30/14   Tammy Triplett, PA-C  latanoprost (XALATAN) 0.005 % ophthalmic solution Place 1 drop into both eyes at bedtime. 12/05/14   Historical Provider, MD  methylPREDNISolone (MEDROL DOSEPAK) 4 MG TBPK tablet Take as instructed on packet. 05/06/15   Shon Batonourtney F , MD  Misc Natural Products (OSTEO BI-FLEX JOINT SHIELD PO) Take 1 tablet by mouth daily.    Historical Provider, MD  naproxen sodium (ANAPROX) 220 MG tablet Take 440 mg by mouth daily as needed (pain).    Historical Provider, MD  oxyCODONE-acetaminophen (PERCOCET/ROXICET) 5-325 MG per tablet Take 1-2 tablets by mouth every 6 (six) hours as needed. 05/06/15   Shon Batonourtney F , MD   BP 156/72 mmHg  Pulse 85  Temp(Src) 98.5 F (36.9  C) (Oral)  Resp 18  Ht  (1.727 m)  Wt 250 lb (113.399 kg)  BMI 38.02 kg/m2  SpO2 97% Physical Exam  Constitutional: He is oriented to person, place, and time. No distress.  Uncomfortable appearing, no acute distress  HENT:  Head: Normocephalic and atraumatic.  Cardiovascular: Normal rate, regular rhythm and normal heart sounds.   No murmur heard. Pulmonary/Chest: Effort normal and breath sounds normal. No respiratory distress. He has no wheezes.  Musculoskeletal: He exhibits no edema.  Tenderness to palpation lower lumbar spine and right  SI joint, no obvious deformities  Neurological: He is alert and oriented to person, place, and time.  5 out of 5 strength with knee extension and flexion and hip flexion, no clonus noted  Skin: Skin is warm and dry.  Psychiatric: He has a normal mood and affect.  Nursing note and vitals reviewed.   ED Course  Procedures (including critical care time) Labs Review Labs Reviewed - No data to display  Imaging Review Dg Lumbar Spine Complete  05/06/2015   CLINICAL DATA:  Right-sided lower back pain.  Sciatica symptoms.  EXAM: LUMBAR SPINE - COMPLETE 4+ VIEW  COMPARISON:  12/30/2014  FINDINGS: Diffuse bulky spondylotic endplate spurring without focal or notable disc narrowing. Lower lumbar facet arthropathy with overgrowth and sclerosis greatest at L5-S1. No evidence of acute fracture, focal bone lesion, or endplate erosion.  Abdominal aortic atherosclerosis.  IMPRESSION: 1. Diffuse spondylosis with lower lumbar facet arthropathy. 2. No acute finding or change from February 2016.   Electronically Signed   By: Marnee Spring M.D.   On: 05/06/2015 03:00     EKG Interpretation None      MDM   Final diagnoses:  Lumbar spondylosis, unspecified spinal osteoarthritis  Sciatica, right    Patient presents with back pain that radiates into his right buttock. Neurologically intact. No signs or symptoms of cauda equina.  X-rays notable for diffuse spondylosis and arthropathy. Patient given IM Dilaudid and Toradol as well as gabapentin. On recheck, pain is now 4 out of 10.  Discuss with patient conservative management at home with Medrol Dosepak, pain medicine, and follow-up with his primary physician. Patient may benefit from physical therapy. He will also be given referral for neurosurgery. If pain persists he may need an outpatient MRI. Patient was instructed if he has any new or worsening weakness, numbness, pain, difficulty with his bowel or bladder, he should be reevaluated immediately.  After  history, exam, and medical workup I feel the patient has been appropriately medically screened and is safe for discharge home. Pertinent diagnoses were discussed with the patient. Patient was given return precautions.     Shon Baton, MD 05/06/15 [phone]

## 2015-05-06 NOTE — Discharge Instructions (Signed)
You were seen today for sciatic pain. Your x-ray shows spondylosis of the lower lumbar spine. He will be given pain medication and sterilized. You need to follow-up with her primary physician. He will also be given neurosurgery's contact information if pain worsens. If you develop difficulty urinating, defecating, or weakness in the lower extremity you should be reevaluated immediately.  Sciatica Sciatica is pain, weakness, numbness, or tingling along the path of the sciatic nerve. The nerve starts in the lower back and runs down the back of each leg. The nerve controls the muscles in the lower leg and in the back of the knee, while also providing sensation to the back of the thigh, lower leg, and the sole of your foot. Sciatica is a symptom of another medical condition. For instance, nerve damage or certain conditions, such as a herniated disk or bone spur on the spine, pinch or put pressure on the sciatic nerve. This causes the pain, weakness, or other sensations normally associated with sciatica. Generally, sciatica only affects one side of the body. CAUSES   Herniated or slipped disc.  Degenerative disk disease.  A pain disorder involving the narrow muscle in the buttocks (piriformis syndrome).  Pelvic injury or fracture.  Pregnancy.  Tumor (rare). SYMPTOMS  Symptoms can vary from mild to very severe. The symptoms usually travel from the low back to the buttocks and down the back of the leg. Symptoms can include:  Mild tingling or dull aches in the lower back, leg, or hip.  Numbness in the back of the calf or sole of the foot.  Burning sensations in the lower back, leg, or hip.  Sharp pains in the lower back, leg, or hip.  Leg weakness.  Severe back pain inhibiting movement. These symptoms may get worse with coughing, sneezing, laughing, or prolonged sitting or standing. Also, being overweight may worsen symptoms. DIAGNOSIS  Your caregiver will perform a physical exam to look for  common symptoms of sciatica. He or she may ask you to do certain movements or activities that would trigger sciatic nerve pain. Other tests may be performed to find the cause of the sciatica. These may include:  Blood tests.  X-rays.  Imaging tests, such as an MRI or CT scan. TREATMENT  Treatment is directed at the cause of the sciatic pain. Sometimes, treatment is not necessary and the pain and discomfort goes away on its own. If treatment is needed, your caregiver may suggest:  Over-the-counter medicines to relieve pain.  Prescription medicines, such as anti-inflammatory medicine, muscle relaxants, or narcotics.  Applying heat or ice to the painful area.  Steroid injections to lessen pain, irritation, and inflammation around the nerve.  Reducing activity during periods of pain.  Exercising and stretching to strengthen your abdomen and improve flexibility of your spine. Your caregiver may suggest losing weight if the extra weight makes the back pain worse.  Physical therapy.  Surgery to eliminate what is pressing or pinching the nerve, such as a bone spur or part of a herniated disk. HOME CARE INSTRUCTIONS   Only take over-the-counter or prescription medicines for pain or discomfort as directed by your caregiver.  Apply ice to the affected area for 20 minutes, 3-4 times a day for the first 48-72 hours. Then try heat in the same way.  Exercise, stretch, or perform your usual activities if these do not aggravate your pain.  Attend physical therapy sessions as directed by your caregiver.  Keep all follow-up appointments as directed by your caregiver.  Do not wear high heels or shoes that do not provide proper support.  Check your mattress to see if it is too soft. A firm mattress may lessen your pain and discomfort. SEEK IMMEDIATE MEDICAL CARE IF:   You lose control of your bowel or bladder (incontinence).  You have increasing weakness in the lower back, pelvis, buttocks, or  legs.  You have redness or swelling of your back.  You have a burning sensation when you urinate.  You have pain that gets worse when you lie down or awakens you at night.  Your pain is worse than you have experienced in the past.  Your pain is lasting longer than 4 weeks.  You are suddenly losing weight without reason. MAKE SURE YOU:  Understand these instructions.  Will watch your condition.  Will get help right away if you are not doing well or get worse. Document Released: 10/13/2001 Document Revised: 04/19/2012 Document Reviewed: 02/28/2012 [facility] Patient Information 2015 [place], [place]. This information is not intended to replace advice given to you by your health care provider. Make sure you discuss any questions you have with your health care provider.

## 2015-05-06 NOTE — ED Notes (Signed)
Pt c/o pain to lower back radiating down right leg; pt states the pain has gotten progressively worse today

## 2015-10-22 ENCOUNTER — Telehealth: Payer: Self-pay | Admitting: Internal Medicine

## 2015-10-22 NOTE — Telephone Encounter (Signed)
Letter in the mail 

## 2015-10-22 NOTE — Telephone Encounter (Signed)
JANUARY RECALL FOR [facility]

## 2016-02-11 ENCOUNTER — Telehealth: Payer: Self-pay

## 2016-02-11 NOTE — Telephone Encounter (Signed)
Pt had received a triage letter from [facility]. Please call [phone]

## 2016-02-11 NOTE — Telephone Encounter (Signed)
I called and triaged pt. He will call me back about some recent changes in his meds.  I will find out when his last colonoscopy was done. He was on our recall.

## 2016-02-12 ENCOUNTER — Telehealth: Payer: Self-pay

## 2016-02-12 NOTE — Telephone Encounter (Signed)
Opened in error. See phone note of 02/11/2016.

## 2016-02-12 NOTE — Telephone Encounter (Signed)
Gastroenterology Pre-Procedure Review  Request Date: 02/11/2016 Requesting Physician: RECALL  LAST COLONOSCOPY WAS 11/20/2005 AND NEXT RECOMMENDED IN 10 YEARS ( DR. Jena GaussOURK)  PATIENT REVIEW QUESTIONS: The patient responded to the following health history questions as indicated:    1. Diabetes Melitis:yes 2. Joint replacements in the past 12 months: no 3. Major health problems in the past 3 months: no 4. Has an artificial valve or MVP: no 5. Has a defibrillator: no 6. Has been advised in past to take antibiotics in advance of a procedure like teeth cleaning: no 7. Family history of colon cancer: no  8. Alcohol Use: Drinks 2-5 beers weekly 9. History of sleep apnea: no     MEDICATIONS & ALLERGIES:    Patient reports the following regarding taking any blood thinners:   Plavix? no Aspirin? YES Coumadin? no  Patient confirms/reports the following medications:  Current Outpatient Prescriptions  Medication Sig Dispense Refill  . aspirin EC 81 MG tablet Take 81 mg by mouth daily.    Marland Kitchen. latanoprost (XALATAN) 0.005 % ophthalmic solution Place 1 drop into both eyes at bedtime.    Marland Kitchen. lisinopril (PRINIVIL,ZESTRIL) 2.5 MG tablet Take 2.5 mg by mouth daily.    . metFORMIN (GLUCOPHAGE) 500 MG tablet Take by mouth 2 (two) times daily with a meal. Takes 2 tablets bid    . Misc Natural Products (OSTEO BI-FLEX JOINT SHIELD PO) Take 1 tablet by mouth daily.    . NON FORMULARY Probiotic  One tablet daily    . cyclobenzaprine (FLEXERIL) 10 MG tablet Take 1 tablet (10 mg total) by mouth 3 (three) times daily as needed. (Patient not taking: Reported on 02/11/2016) 30 tablet 0  . diclofenac (VOLTAREN) 75 MG EC tablet Take 1 tablet (75 mg total) by mouth 2 (two) times daily. Take with food (Patient not taking: Reported on 02/11/2016) 14 tablet 0  . methylPREDNISolone (MEDROL DOSEPAK) 4 MG TBPK tablet Take as instructed on packet. (Patient not taking: Reported on 02/11/2016) 21 tablet 0  . naproxen sodium (ANAPROX)  220 MG tablet Take 440 mg by mouth daily as needed (pain). Reported on 02/11/2016    . oxyCODONE-acetaminophen (PERCOCET/ROXICET) 5-325 MG per tablet Take 1-2 tablets by mouth every 6 (six) hours as needed. (Patient not taking: Reported on 02/11/2016) 20 tablet 0   No current facility-administered medications for this visit.    Patient confirms/reports the following allergies:  No Known Allergies  No orders of the defined types were placed in this encounter.    AUTHORIZATION INFORMATION Primary Insurance:   ID #:   Group #:  Pre-Cert / Auth required:  Pre-Cert / Auth #:   Secondary Insurance:   ID #:  Group #:  Pre-Cert / Auth required:  Pre-Cert / Auth #:   SCHEDULE INFORMATION: Procedure has been scheduled as follows:  Date:               Time:   Location:   This Gastroenterology Pre-Precedure Review Form is being routed to the following provider(s): R. Roetta SessionsMichael Rourk, MD

## 2016-02-12 NOTE — Telephone Encounter (Signed)
Will need OV for meds.

## 2016-02-12 NOTE — Addendum Note (Signed)
Addended by: Lavena BullionSTEWART, Iram Lundberg H on: 02/12/2016 09:22 AM   Modules accepted: Medications

## 2016-02-12 NOTE — Addendum Note (Signed)
Addended by: Lavena BullionSTEWART, Oluwaseyi Raffel H on: 02/12/2016 09:13 AM   Modules accepted: Medications

## 2016-02-13 NOTE — Telephone Encounter (Signed)
Pt is aware and has OV with Wynne DustEric Gill, NP on 03/05/2016 at 9:30 Am.

## 2016-03-05 ENCOUNTER — Other Ambulatory Visit: Payer: Self-pay

## 2016-03-05 ENCOUNTER — Encounter: Payer: Self-pay | Admitting: Nurse Practitioner

## 2016-03-05 ENCOUNTER — Ambulatory Visit (INDEPENDENT_AMBULATORY_CARE_PROVIDER_SITE_OTHER): Payer: Medicare Other | Admitting: Nurse Practitioner

## 2016-03-05 ENCOUNTER — Telehealth: Payer: Self-pay

## 2016-03-05 VITALS — BP 139/68 | HR 42 | Temp 97.8°F | Ht 69.0 in | Wt 253.8 lb

## 2016-03-05 DIAGNOSIS — Z1211 Encounter for screening for malignant neoplasm of colon: Secondary | ICD-10-CM

## 2016-03-05 DIAGNOSIS — Z789 Other specified health status: Secondary | ICD-10-CM | POA: Diagnosis not present

## 2016-03-05 DIAGNOSIS — Z7289 Other problems related to lifestyle: Secondary | ICD-10-CM

## 2016-03-05 MED ORDER — PEG 3350-KCL-NA BICARB-NACL 420 G PO SOLR: 4000.0000 mL | Freq: Once | ORAL | Status: DC

## 2016-03-05 NOTE — Progress Notes (Signed)
Primary Care Physician:  Pershing ProudJACKSON,SAMANTHA, PA-C Primary Gastroenterologist:  Dr. Jena Gaussourk  Chief Complaint  Patient presents with  . Colonoscopy    HPI:   Eric Dougherty is a 65 y.o. male who presents To schedule follow-up colonoscopy. Phone triage attted 02/11/2016 but was deferred to office visit due to medications. Last colonoscopy was 11/20/2005 which found minimal hemorrhoids, otherwise normal rectum, colon, terminal ileum. Recommend repeat exam in 10 years.  Today he states his last colonoscopy was a while ago, states they found nothing. Denies abdominal pain, N/V, hematochezia, melena, unintentional weight loss, fever, chills, acute changes in bowel habits. Typical bowel movements are "normal" but if he drinks coffe, has to go soon after that. Denies chest pain, dyspnea, dizziness, lightheadedness, syncope, near syncope. Denies any other upper or lower GI symptoms.  Last use of Oxycodone was a month ago for tooth extraction; priot to that for sciatica about a year ago. No chronic use.  Past Medical History  Diagnosis Date  . Diabetes mellitus     resolution of symptoms  . Hypertension     no longer on meds    Past Surgical History  Procedure Laterality Date  . Tonsillectomy    . Hernia repair    . Hemorrhoid surgery    . Lithotripsy      Current Outpatient Prescriptions  Medication Sig Dispense Refill  . aspirin EC 81 MG tablet Take 81 mg by mouth daily.    Marland Kitchen. latanoprost (XALATAN) 0.005 % ophthalmic solution Place 1 drop into both eyes at bedtime.    Marland Kitchen. lisinopril (PRINIVIL,ZESTRIL) 2.5 MG tablet Take 2.5 mg by mouth daily.    . metFORMIN (GLUCOPHAGE) 500 MG tablet Take by mouth 2 (two) times daily with a meal. Takes 2 tablets bid    . Misc Natural Products (OSTEO BI-FLEX JOINT SHIELD PO) Take 1 tablet by mouth daily.    . NON FORMULARY Probiotic  One tablet daily    . cyclobenzaprine (FLEXERIL) 10 MG tablet Take 1 tablet (10 mg total) by mouth 3 (three) times daily  as needed. (Patient not taking: Reported on 02/11/2016) 30 tablet 0  . diclofenac (VOLTAREN) 75 MG EC tablet Take 1 tablet (75 mg total) by mouth 2 (two) times daily. Take with food (Patient not taking: Reported on 02/11/2016) 14 tablet 0  . methylPREDNISolone (MEDROL DOSEPAK) 4 MG TBPK tablet Take as instructed on packet. (Patient not taking: Reported on 02/11/2016) 21 tablet 0  . naproxen sodium (ANAPROX) 220 MG tablet Take 440 mg by mouth daily as needed (pain). Reported on 03/05/2016    . oxyCODONE-acetaminophen (PERCOCET/ROXICET) 5-325 MG per tablet Take 1-2 tablets by mouth every 6 (six) hours as needed. (Patient not taking: Reported on 03/05/2016) 20 tablet 0   No current facility-administered medications for this visit.    Allergies as of 03/05/2016  . (No Known Allergies)    Family History  Problem Relation Age of Onset  . Cancer Father   . Hypothyroidism Brother   . Glaucoma Brother   . Osteoarthritis Other   . Cancer Other     Social History   Social History  . Marital Status: Married    Spouse Name: N/A  . Number of Children: N/A  . Years of Education: N/A   Occupational History  . Not on file.   Social History Main Topics  . Smoking status: Former Games developermoker  . Smokeless tobacco: Former NeurosurgeonUser     Comment: Quit x 17 years  . Alcohol  Use: 4.2 oz/week    7 Standard drinks or equivalent per week  . Drug Use: No  . Sexual Activity: Not on file   Other Topics Concern  . Not on file   Social History Narrative    Review of Systems: General: Negative for anorexia, weight loss, fever, chills, fatigue, weakness. ENT: Negative for hoarseness, difficulty swallowing. CV: Negative for chest pain, angina, palpitations, peripheral edema.  Respiratory: Negative for dyspnea at rest, cough, sputum, wheezing.  GI: See history of present illness. MS: Occasional joint pain.  Derm: Negative for rash or itching.  Endo: Negative for unusual weight change.  Heme: Negative for bruising  or bleeding. Allergy: Negative for rash or hives.    Physical Exam: BP 139/68 mmHg  Pulse 42  Temp(Src) 97.8 F (36.6 C) (Oral)  Ht 5' 9" (1.753 m)  Wt 253 lb 12.8 oz (115.123 kg)  BMI 37.46 kg/m2 General:   Alert and oriented. Pleasant and cooperative. Well-nourished and well-developed.  Head:  Normocephalic and atraumatic. Eyes:  Without icterus, sclera clear and conjunctiva pink.  Ears:  Normal auditory acuity. Cardiovascular:  S1, S2 present without murmurs appreciated. Extremities without clubbing or edema. Respiratory:  Clear to auscultation bilaterally. No wheezes, rales, or rhonchi. No distress.  Gastrointestinal:  +BS, rounded but soft, non-tender and non-distended. No HSM noted. No guarding or rebound. No masses appreciated.  Rectal:  Deferred  Musculoskalatal:  Symmetrical without gross deformities Skin:  Intact without significant lesions or rashes. Neurologic:  Alert and oriented x4;  grossly normal neurologically. Psych:  Alert and cooperative. Normal mood and affect. Heme/Lymph/Immune: No excessive bruising noted.    03/05/2016 9:50 AM   Disclaimer: This note was dictated with voice recognition software. Similar sounding words can inadvertently be transcribed and may not be corrected upon review.  

## 2016-03-05 NOTE — Telephone Encounter (Signed)
[facility] PA # is [id number]

## 2016-03-05 NOTE — Patient Instructions (Signed)
1. We will schedule your procedure for you. 2. Return for follow-up based on postprocedure recommendations. 

## 2016-03-05 NOTE — Assessment & Plan Note (Signed)
Last colonoscopy 10 years ago, currently due. Generally asymptomatic from a GI standpoint. We'll proceed with colonoscopy as recommended.  Proceed with TCS with Dr. Jena Gaussourk in near future: the risks, benefits, and alternatives have been discussed with the patient in detail. The patient states understanding and desires to proceed.  The patient is not on any anticoagulants, anxiolytics, chronic pain medications, or antidepressants. He does drink alcohol 2-3 times a week with about 2-3 drinks per sitting. We will provide 12.5 mg preprocedure Phenergan to promote adequate sedation.

## 2016-03-05 NOTE — Progress Notes (Signed)
cc'ed to pcp °

## 2016-03-25 ENCOUNTER — Encounter (HOSPITAL_COMMUNITY)
Admission: RE | Disposition: A | Discharge status: Home / Self Care | Payer: Self-pay | Source: Ambulatory Visit | Attending: Internal Medicine

## 2016-03-25 ENCOUNTER — Encounter (HOSPITAL_COMMUNITY): Payer: Self-pay | Admitting: *Deleted

## 2016-03-25 ENCOUNTER — Ambulatory Visit (HOSPITAL_COMMUNITY)
Admission: RE | Admit: 2016-03-25 | Discharge: 2016-03-25 | Disposition: A | Discharge status: Home / Self Care | Payer: Medicare Other | Source: Ambulatory Visit | Attending: Internal Medicine | Admitting: Internal Medicine

## 2016-03-25 DIAGNOSIS — Z7984 Long term (current) use of oral hypoglycemic drugs: Secondary | ICD-10-CM | POA: Diagnosis not present

## 2016-03-25 DIAGNOSIS — Z791 Long term (current) use of non-steroidal anti-inflammatories (NSAID): Secondary | ICD-10-CM | POA: Insufficient documentation

## 2016-03-25 DIAGNOSIS — Z87891 Personal history of nicotine dependence: Secondary | ICD-10-CM | POA: Insufficient documentation

## 2016-03-25 DIAGNOSIS — Z1211 Encounter for screening for malignant neoplasm of colon: Secondary | ICD-10-CM | POA: Insufficient documentation

## 2016-03-25 DIAGNOSIS — Q438 Other specified congenital malformations of intestine: Secondary | ICD-10-CM | POA: Diagnosis not present

## 2016-03-25 DIAGNOSIS — Z860100 Personal history of colon polyps, unspecified: Secondary | ICD-10-CM | POA: Insufficient documentation

## 2016-03-25 DIAGNOSIS — E119 Type 2 diabetes mellitus without complications: Secondary | ICD-10-CM | POA: Diagnosis not present

## 2016-03-25 DIAGNOSIS — Z7982 Long term (current) use of aspirin: Secondary | ICD-10-CM | POA: Diagnosis not present

## 2016-03-25 DIAGNOSIS — K5730 Diverticulosis of large intestine without perforation or abscess without bleeding: Secondary | ICD-10-CM | POA: Diagnosis not present

## 2016-03-25 DIAGNOSIS — K635 Polyp of colon: Secondary | ICD-10-CM | POA: Diagnosis not present

## 2016-03-25 HISTORY — DX: Nausea with vomiting, unspecified: Z98.890

## 2016-03-25 HISTORY — PX: COLONOSCOPY: SHX5424

## 2016-03-25 HISTORY — DX: Nausea with vomiting, unspecified: R11.2

## 2016-03-25 LAB — GLUCOSE, CAPILLARY: GLUCOSE-CAPILLARY: 137 mg/dL — AB (ref 65–99)

## 2016-03-25 SURGERY — COLONOSCOPY
Anesthesia: Moderate Sedation

## 2016-03-25 MED ORDER — STERILE WATER FOR IRRIGATION IR SOLN
Status: DC | PRN
  Administered 2016-03-25: 09:00:00

## 2016-03-25 MED ORDER — ONDANSETRON HCL 4 MG/2ML IJ SOLN
INTRAMUSCULAR | Status: DC | PRN
  Administered 2016-03-25: 4 mg via INTRAVENOUS

## 2016-03-25 MED ORDER — PROMETHAZINE HCL 25 MG/ML IJ SOLN
INTRAMUSCULAR | Status: AC
  Filled 2016-03-25: qty 1

## 2016-03-25 MED ORDER — MEPERIDINE HCL 100 MG/ML IJ SOLN
INTRAMUSCULAR | Status: AC
  Filled 2016-03-25: qty 1

## 2016-03-25 MED ORDER — PROMETHAZINE HCL 25 MG/ML IJ SOLN
12.5000 mg | Freq: Once | INTRAMUSCULAR | Status: AC
  Administered 2016-03-25: 12.5 mg via INTRAVENOUS

## 2016-03-25 MED ORDER — MIDAZOLAM HCL 5 MG/5ML IJ SOLN
INTRAMUSCULAR | Status: AC
  Filled 2016-03-25: qty 10

## 2016-03-25 MED ORDER — MEPERIDINE HCL 100 MG/ML IJ SOLN
INTRAMUSCULAR | Status: DC | PRN
  Administered 2016-03-25: 25 mg via INTRAVENOUS
  Administered 2016-03-25: 50 mg via INTRAVENOUS

## 2016-03-25 MED ORDER — SODIUM CHLORIDE 0.9% FLUSH
INTRAVENOUS | Status: AC
  Filled 2016-03-25: qty 10

## 2016-03-25 MED ORDER — ONDANSETRON HCL 4 MG/2ML IJ SOLN
INTRAMUSCULAR | Status: AC
  Filled 2016-03-25: qty 2

## 2016-03-25 MED ORDER — MIDAZOLAM HCL 5 MG/5ML IJ SOLN
INTRAMUSCULAR | Status: DC | PRN
  Administered 2016-03-25 (×3): 1 mg via INTRAVENOUS
  Administered 2016-03-25: 2 mg via INTRAVENOUS

## 2016-03-25 MED ORDER — MEPERIDINE HCL 100 MG/ML IJ SOLN
INTRAMUSCULAR | Status: AC
  Filled 2016-03-25: qty 2

## 2016-03-25 MED ORDER — SODIUM CHLORIDE 0.9 % IV SOLN
INTRAVENOUS | Status: DC
  Administered 2016-03-25: 1000 mL via INTRAVENOUS

## 2016-03-25 NOTE — H&P (View-Only) (Signed)
Primary Care Physician:  Pershing ProudJACKSON,SAMANTHA, PA-C Primary Gastroenterologist:  Dr. Jena Gaussourk  Chief Complaint  Patient presents with  . Colonoscopy    HPI:   Eric Dougherty is a 65 y.o. male who presents To schedule follow-up colonoscopy. Phone triage attted 02/11/2016 but was deferred to office visit due to medications. Last colonoscopy was 11/20/2005 which found minimal hemorrhoids, otherwise normal rectum, colon, terminal ileum. Recommend repeat exam in 10 years.  Today he states his last colonoscopy was a while ago, states they found nothing. Denies abdominal pain, N/V, hematochezia, melena, unintentional weight loss, fever, chills, acute changes in bowel habits. Typical bowel movements are "normal" but if he drinks coffe, has to go soon after that. Denies chest pain, dyspnea, dizziness, lightheadedness, syncope, near syncope. Denies any other upper or lower GI symptoms.  Last use of Oxycodone was a month ago for tooth extraction; priot to that for sciatica about a year ago. No chronic use.  Past Medical History  Diagnosis Date  . Diabetes mellitus     resolution of symptoms  . Hypertension     no longer on meds    Past Surgical History  Procedure Laterality Date  . Tonsillectomy    . Hernia repair    . Hemorrhoid surgery    . Lithotripsy      Current Outpatient Prescriptions  Medication Sig Dispense Refill  . aspirin EC 81 MG tablet Take 81 mg by mouth daily.    Marland Kitchen. latanoprost (XALATAN) 0.005 % ophthalmic solution Place 1 drop into both eyes at bedtime.    Marland Kitchen. lisinopril (PRINIVIL,ZESTRIL) 2.5 MG tablet Take 2.5 mg by mouth daily.    . metFORMIN (GLUCOPHAGE) 500 MG tablet Take by mouth 2 (two) times daily with a meal. Takes 2 tablets bid    . Misc Natural Products (OSTEO BI-FLEX JOINT SHIELD PO) Take 1 tablet by mouth daily.    . NON FORMULARY Probiotic  One tablet daily    . cyclobenzaprine (FLEXERIL) 10 MG tablet Take 1 tablet (10 mg total) by mouth 3 (three) times daily  as needed. (Patient not taking: Reported on 02/11/2016) 30 tablet 0  . diclofenac (VOLTAREN) 75 MG EC tablet Take 1 tablet (75 mg total) by mouth 2 (two) times daily. Take with food (Patient not taking: Reported on 02/11/2016) 14 tablet 0  . methylPREDNISolone (MEDROL DOSEPAK) 4 MG TBPK tablet Take as instructed on packet. (Patient not taking: Reported on 02/11/2016) 21 tablet 0  . naproxen sodium (ANAPROX) 220 MG tablet Take 440 mg by mouth daily as needed (pain). Reported on 03/05/2016    . oxyCODONE-acetaminophen (PERCOCET/ROXICET) 5-325 MG per tablet Take 1-2 tablets by mouth every 6 (six) hours as needed. (Patient not taking: Reported on 03/05/2016) 20 tablet 0   No current facility-administered medications for this visit.    Allergies as of 03/05/2016  . (No Known Allergies)    Family History  Problem Relation Age of Onset  . Cancer Father   . Hypothyroidism Brother   . Glaucoma Brother   . Osteoarthritis Other   . Cancer Other     Social History   Social History  . Marital Status: Married    Spouse Name: N/A  . Number of Children: N/A  . Years of Education: N/A   Occupational History  . Not on file.   Social History Main Topics  . Smoking status: Former Games developermoker  . Smokeless tobacco: Former NeurosurgeonUser     Comment: Quit x 17 years  . Alcohol  Use: 4.2 oz/week    7 Standard drinks or equivalent per week  . Drug Use: No  . Sexual Activity: Not on file   Other Topics Concern  . Not on file   Social History Narrative    Review of Systems: General: Negative for anorexia, weight loss, fever, chills, fatigue, weakness. ENT: Negative for hoarseness, difficulty swallowing. CV: Negative for chest pain, angina, palpitations, peripheral edema.  Respiratory: Negative for dyspnea at rest, cough, sputum, wheezing.  GI: See history of present illness. MS: Occasional joint pain.  Derm: Negative for rash or itching.  Endo: Negative for unusual weight change.  Heme: Negative for bruising  or bleeding. Allergy: Negative for rash or hives.    Physical Exam: BP 139/68 mmHg  Pulse 42  Temp(Src) 97.8 F (36.6 C) (Oral)  Ht  (1.753 m)  Wt 253 lb 12.8 oz (115.123 kg)  BMI 37.46 kg/m2 General:   Alert and oriented. Pleasant and cooperative. Well-nourished and well-developed.  Head:  Normocephalic and atraumatic. Eyes:  Without icterus, sclera clear and conjunctiva pink.  Ears:  Normal auditory acuity. Cardiovascular:  S1, S2 present without murmurs appreciated. Extremities without clubbing or edema. Respiratory:  Clear to auscultation bilaterally. No wheezes, rales, or rhonchi. No distress.  Gastrointestinal:  +BS, rounded but soft, non-tender and non-distended. No HSM noted. No guarding or rebound. No masses appreciated.  Rectal:  Deferred  Musculoskalatal:  Symmetrical without gross deformities Skin:  Intact without significant lesions or rashes. Neurologic:  Alert and oriented x4;  grossly normal neurologically. Psych:  Alert and cooperative. Normal mood and affect. Heme/Lymph/Immune: No excessive bruising noted.    03/05/2016 9:50 AM   Disclaimer: This note was dictated with voice recognition software. Similar sounding words can inadvertently be transcribed and may not be corrected upon review.

## 2016-03-25 NOTE — Interval H&P Note (Signed)
History and Physical Interval Note:  03/25/2016 9:22 AM  Eric Dougherty  has presented today for surgery, with the diagnosis of screening colonoscopy  The various methods of treatment have been discussed with the patient and family. After consideration of risks, benefits and other options for treatment, the patient has consented to  Procedure(s) with comments: COLONOSCOPY (N/A) - 0915 as a surgical intervention .  The patient's history has been reviewed, patient examined, no change in status, stable for surgery.  I have reviewed the patient's chart and labs.  Questions were answered to the patient's satisfaction.     Eric Dougherty   No change. Screening colonoscopy for plan. The risks, benefits, limitations, alternatives and imponderables have been reviewed with the patient. Questions have been answered. All parties are agreeable.

## 2016-03-25 NOTE — Discharge Instructions (Signed)
°Colonoscopy °Discharge Instructions ° °Read the instructions outlined below and refer to this sheet in the next few weeks. These discharge instructions provide you with general information on caring for yourself after you leave the hospital. Your doctor may also give you specific instructions. While your treatment has been planned according to the most current medical practices available, unavoidable complications occasionally occur. If you have any problems or questions after discharge, call Dr. Rourk at [phone]. °ACTIVITY °· You may resume your regular activity, but move at a slower pace for the next 24 hours.  °· Take frequent rest periods for the next 24 hours.  °· Walking will help get rid of the air and reduce the bloated feeling in your belly (abdomen).  °· No driving for 24 hours (because of the medicine (anesthesia) used during the test).   °· Do not sign any important legal documents or operate any machinery for 24 hours (because of the anesthesia used during the test).  °NUTRITION °· Drink plenty of fluids.  °· You may resume your normal diet as instructed by your doctor.  °· Begin with a light meal and progress to your normal diet. Heavy or fried foods are harder to digest and may make you feel sick to your stomach (nauseated).  °· Avoid alcoholic beverages for 24 hours or as instructed.  °MEDICATIONS °· You may resume your normal medications unless your doctor tells you otherwise.  °WHAT YOU CAN EXPECT TODAY °· Some feelings of bloating in the abdomen.  °· Passage of more gas than usual.  °· Spotting of blood in your stool or on the toilet paper.  °IF YOU HAD POLYPS REMOVED DURING THE COLONOSCOPY: °· No aspirin products for 7 days or as instructed.  °· No alcohol for 7 days or as instructed.  °· Eat a soft diet for the next 24 hours.  °FINDING OUT THE RESULTS OF YOUR TEST °Not all test results are available during your visit. If your test results are not back during the visit, make an appointment  with your caregiver to find out the results. Do not assume everything is normal if you have not heard from your caregiver or the medical facility. It is important for you to follow up on all of your test results.  °SEEK IMMEDIATE MEDICAL ATTENTION IF: °· You have more than a spotting of blood in your stool.  °· Your belly is swollen (abdominal distention).  °· You are nauseated or vomiting.  °· You have a temperature over 101.  °· You have abdominal pain or discomfort that is severe or gets worse throughout the day.  ° ° ° °Colon polyp and diverticulosis information provided ° °Further recommendations to follow pending review of pathology report ° ° ° ° ° °                                                                                                                     Colon Polyps °Polyps are lumps of extra tissue growing inside the   body. Polyps can grow in the large intestine (colon). Most colon polyps are noncancerous (benign). However, some colon polyps can become cancerous over time. Polyps that are larger than a pea may be harmful. To be safe, caregivers remove and test all polyps. °CAUSES  °Polyps form when mutations in the genes cause your cells to grow and divide even though no more tissue is needed. °RISK FACTORS °There are a number of risk factors that can increase your chances of getting colon polyps. They include: °· Being older than 50 years. °· Family history of colon polyps or colon cancer. °· Long-term colon diseases, such as colitis or Crohn disease. °· Being overweight. °· Smoking. °· Being inactive. °· Drinking too much alcohol. °SYMPTOMS  °Most small polyps do not cause symptoms. If symptoms are present, they may include: °· Blood in the stool. The stool may look dark red or black. °· Constipation or diarrhea that lasts longer than 1 week. °DIAGNOSIS °People often do not know they have polyps until their caregiver finds them during a regular checkup. Your caregiver can use 4 tests to check for  polyps: °· Digital rectal exam. The caregiver wears gloves and feels inside the rectum. This test would find polyps only in the rectum. °· Barium enema. The caregiver puts a liquid called barium into your rectum before taking X-rays of your colon. Barium makes your colon look white. Polyps are dark, so they are easy to see in the X-ray pictures. °· Sigmoidoscopy. A thin, flexible tube (sigmoidoscope) is placed into your rectum. The sigmoidoscope has a light and tiny camera in it. The caregiver uses the sigmoidoscope to look at the last third of your colon. °· Colonoscopy. This test is like sigmoidoscopy, but the caregiver looks at the entire colon. This is the most common method for finding and removing polyps. °TREATMENT  °Any polyps will be removed during a sigmoidoscopy or colonoscopy. The polyps are then tested for cancer. °PREVENTION  °To help lower your risk of getting more colon polyps: °· Eat plenty of fruits and vegetables. Avoid eating fatty foods. °· Do not smoke. °· Avoid drinking alcohol. °· Exercise every day. °· Lose weight if recommended by your caregiver. °· Eat plenty of calcium and folate. Foods that are rich in calcium include milk, cheese, and broccoli. Foods that are rich in folate include chickpeas, kidney beans, and spinach. °HOME CARE INSTRUCTIONS °Keep all follow-up appointments as directed by your caregiver. You may need periodic exams to check for polyps. °SEEK MEDICAL CARE IF: °You notice bleeding during a bowel movement. °  °This information is not intended to replace advice given to you by your health care provider. Make sure you discuss any questions you have with your health care provider. °  °Document Released: 07/15/2004 Document Revised: 11/09/2014 Document Reviewed: 12/29/2011 °Elsevier Interactive Patient Education ©2016 [employer] ° ° ° ° ° ° ° °Diverticulosis °Diverticulosis is the condition that develops when small pouches (diverticula) form in the wall of your colon. Your  colon, or large intestine, is where water is absorbed and stool is formed. The pouches form when the inside layer of your colon pushes through weak spots in the outer layers of your colon. °CAUSES  °No one knows exactly what causes diverticulosis. °RISK FACTORS °· Being older than 50. Your risk for this condition increases with age. Diverticulosis is rare in people younger than 40 years. By age 80, almost everyone has it. °· Eating a low-fiber diet. °· Being frequently constipated. °· Being overweight. °·   Not getting enough exercise. °· Smoking. °· Taking over-the-counter pain medicines, like aspirin and ibuprofen. °SYMPTOMS  °Most people with diverticulosis do not have symptoms. °DIAGNOSIS  °Because diverticulosis often has no symptoms, health care providers often discover the condition during an exam for other colon problems. In many cases, a health care provider will diagnose diverticulosis while using a flexible scope to examine the colon (colonoscopy). °TREATMENT  °If you have never developed an infection related to diverticulosis, you may not need treatment. If you have had an infection before, treatment may include: °· Eating more fruits, vegetables, and grains. °· Taking a fiber supplement. °· Taking a live bacteria supplement (probiotic). °· Taking medicine to relax your colon. °HOME CARE INSTRUCTIONS  °· Drink at least 6-8 glasses of water each day to prevent constipation. °· Try not to strain when you have a bowel movement. °· Keep all follow-up appointments. °If you have had an infection before:  °· Increase the fiber in your diet as directed by your health care provider or dietitian. °· Take a dietary fiber supplement if your health care provider approves. °· Only take medicines as directed by your health care provider. °SEEK MEDICAL CARE IF:  °· You have abdominal pain. °· You have bloating. °· You have cramps. °· You have not gone to the bathroom in 3 days. °SEEK IMMEDIATE MEDICAL CARE IF:  °· Your  pain gets worse. °· Your bloating becomes very bad. °· You have a fever or chills, and your symptoms suddenly get worse. °· You begin vomiting. °· You have bowel movements that are bloody or black. °MAKE SURE YOU: °· Understand these instructions. °· Will watch your condition. °· Will get help right away if you are not doing well or get worse. °  °This information is not intended to replace advice given to you by your health care provider. Make sure you discuss any questions you have with your health care provider. °  °Document Released: 07/16/2004 Document Revised: 10/24/2013 Document Reviewed: 09/13/2013 °Elsevier Interactive Patient Education ©2016 Elsevier Inc. ° ° °

## 2016-03-25 NOTE — Op Note (Signed)
[facility] Patient Name: Eric BraunCharles Ose Procedure Date: 03/25/2016 9:13 AM MRN: [id number] Date of Birth: 06/04/1951 Attending MD: Gennette Pacobert Michael  , MD CSN: [id number] Age: 65 Admit Type: Outpatient Procedure:                Colonoscopy was snare polypectomy (multiple) Indications:              average risk screening colonoscopy                           Screening for colorectal malignant neoplasm Providers:                Gennette Pacobert Michael , MD, Brain HiltsLurae Albert, RN, Calton Dachaylor                            Lemons, Technician Referring MD:             Avis EpleySamantha J. Jackson Medicines:                Midazolam 5 mg IV, Meperidine 75 mg IV,                            Promethazine 12.5 mg IV, Ondansetron 4 mg IV Complications:            No immediate complications. Estimated Blood Loss:     Estimated blood loss was minimal. Procedure:                Pre-Anesthesia Assessment:                           - Prior to the procedure, a History and Physical                            was performed, and patient medications and                            allergies were reviewed. The patient's tolerance of                            previous anesthesia was also reviewed. The risks                            and benefits of the procedure and the sedation                            options and risks were discussed with the patient.                            All questions were answered, and informed consent                            was obtained. Prior Anticoagulants: The patient has                            taken no previous anticoagulant or antiplatelet  agents. ASA Grade Assessment: III - A patient with                            severe systemic disease. After reviewing the risks                            and benefits, the patient was deemed in                            satisfactory condition to undergo the procedure.                           After obtaining informed  consent, the colonoscope                            was passed under direct vision. Throughout the                            procedure, the patient's blood pressure, pulse, and                            oxygen saturations were monitored continuously. The                            EC-[id number] ([id number]) scope was introduced through                            the anus and advanced to the the cecum, identified                            by appendiceal orifice and ileocecal valve. The                            quality of the bowel preparation was adequate. The                            ileocecal valve, appendiceal orifice, and rectum                            were photographed. Scope In: 9:29:03 AM Scope Out: 9:51:27 AM Scope Withdrawal Time: 0 hours 8 minutes 0 seconds  Total Procedure Duration: 0 hours 22 minutes 24 seconds  Findings:      The perianal and digital rectal examinations were normal.      Three semi-pedunculated polyps were found in the descending colon and       mid descending colon. The polyps were 4 to 6 mm in size. These polyps       were removed with a cold snare. Resection and retrieval were complete.       Estimated blood loss was minimal.      The colon (entire examined portion) was moderately redundant. Advancing       the scope required using manual pressure.      Multiple medium-mouthed diverticula were found in the sigmoid colon.      The exam was otherwise without abnormality  on direct and retroflexion       views.      Three semi-pedunculated polyps were found in the sigmoid colon. The       polyps were 3 to 6 mm in size. These polyps were removed with a cold       snare. Resection and retrieval were complete. Estimated blood loss was       minimal. Impression:               - Three 4 to 6 mm polyps in the descending colon                            and in the mid descending colon, removed with a                            cold snare. Resected and  retrieved.                           - Redundant colon.                           - Diverticulosis in the sigmoid colon.                           - The examination was otherwise normal on direct                            and retroflexion views. Moderate Sedation:      Moderate (conscious) sedation was administered by the endoscopy nurse       and supervised by the endoscopist. The following parameters were       monitored: oxygen saturation, heart rate, blood pressure, respiratory       rate, EKG, adequacy of pulmonary ventilation, and response to care.       Total physician intraservice time was 26 minutes. Recommendation:           - Patient has a contact number available for                            emergencies. The signs and symptoms of potential                            delayed complications were discussed with the                            patient. Return to normal activities tomorrow.                            Written discharge instructions were provided to the                            patient.                           - Advance diet as tolerated today.                           -  Continue present medications.                           - Await pathology results.                           - Repeat colonoscopy date to be determined after                            pending pathology results are reviewed for                            surveillance based on pathology results.                           - Return to GI office PRN. Procedure Code(s):        --- Professional ---                           [phone], Moderate sedation services provided by the                            same physician or other qualified health care                            professional performing the diagnostic or                            therapeutic service that the sedation supports,                            requiring the presence of an independent trained                            observer to  assist in the monitoring of the                            patient's level of consciousness and physiological                            status; initial 15 minutes of intraservice time,                            patient age 67 years or older                           [phone], Moderate sedation services; each additional                            15 minutes intraservice time Diagnosis Code(s):        --- Professional ---                           D12.4, Benign neoplasm of descending colon  K57.30, Diverticulosis of large intestine without                            perforation or abscess without bleeding                           Q43.8, Other specified congenital malformations of                            intestine CPT copyright 2016 American Medical Association. All rights reserved. The codes documented in this report are preliminary and upon coder review may  be revised to meet current compliance requirements. [name]. , MD Gennette Pac, MD 03/25/2016 10:15:09 AM This report has been signed electronically. Number of Addenda: 0

## 2016-03-26 ENCOUNTER — Encounter: Payer: Self-pay | Admitting: Internal Medicine

## 2016-03-31 ENCOUNTER — Encounter (HOSPITAL_COMMUNITY): Payer: Self-pay | Admitting: Internal Medicine

## 2017-06-18 ENCOUNTER — Other Ambulatory Visit (HOSPITAL_COMMUNITY): Payer: Self-pay | Admitting: Family Medicine

## 2017-06-18 DIAGNOSIS — Z87891 Personal history of nicotine dependence: Secondary | ICD-10-CM

## 2017-06-24 ENCOUNTER — Ambulatory Visit (HOSPITAL_COMMUNITY): Payer: Medicare Other

## 2017-06-24 ENCOUNTER — Encounter (HOSPITAL_COMMUNITY): Payer: Self-pay

## 2017-07-01 ENCOUNTER — Ambulatory Visit (HOSPITAL_COMMUNITY)
Admission: RE | Admit: 2017-07-01 | Discharge: 2017-07-01 | Disposition: A | Discharge status: Home / Self Care | Payer: Medicare Other | Source: Ambulatory Visit | Attending: Family Medicine | Admitting: Family Medicine

## 2017-07-01 DIAGNOSIS — K760 Fatty (change of) liver, not elsewhere classified: Secondary | ICD-10-CM | POA: Diagnosis not present

## 2017-07-01 DIAGNOSIS — M4810 Ankylosing hyperostosis [Forestier], site unspecified: Secondary | ICD-10-CM | POA: Diagnosis not present

## 2017-07-01 DIAGNOSIS — Z122 Encounter for screening for malignant neoplasm of respiratory organs: Secondary | ICD-10-CM | POA: Diagnosis present

## 2017-07-01 DIAGNOSIS — R918 Other nonspecific abnormal finding of lung field: Secondary | ICD-10-CM | POA: Diagnosis not present

## 2017-07-01 DIAGNOSIS — I700 Atherosclerosis of aorta: Secondary | ICD-10-CM | POA: Insufficient documentation

## 2017-07-01 DIAGNOSIS — Z87891 Personal history of nicotine dependence: Secondary | ICD-10-CM | POA: Diagnosis not present

## 2017-07-01 DIAGNOSIS — I2510 Atherosclerotic heart disease of native coronary artery without angina pectoris: Secondary | ICD-10-CM | POA: Insufficient documentation

## 2017-08-16 ENCOUNTER — Ambulatory Visit (INDEPENDENT_AMBULATORY_CARE_PROVIDER_SITE_OTHER): Payer: Medicare Other | Admitting: Cardiology

## 2017-08-16 ENCOUNTER — Encounter: Payer: Self-pay | Admitting: Cardiology

## 2017-08-16 VITALS — BP 126/66 | HR 70 | Ht 69.0 in | Wt 255.0 lb

## 2017-08-16 DIAGNOSIS — R079 Chest pain, unspecified: Secondary | ICD-10-CM

## 2017-08-16 DIAGNOSIS — I2510 Atherosclerotic heart disease of native coronary artery without angina pectoris: Secondary | ICD-10-CM

## 2017-08-16 DIAGNOSIS — I10 Essential (primary) hypertension: Secondary | ICD-10-CM | POA: Diagnosis not present

## 2017-08-16 DIAGNOSIS — E782 Mixed hyperlipidemia: Secondary | ICD-10-CM

## 2017-08-16 MED ORDER — ATORVASTATIN CALCIUM 40 MG PO TABS: 40.0000 mg | ORAL_TABLET | Freq: Every day | ORAL | 3 refills | Status: DC

## 2017-08-16 NOTE — Progress Notes (Signed)
Clinical Summary Eric Dougherty is a 66 y.o.male seen as new consult, referred by PA Jean Rosenthal for CAD.   1. CAD  - no known history of clinical event. 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 Mainly with rest. Lasts just a few seconds. 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  CAD risk factors: DM2, HTN, HL, former smoker,   2. HTN - compliant with meds  3. HL - 06/2017 TC 161 TG 272 HDL 39 LDL 68 - he has been on atorvastatin  daily   Past Medical History:  Diagnosis Date  . Diabetes mellitus    resolution of symptoms  . Hypertension    no longer on meds  . PONV (postoperative nausea and vomiting)   . Sciatica      No Known Allergies   Current Outpatient Prescriptions  Medication Sig Dispense Refill  . aspirin EC 81 MG tablet Take 81 mg by mouth daily.    Marland Kitchen latanoprost (XALATAN) 0.005 % ophthalmic solution Place 1 drop into both eyes at bedtime.    Marland Kitchen lisinopril (PRINIVIL,ZESTRIL) 2.5 MG tablet Take 2.5 mg by mouth daily.    . metFORMIN (GLUCOPHAGE-XR) 500 MG 24 hr tablet Take 2 tablets by mouth 2 (two) times daily.    . Misc Natural Products (OSTEO BI-FLEX JOINT SHIELD PO) Take 1 tablet by mouth daily.    . polyethylene glycol-electrolytes (NULYTELY/GOLYTELY) 420 g solution Take 4,000 mLs by mouth once. 4000 mL 0  . Probiotic Product (PROBIOTIC PO) Take 1 capsule by mouth daily.     No current facility-administered medications for this visit.      Past Surgical History:  Procedure Laterality Date  . COLONOSCOPY  11/20/2005   RMR: Minimal hemorrhoids, otherwise normal  . COLONOSCOPY N/A 03/25/2016   Procedure: COLONOSCOPY;  Surgeon: Corbin Ade, MD;  Location: [facility];  Service: Endoscopy;  Laterality: N/A;  0915  . HEMORRHOID SURGERY    . HERNIA REPAIR    . LITHOTRIPSY      . TONSILLECTOMY    . TOOTH EXTRACTION       No Known Allergies    Family History  Problem Relation Age of Onset  . Leukemia Father   . Hypothyroidism Brother   . Glaucoma Brother   . Osteoarthritis Other   . Prostate cancer Paternal Grandfather   . Colon cancer Neg Hx      Social History Eric Dougherty reports that he quit smoking about 18 years ago. He quit smokeless tobacco use about 38 years ago. His smokeless tobacco use included Chew. Eric Dougherty reports that he drinks about 4.2 oz of alcohol per week .   Review of Systems CONSTITUTIONAL: No weight loss, fever, chills, weakness or fatigue.  HEENT: Eyes: No visual loss, blurred vision, double vision or yellow sclerae.No hearing loss, sneezing, congestion, runny nose or sore throat.  SKIN: No rash or itching.  CARDIOVASCULAR: per hpi RESPIRATORY: No shortness of breath, cough or sputum.  GASTROINTESTINAL: No anorexia, nausea, vomiting or diarrhea. No abdominal pain or blood.  GENITOURINARY: No burning on urination, no polyuria NEUROLOGICAL: No headache, dizziness, syncope, paralysis, ataxia, numbness or tingling in the extremities. No change in bowel or bladder control.  MUSCULOSKELETAL: No muscle, back pain, joint pain or stiffness.  LYMPHATICS: No enlarged nodes.  No history of splenectomy.  PSYCHIATRIC: No history of depression or anxiety.  ENDOCRINOLOGIC: No reports of sweating, cold or heat intolerance. No polyuria or polydipsia.  Marland Kitchen   Physical Examination Vitals:   08/16/17 0914 08/16/17 0917  BP: 130/62 126/66  Pulse: 70   SpO2: 95%    Vitals:   08/16/17 0914  Weight: 255 lb (115.7 kg)  Height:  (1.753 m)    Gen: resting comfortably, no acute distress HEENT: no scleral icterus, pupils equal round and reactive, no palptable cervical adenopathy,  CV: RRR, no m/r/g, no jvd Resp: Clear to auscultation bilaterally GI: abdomen is soft, non-tender, non-distended, normal bowel sounds, no  hepatosplenomegaly MSK: extremities are warm, no edema.  Skin: warm, no rash Neuro:  no focal deficits Psych: appropriate affect    Assessment and Plan  1. CAD - incidental finding of CAD by recent CT chest, LM and 3 vessel disease. - he reports fairly nonspecific chest pain symptoms, some DOE with activities - he will require an ischemia functional assessment, we will plan for an exercise nuclear stress test - continue ASA, statin, ACE-I  2. HTN - at goal, continue current meds  3. Hyperlipidemia - in setting of DM2 with evidence of CAD would recommend at least moderate strength statin and potentially high dose over time. We will increase atorva to  daily today      Antoine Poche, M.D

## 2017-08-16 NOTE — Patient Instructions (Signed)
Medication Instructions:  INCREASE ATORVASTATIN TO 40 MG DAILY   Labwork: NONE  Testing/Procedures: Your physician has requested that you have en exercise stress myoview. For further information please visit [email or web address] Please follow instruction sheet, as given.     Follow-Up: Your physician recommends that you schedule a follow-up appointment in: PENDING TEST RESULTS, WE WILL CALL WITH RESULTS    Any Other Special Instructions Will Be Listed Below (If Applicable).     If you need a refill on your cardiac medications before your next appointment, please call your pharmacy.

## 2017-08-25 ENCOUNTER — Encounter (HOSPITAL_BASED_OUTPATIENT_CLINIC_OR_DEPARTMENT_OTHER)
Admission: RE | Admit: 2017-08-25 | Discharge: 2017-08-25 | Disposition: A | Discharge status: Home / Self Care | Payer: Medicare Other | Source: Ambulatory Visit | Attending: Cardiology | Admitting: Cardiology

## 2017-08-25 ENCOUNTER — Encounter (HOSPITAL_COMMUNITY)
Admission: RE | Admit: 2017-08-25 | Discharge: 2017-08-25 | Disposition: A | Discharge status: Home / Self Care | Payer: Medicare Other | Source: Ambulatory Visit | Attending: Cardiology | Admitting: Cardiology

## 2017-08-25 ENCOUNTER — Encounter (HOSPITAL_COMMUNITY): Payer: Self-pay

## 2017-08-25 DIAGNOSIS — R079 Chest pain, unspecified: Secondary | ICD-10-CM | POA: Diagnosis present

## 2017-08-25 LAB — NM MYOCAR MULTI W/SPECT W/WALL MOTION / EF
CHL CUP MPHR: 153 {beats}/min
CHL CUP NUCLEAR SDS: 0
CSEPHR: 90 %
Estimated workload: 8.2 METS
Exercise duration (min): 6 min
Exercise duration (sec): 55 s
LV sys vol: 46 mL
LVDIAVOL: 108 mL (ref 62–150)
Peak HR: 139 {beats}/min
RATE: 0.41
RPE: 17
Rest HR: 70 {beats}/min
SRS: 0
SSS: 0
TID: 0.88

## 2017-08-25 MED ORDER — REGADENOSON 0.4 MG/5ML IV SOLN
INTRAVENOUS | Status: AC
  Filled 2017-08-25: qty 5

## 2017-08-25 MED ORDER — TECHNETIUM TC 99M TETROFOSMIN IV KIT
30.0000 | PACK | Freq: Once | INTRAVENOUS | Status: AC | PRN
  Administered 2017-08-25: 33 via INTRAVENOUS

## 2017-08-25 MED ORDER — SODIUM CHLORIDE 0.9% FLUSH
INTRAVENOUS | Status: AC
  Administered 2017-08-25: 10 mL via INTRAVENOUS
  Filled 2017-08-25: qty 10

## 2017-08-25 MED ORDER — TECHNETIUM TC 99M TETROFOSMIN IV KIT
10.0000 | PACK | Freq: Once | INTRAVENOUS | Status: AC | PRN
  Administered 2017-08-25: 10.7 via INTRAVENOUS

## 2017-11-11 ENCOUNTER — Encounter: Payer: Self-pay | Admitting: Internal Medicine

## 2017-12-28 ENCOUNTER — Encounter: Payer: Self-pay | Admitting: Nurse Practitioner

## 2017-12-28 ENCOUNTER — Encounter: Payer: Self-pay | Admitting: *Deleted

## 2017-12-28 ENCOUNTER — Ambulatory Visit: Payer: Medicare Other | Admitting: Nurse Practitioner

## 2017-12-28 DIAGNOSIS — Z87898 Personal history of other specified conditions: Secondary | ICD-10-CM

## 2017-12-28 DIAGNOSIS — E8881 Metabolic syndrome: Secondary | ICD-10-CM | POA: Diagnosis not present

## 2017-12-28 DIAGNOSIS — R932 Abnormal findings on diagnostic imaging of liver and biliary tract: Secondary | ICD-10-CM | POA: Insufficient documentation

## 2017-12-28 DIAGNOSIS — F1011 Alcohol abuse, in remission: Secondary | ICD-10-CM | POA: Insufficient documentation

## 2017-12-28 NOTE — Progress Notes (Signed)
cc'ed to pcp °

## 2017-12-28 NOTE — Assessment & Plan Note (Signed)
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  He drank 2-3 drinks per sitting, at least 2-3 days a week.  Since being told he possibly has cirrhosis he has abstained from alcohol.  In general he is alcohol free for 2 months.  Recommend he continue to do this.  Follow-up in 6-8 weeks.

## 2017-12-28 NOTE — Progress Notes (Signed)
Referring Provider: [facility] Primary Care Physician:  Avis EpleyJackson, Samantha J, PA-C Primary GI:  Dr. Jena Gaussourk  Chief Complaint  Patient presents with  . Cirrhosis    HPI:   Eric Dougherty is a 67 y.o. male who presents for evaluation of newly diagnosed cirrhosis.  The patient was last seen in our office 03/05/2016 for encounter for screening colonoscopy and alcohol use.  Time he indicated he drank alcohol 2-3 times a week with about 2-3 drinks per sitting.  His colonoscopy was scheduled on propofol because of this.  Colonoscopy is up-to-date 03/25/2016 with findings of benign polyps and recommend repeat exam in 10 years (2027).  PCP notes reviewed including included office visit dated 06/14/2017. At that time he was seen for routine exam.  Labs ordered at that time which were drawn 06/14/2017 found low platelet count 134, normal hemoglobin.  AST/ALT normal at 23/32, bilirubin normal at 0.4, alkaline phosphatase normal at 55. CT exam for lung cancer screening/low-dose CT in the upper abdomen found a shrunken liver with nodular contour suggesting underlying cirrhosis.  He was subsequently referred to GI.  Abdominal ultrasound completed 02/03/2013 found diffuse echogenic consistency with fatty infiltration.  Today he states he's doing well overall. Within the last year hsi brother was diagnosed with rectal CA which likely reduces his maximum colonoscopy interval to 5 years (next due 2022 rather than 2027). Denies abdominal pain, N/V, hematochezia, melena, fever, chills, unintentional weight loss. Denies yellowing of skin/eyes, darkened urine, acute episodic confusion, generalized pruritis, tremors. Rare episode of loose stools which he feels is likely related to dairy. Used Lactaid and had no problems. Denies chest pain, dyspnea, dizziness, lightheadedness, syncope, near syncope. Denies any other upper or lower GI symptoms.  History of hypertension, obesity, DM at risk for metabolic  syndrome.  Denies history of IV drug use. When he got diagnosed with possible cirrhosis he stopped drinking all ETOH. Last drink around 11/02/2017.  Past Medical History:  Diagnosis Date  . Diabetes mellitus    resolution of symptoms  . Hypertension    no longer on meds  . PONV (postoperative nausea and vomiting)   . Sciatica     Past Surgical History:  Procedure Laterality Date  . COLONOSCOPY  11/20/2005   RMR: Minimal hemorrhoids, otherwise normal  . COLONOSCOPY N/A 03/25/2016   Procedure: COLONOSCOPY;  Surgeon: Corbin Adeobert M Rourk, MD;  Location: [facility];  Service: Endoscopy;  Laterality: N/A;  0915  . HEMORRHOID SURGERY    . HERNIA REPAIR    . LITHOTRIPSY    . TONSILLECTOMY    . TOOTH EXTRACTION      Current Outpatient Medications  Medication Sig Dispense Refill  . aspirin EC 81 MG tablet Take 81 mg by mouth daily.    Marland Kitchen. atorvastatin (LIPITOR) 40 MG tablet Take 1 tablet (40 mg total) by mouth daily. 90 tablet 3  . latanoprost (XALATAN) 0.005 % ophthalmic solution Place 1 drop into both eyes at bedtime.    Marland Kitchen. lisinopril (PRINIVIL,ZESTRIL) 2.5 MG tablet Take 2.5 mg by mouth daily.    . metFORMIN (GLUCOPHAGE-XR) 500 MG 24 hr tablet Take 2 tablets by mouth 2 (two) times daily.    . tadalafil (CIALIS) 5 MG tablet Take 5 mg by mouth daily.     . tamsulosin (FLOMAX) 0.4 MG CAPS capsule Take 0.4 mg by mouth daily.      No current facility-administered medications for this visit.     Allergies as of 12/28/2017  . (  No Known Allergies)    Family History  Problem Relation Age of Onset  . Leukemia Father   . Hypothyroidism Brother   . Glaucoma Brother   . Osteoarthritis Other   . Prostate cancer Paternal Grandfather   . Colon cancer Neg Hx     Social History   Socioeconomic History  . Marital status: Married    Spouse name: None  . Number of children: None  . Years of education: None  . Highest education level: None  Social Needs  . Financial resource strain: None  .  Food insecurity - worry: None  . Food insecurity - inability: None  . Transportation needs - medical: None  . Transportation needs - non-medical: None  Occupational History  . None  Tobacco Use  . Smoking status: Former Smoker    Last attempt to quit: 11/02/1998    Years since quitting: 19.1  . Smokeless tobacco: Former Neurosurgeon    Types: Chew    Quit date: 11/02/1978  Substance and Sexual Activity  . Alcohol use: Yes    Alcohol/week: 4.2 oz    Types: 7 Standard drinks or equivalent per week    Comment: 2-3 at a time; 2-3 times a week; none in 2019  . Drug use: No  . Sexual activity: None  Other Topics Concern  . None  Social History Narrative  . None    Review of Systems: General: Negative for anorexia, weight loss, fever, chills, fatigue, weakness. ENT: Negative for hoarseness, difficulty swallowing , nasal congestion. CV: Negative for chest pain, angina, palpitations, dyspnea on exertion, peripheral edema.  Respiratory: Negative for dyspnea at rest, dyspnea on exertion, cough, sputum, wheezing.  GI: See history of present illness. Derm: Negative for itching.  Neuro: Negative for memory loss, confusion.  Endo: Negative for unusual weight change.  Heme: Negative for bruising or bleeding. Allergy: Negative for rash or hives.   Physical Exam: BP (!) 147/70   Pulse 67   Temp (!) 97.3 F (36.3 C) (Oral)   Ht 5\' 9"  (1.753 m)   Wt 244 lb 9.6 oz (110.9 kg)   BMI 36.12 kg/m  General:   Obese male. Alert and oriented. Pleasant and cooperative. Well-nourished and well-developed.  Eyes:  Without icterus, sclera clear and conjunctiva pink.  Ears:  Normal auditory acuity. Mouth:  No deformity or lesions, oral mucosa pink.  Throat/Neck:  Supple, without mass or thyromegaly. Cardiovascular:  S1, S2 present without murmurs appreciated. Extremities without clubbing or edema. Respiratory:  Clear to auscultation bilaterally. No wheezes, rales, or rhonchi. No distress.  Gastrointestinal:   +BS, obese but soft, non-tender and non-distended. No HSM noted. No guarding or rebound. No masses appreciated.  Rectal:  Deferred  Musculoskalatal:  Symmetrical without gross deformities. Neurologic:  Alert and oriented x4;  grossly normal neurologically. Psych:  Alert and cooperative. Normal mood and affect. Heme/Lymph/Immune: No excessive bruising noted.    12/28/2017 10:44 AM   Disclaimer: This note was dictated with voice recognition software. Similar sounding words can inadvertently be transcribed and may not be corrected upon review.

## 2017-12-28 NOTE — Assessment & Plan Note (Signed)
The patient underwent routine chest CT due to smoking history which noted a nodular contour of his liver.  Previous ultrasound in 2014 of his abdomen found fatty liver disease.  At this point is a question of underlying cirrhosis.  We will proceed with extensive workup for multiple etiologies.  The patient does have metabolic syndrome as per above, history of regular alcohol use/borderline alcohol abuse.  Will check CBC, CMP, INR, AFP, ultrasound elastography for med of your score, hepatitis B and C serologies, autoimmune serologies, iron, ferritin, ceruloplasmin.  Recommend he avoid alcohol and hepatotoxic medications including Tylenol.  Return for follow-up in 6-8 weeks for further planning and diagnosis.

## 2017-12-28 NOTE — Patient Instructions (Signed)
1. Have your labs drawn when you are able to. 2. We will help schedule your ultrasound for you. 3. Return for follow-up in 6-8 weeks. 4. Continue to avoid alcohol and medicines that are toxic to your liver, especially Tylenol. 5. Call if you have any questions or concerns.

## 2017-12-28 NOTE — Assessment & Plan Note (Signed)
And is at risk/meets criteria for metabolic syndrome.  He is obese, diagnosed with hypertension and diabetes currently on antihypertensives and diabetic medications.  This could be contributing to fatty liver disease and Nash/cirrhosis.  Further liver workup as per below.  If his etiology is deemed Elita Booneash and metabolic syndrome we can reinforce diet and exercise guidelines.  Follow-up in 6-8 weeks.

## 2017-12-31 LAB — CBC WITH DIFFERENTIAL/PLATELET
BASOS PCT: 0.6 %
Basophils Absolute: 43 cells/uL (ref 0–200)
EOS ABS: 99 {cells}/uL (ref 15–500)
Eosinophils Relative: 1.4 %
HCT: 43.1 % (ref 38.5–50.0)
Hemoglobin: 14.8 g/dL (ref 13.2–17.1)
Lymphs Abs: 1519 cells/uL (ref 850–3900)
MCH: 31.4 pg (ref 27.0–33.0)
MCHC: 34.3 g/dL (ref 32.0–36.0)
MCV: 91.5 fL (ref 80.0–100.0)
MONOS PCT: 7.9 %
MPV: 12.7 fL — AB (ref 7.5–12.5)
NEUTROS PCT: 68.7 %
Neutro Abs: 4878 cells/uL (ref 1500–7800)
PLATELETS: 139 10*3/uL — AB (ref 140–400)
RBC: 4.71 10*6/uL (ref 4.20–5.80)
RDW: 12.4 % (ref 11.0–15.0)
Total Lymphocyte: 21.4 %
WBC mixed population: 561 cells/uL (ref 200–950)
WBC: 7.1 10*3/uL (ref 3.8–10.8)

## 2017-12-31 LAB — IRON,TIBC AND FERRITIN PANEL
%SAT: 36 % (calc) (ref 15–60)
Ferritin: 263 ng/mL (ref 20–380)
Iron: 117 ug/dL (ref 50–180)
TIBC: 325 mcg/dL (calc) (ref 250–425)

## 2017-12-31 LAB — HEPATITIS C RNA QUANTITATIVE
HCV Quantitative Log: <1.18 Log IU/mL
HCV RNA, PCR, QN: <15 IU/mL

## 2017-12-31 LAB — COMPREHENSIVE METABOLIC PANEL
AG Ratio: 1.6 (calc) (ref 1.0–2.5)
ALBUMIN MSPROF: 4.4 g/dL (ref 3.6–5.1)
ALKALINE PHOSPHATASE (APISO): 55 U/L (ref 40–115)
ALT: 33 U/L (ref 9–46)
AST: 27 U/L (ref 10–35)
BUN: 11 mg/dL (ref 7–25)
CHLORIDE: 103 mmol/L (ref 98–110)
CO2: 26 mmol/L (ref 20–32)
CREATININE: 0.75 mg/dL (ref 0.70–1.25)
Calcium: 9.8 mg/dL (ref 8.6–10.3)
GLOBULIN: 2.7 g/dL (ref 1.9–3.7)
Glucose, Bld: 116 mg/dL (ref 65–139)
POTASSIUM: 4.5 mmol/L (ref 3.5–5.3)
SODIUM: 140 mmol/L (ref 135–146)
TOTAL PROTEIN: 7.1 g/dL (ref 6.1–8.1)
Total Bilirubin: 0.6 mg/dL (ref 0.2–1.2)

## 2017-12-31 LAB — HEPATITIS C GENOTYPE: HCV GENOTYPE: NOT DETECTED

## 2017-12-31 LAB — HEPATITIS C ANTIBODY
HEP C AB: NONREACTIVE
SIGNAL TO CUT-OFF: 0.02 (ref <1.00)

## 2017-12-31 LAB — MITOCHONDRIAL ANTIBODIES: Mitochondrial M2 Ab, IgG: <=20 U

## 2017-12-31 LAB — PROTIME-INR
INR: 1
Prothrombin Time: 10.7 s (ref 9.0–11.5)

## 2017-12-31 LAB — HEPATITIS B CORE ANTIBODY, TOTAL: Hep B Core Total Ab: NONREACTIVE

## 2017-12-31 LAB — ANTI-SMOOTH MUSCLE ANTIBODY, IGG: Actin (Smooth Muscle) Antibody (IGG): <20 U (ref <20)

## 2017-12-31 LAB — AFP TUMOR MARKER: AFP-Tumor Marker: 2.1 ng/mL (ref <6.1)

## 2017-12-31 LAB — HEPATITIS B SURFACE ANTIGEN: Hepatitis B Surface Ag: NONREACTIVE

## 2017-12-31 LAB — ANA: Anti Nuclear Antibody(ANA): NEGATIVE

## 2017-12-31 LAB — HEPATITIS B SURFACE ANTIBODY,QUALITATIVE: Hep B S Ab: NONREACTIVE

## 2017-12-31 LAB — CERULOPLASMIN: Ceruloplasmin: 24 mg/dL (ref 18–36)

## 2018-01-03 ENCOUNTER — Ambulatory Visit (HOSPITAL_COMMUNITY)
Admission: RE | Admit: 2018-01-03 | Discharge: 2018-01-03 | Disposition: A | Discharge status: Home / Self Care | Payer: Medicare Other | Source: Ambulatory Visit | Attending: Nurse Practitioner | Admitting: Nurse Practitioner

## 2018-01-03 DIAGNOSIS — R932 Abnormal findings on diagnostic imaging of liver and biliary tract: Secondary | ICD-10-CM | POA: Diagnosis present

## 2018-01-03 DIAGNOSIS — F1011 Alcohol abuse, in remission: Secondary | ICD-10-CM | POA: Diagnosis not present

## 2018-01-03 DIAGNOSIS — E8881 Metabolic syndrome: Secondary | ICD-10-CM

## 2018-03-02 ENCOUNTER — Encounter: Payer: Self-pay | Admitting: Nurse Practitioner

## 2018-03-02 ENCOUNTER — Ambulatory Visit: Payer: Medicare Other | Admitting: Nurse Practitioner

## 2018-03-02 DIAGNOSIS — D696 Thrombocytopenia, unspecified: Secondary | ICD-10-CM | POA: Diagnosis not present

## 2018-03-02 DIAGNOSIS — K7460 Unspecified cirrhosis of liver: Secondary | ICD-10-CM | POA: Insufficient documentation

## 2018-03-02 NOTE — Assessment & Plan Note (Addendum)
Cirrhosis.  Ultrasound elastography found F3.  However, nodular contour and mild thrombocytopenia indicates likely early cirrhosis.  At this point he is continuing to abstain from alcohol and he is congratulated on this.  Recommend he continue alcohol abstinence.  Recommend he continue his diet and exercise plan which he is done very well at.  Overall, he states he has lost about 30 pounds.  He does have mild thrombocytopenia with a platelet count 139.  No full abdominal ultrasound since 2014.  I will check an abdominal ultrasound for splenomegaly.  This will help in our decision-making for whether he needs an EGD for variceal screening.  I will also give him a prescription today for the hepatitis B vaccine series.  Return for follow-up in 6 months.

## 2018-03-02 NOTE — Assessment & Plan Note (Signed)
Mild thrombus cytopenia with platelet count of 139.  Unknown status of possible splenomegaly.  We will check an abdominal ultrasound for spinal megaly to decide if he has possible portal hypertension in order to help [place] make a decision about possible EGD for variceal screening.  Return for follow-up in 6 months.

## 2018-03-02 NOTE — Patient Instructions (Signed)
1. Continue to abstain from alcohol, you are doing a great job! 2. Congratulations on your significant weight loss!!  This will help improve your health overall, including liver disease. 3. We will help schedule your ultrasound of your abdomen for you. 4. I am writing a prescription for the hepatitis B vaccine series, as we discussed. 5. Follow-up in 6 months. 6. Call us if you have any questions or concerns  At [facility] we value your feedback. You may receive a survey about your visit today. Please share your experience as we strive to create trusting relationships with our patients to provide genuine, compassionate, quality care.  It was great to see you today!  Glad you are doing well, have a wonderful summer!!

## 2018-03-02 NOTE — Progress Notes (Signed)
Referring Provider: Avis Epley, PA* Primary Care Physician:  Avis Epley, PA-C Primary GI:  Dr. Jena Gauss  Chief Complaint  Patient presents with  . Cirrhosis    f/u. Doing well    HPI:   Eric Dougherty is a 67 y.o. 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  The patient was last seen in our office 12/28/2017 for abnormal CT the liver, history of alcohol abuse, metabolic syndrome.  At that time it was noted he was newly diagnosed with cirrhosis.  Colonoscopy up-to-date 2017 and recommended repeat exam in 10 years.  ED exam for lung cancer screening/low-dose CT found liver with nodular contour suggesting underlying cirrhosis.  Abdominal ultrasound in 2014 found fatty liver.  At his last visit he was doing well overall.  His brother was diagnosed with rectal cancer within the last year and this would likely reduce his colonoscopy interval to 5 years (next due 2022 rather than 2027).  Rare loose stools which is likely related to dairy, per the patient.  Uses Lactaid and has no problems when he does.  No other GI or hepatic symptoms.  Denies history of IV drug use.  Stop drinking all alcohol when he was diagnosed with cirrhosis.  Last drink approximately 11/02/2017.  Commended labs including CBC, CMP, INR, AFP, ultrasound elastography, hepatitis B and C serologies, autoimmune serologies, iron, ferritin, ceruloplasmin.  Avoid alcohol and hepatotoxic medications.  Follow-up in 6 to 8 weeks.  Labs found low platelet count, normal kidney liver function, normal electrolytes, normal INR, normal iron and copper levels.  Hepatitis B and C tests are negative.  However, not immune to hepatitis B.  Autoimmune labs normal.  Likely because of apparent liver cirrhosis is combination of fatty liver disease and previous alcohol consumption.  Recommended hepatitis B vaccine series.  Keep follow-up appointment.  Ultrasound elastography found med of your fibrosis score of F2/F3.  Noted coarse  hepatic echotexture with mild nodular hepatic contour raising possibility of cirrhosis.  No focal hepatic lesions noted.  Has never had an EGD.  Today he states he's doing well overall. Has lost 30 lbs intentionally through diet and exercise. Continues to abstain from alcohol. He has not had Hep B vaccine series, states he will do that ASAP. Denies abdominal pain, N/V, hematochezia, melena, fever, chills, unintentional weight loss. Denies yellowing of skin/eyes, darkened urine, generalized pruritis, acute generalized confusion, tremors. Denies chest pain, dyspnea, dizziness, lightheadedness, syncope, near syncope. Denies any other upper or lower GI symptoms.  Past Medical History:  Diagnosis Date  . Diabetes mellitus    resolution of symptoms  . Hypertension    no longer on meds  . PONV (postoperative nausea and vomiting)   . Sciatica     Past Surgical History:  Procedure Laterality Date  . COLONOSCOPY  11/20/2005   RMR: Minimal hemorrhoids, otherwise normal  . COLONOSCOPY N/A 03/25/2016   Procedure: COLONOSCOPY;  Surgeon: Corbin Ade, MD;  Location: [facility];  Service: Endoscopy;  Laterality: N/A;  0915  . HEMORRHOID SURGERY    . HERNIA REPAIR    . LITHOTRIPSY    . TONSILLECTOMY    . TOOTH EXTRACTION      Current Outpatient Medications  Medication Sig Dispense Refill  . aspirin EC 81 MG tablet Take 81 mg by mouth daily.    Marland Kitchen atorvastatin (LIPITOR) 40 MG tablet Take 1 tablet (40 mg total) by mouth daily. 90 tablet 3  . latanoprost (XALATAN) 0.005 % ophthalmic  solution Place 1 drop into both eyes at bedtime.    Marland Kitchen lisinopril (PRINIVIL,ZESTRIL) 2.5 MG tablet Take 2.5 mg by mouth daily.    . metFORMIN (GLUCOPHAGE-XR) 500 MG 24 hr tablet Take 1 tablet by mouth daily with breakfast.     . tadalafil (CIALIS) 5 MG tablet Take 5 mg by mouth daily.     . tamsulosin (FLOMAX) 0.4 MG CAPS capsule Take 0.4 mg by mouth daily.      No current facility-administered medications for this  visit.     Allergies as of 03/02/2018  . (No Known Allergies)    Family History  Problem Relation Age of Onset  . Leukemia Father   . Hypothyroidism Brother   . Glaucoma Brother   . Rectal cancer Brother   . Osteoarthritis Other   . Prostate cancer Paternal Grandfather   . Cirrhosis Cousin   . Colon cancer Neg Hx     Social History   Socioeconomic History  . Marital status: Married    Spouse name: Not on file  . Number of children: Not on file  . Years of education: Not on file  . Highest education level: Not on file  Occupational History  . Not on file  Social Needs  . Financial resource strain: Not on file  . Food insecurity:    Worry: Not on file    Inability: Not on file  . Transportation needs:    Medical: Not on file    Non-medical: Not on file  Tobacco Use  . Smoking status: Former Smoker    Last attempt to quit: 11/02/1998    Years since quitting: 19.3  . Smokeless tobacco: Former Neurosurgeon    Types: Chew    Quit date: 11/02/1978  Substance and Sexual Activity  . Alcohol use: Not Currently    Alcohol/week: 4.2 oz    Types: 7 Standard drinks or equivalent per week    Comment: 2-3 at a time; 2-3 times a week; none in 2019  . Drug use: No  . Sexual activity: Not on file  Lifestyle  . Physical activity:    Days per week: Not on file    Minutes per session: Not on file  . Stress: Not on file  Relationships  . Social connections:    Talks on phone: Not on file    Gets together: Not on file    Attends religious service: Not on file    Active member of club or organization: Not on file    Attends meetings of clubs or organizations: Not on file    Relationship status: Not on file  Other Topics Concern  . Not on file  Social History Narrative  . Not on file    Review of Systems: General: Negative for anorexia, weight loss, fever, chills, fatigue, weakness. ENT: Negative for hoarseness, difficulty swallowing. CV: Negative for chest pain, angina,  palpitations, peripheral edema.  Respiratory: Negative for dyspnea at rest, cough, sputum, wheezing.  GI: See history of present illness. Derm: Negative for rash or itching.  Neuro: Negative for memory loss, confusion.  Endo: Negative for unusual weight change.  Heme: Negative for bruising or bleeding. Allergy: Negative for rash or hives.   Physical Exam: BP 130/63   Pulse 61   Temp (!) 97.1 F (36.2 C) (Oral)   Ht  (1.753 m)   Wt 227 lb 3.2 oz (103.1 kg)   BMI 33.55 kg/m  General:   Alert and oriented. Pleasant and cooperative. Well-nourished  and well-developed.  Eyes:  Without icterus, sclera clear and conjunctiva pink.  Ears:  Normal auditory acuity. Cardiovascular:  S1, S2 present without murmurs appreciated. Extremities without clubbing or edema. Respiratory:  Clear to auscultation bilaterally. No wheezes, rales, or rhonchi. No distress.  Gastrointestinal:  +BS, soft, non-tender and non-distended. No HSM noted. No guarding or rebound. No masses appreciated.  Rectal:  Deferred  Musculoskalatal:  Symmetrical without gross deformities. Skin:  Intact without significant lesions or rashes. Neurologic:  Alert and oriented x4;  grossly normal neurologically. Psych:  Alert and cooperative. Normal mood and affect. Heme/Lymph/Immune: No excessive bruising noted.    03/02/2018 2:48 PM   Disclaimer: This note was dictated with voice recognition software. Similar sounding words can inadvertently be transcribed and may not be corrected upon review.

## 2018-03-03 NOTE — Progress Notes (Signed)
cc'ed to pcp °

## 2018-03-08 ENCOUNTER — Ambulatory Visit (HOSPITAL_COMMUNITY)
Admission: RE | Admit: 2018-03-08 | Discharge: 2018-03-08 | Disposition: A | Discharge status: Home / Self Care | Payer: Medicare Other | Source: Ambulatory Visit | Attending: Nurse Practitioner | Admitting: Nurse Practitioner

## 2018-03-08 DIAGNOSIS — D696 Thrombocytopenia, unspecified: Secondary | ICD-10-CM | POA: Insufficient documentation

## 2018-03-08 DIAGNOSIS — N200 Calculus of kidney: Secondary | ICD-10-CM | POA: Diagnosis not present

## 2018-03-08 DIAGNOSIS — K7460 Unspecified cirrhosis of liver: Secondary | ICD-10-CM | POA: Diagnosis present

## 2018-09-04 NOTE — Progress Notes (Signed)
Referring Provider: Avis Epley, PA* Primary Care Physician:  Avis Epley, PA-C Primary GI:  Dr. Jena Gauss  Chief Complaint  Patient presents with  . Cirrhosis    HPI:   Eric Dougherty is a 67 y.o. male who presents for follow-up on cirrhosis.  The patient was last seen in our office 03/02/2018 for cirrhosis and thrombocytopenia.  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  Noted history of alcohol abuse and metabolic syndrome.  He was recently diagnosed with cirrhosis.  Colonoscopy up-to-date 2017 and next due in 2027.  Abdominal ultrasound in 2014 with fatty liver, CT of the lungs for cancer screening found nodular contour suggesting underlying cirrhosis.  Denies history of IV drug use.  He stopped drinking all alcohol when he was diagnosed with cirrhosis with last drink approximately 11/02/2017.  Cirrhosis work-up labs completed which found low platelet count, normal kidney liver function, normal electrolytes, normal INR, normal iron and copper levels.  Hepatitis B and C tests are negative.  However, not immune to hepatitis B.  Autoimmune labs normal.  Likely because of apparent liver cirrhosis is combination of fatty liver disease and previous alcohol consumption.  Recommended hepatitis B vaccine series.  Keep follow-up appointment.  Ultrasound elastography found med of your fibrosis score of F2/F3.  Noted coarse hepatic echotexture with mild nodular hepatic contour raising possibility of cirrhosis.  No focal hepatic lesions noted.  He has never had an EGD.  At his last visit he was doing well, had lost 30 pounds intentionally through diet and exercise.  Continues to abstain from alcohol.  Has not had hepatitis B vaccine series but states he will have this completed as soon as possible.  No other GI or hepatic symptoms.  Recommended continue abstinence from alcohol, continue great work with weight loss, follow-up routine cirrhosis ultrasound (labs recently completed), follow-up in 6  months.  Ultrasound elastography completed 03/08/2018 which found consistent with cirrhosis but no focal liver lesion.  Incidentally noted ectatic abdominal aorta at risk for aneurysm development and recommended follow-up ultrasound in 5 years.  Today he states he's doing well overall. Denies abdominal pain, N/V, hematochezia, melena, fever, chills, unintentional weight loss. He has gained some of his weight back. Denies yellowing of skin/eyes, darkened urine, acute episodic confusion, tremors/shakes, generalized pruritis. Denies chest pain, dyspnea, dizziness, lightheadedness, syncope, near syncope. Denies any other upper or lower GI symptoms.  He has continued to do well with ETOH abstinence. He previously had not had any ETOH in 2019. He did have 1 drink at his family beach trip, but none otherwise.  Past Medical History:  Diagnosis Date  . Diabetes mellitus    resolution of symptoms  . Hypertension    no longer on meds  . PONV (postoperative nausea and vomiting)   . Sciatica     Past Surgical History:  Procedure Laterality Date  . COLONOSCOPY  11/20/2005   RMR: Minimal hemorrhoids, otherwise normal  . COLONOSCOPY N/A 03/25/2016   Procedure: COLONOSCOPY;  Surgeon: Corbin Ade, MD;  Location: [facility];  Service: Endoscopy;  Laterality: N/A;  0915  . HEMORRHOID SURGERY    . HERNIA REPAIR    . LITHOTRIPSY    . TONSILLECTOMY    . TOOTH EXTRACTION      Current Outpatient Medications  Medication Sig Dispense Refill  . aspirin EC 81 MG tablet Take 81 mg by mouth daily.    Marland Kitchen atorvastatin (LIPITOR) 40 MG tablet Take 1 tablet (40 mg total) by  mouth daily. 90 tablet 3  . latanoprost (XALATAN) 0.005 % ophthalmic solution Place 1 drop into both eyes at bedtime.    Marland Kitchen lisinopril (PRINIVIL,ZESTRIL) 2.5 MG tablet Take 2.5 mg by mouth daily.    . metFORMIN (GLUCOPHAGE-XR) 500 MG 24 hr tablet Take 1 tablet by mouth daily with breakfast.     . tadalafil (CIALIS) 5 MG tablet Take 5 mg by  mouth daily.     . tamsulosin (FLOMAX) 0.4 MG CAPS capsule Take 0.4 mg by mouth daily.      No current facility-administered medications for this visit.     Allergies as of 09/05/2018  . (No Known Allergies)    Family History  Problem Relation Age of Onset  . Leukemia Father   . Hypothyroidism Brother   . Glaucoma Brother   . Rectal cancer Brother   . Osteoarthritis Other   . Prostate cancer Paternal Grandfather   . Cirrhosis Cousin   . Colon cancer Neg Hx     Social History   Socioeconomic History  . Marital status: Married    Spouse name: Not on file  . Number of children: Not on file  . Years of education: Not on file  . Highest education level: Not on file  Occupational History  . Not on file  Social Needs  . Financial resource strain: Not on file  . Food insecurity:    Worry: Not on file    Inability: Not on file  . Transportation needs:    Medical: Not on file    Non-medical: Not on file  Tobacco Use  . Smoking status: Former Smoker    Last attempt to quit: 11/02/1998    Years since quitting: 19.8  . Smokeless tobacco: Former Neurosurgeon    Types: Chew    Quit date: 11/02/1978  Substance and Sexual Activity  . Alcohol use: Not Currently    Alcohol/week: 7.0 standard drinks    Types: 7 Standard drinks or equivalent per week    Comment: 2-3 at a time; 2-3 times a week; previously: none in 2019 but did have 1 drink in June on vacation.  . Drug use: No  . Sexual activity: Not on file  Lifestyle  . Physical activity:    Days per week: Not on file    Minutes per session: Not on file  . Stress: Not on file  Relationships  . Social connections:    Talks on phone: Not on file    Gets together: Not on file    Attends religious service: Not on file    Active member of club or organization: Not on file    Attends meetings of clubs or organizations: Not on file    Relationship status: Not on file  Other Topics Concern  . Not on file  Social History Narrative  . Not  on file    Review of Systems: General: Negative for anorexia, weight loss, fever, chills, fatigue, weakness. ENT: Negative for hoarseness, difficulty swallowing. CV: Negative for chest pain, angina, palpitations, peripheral edema.  Respiratory: Negative for dyspnea at rest, cough, sputum, wheezing.  GI: See history of present illness. Derm: Negative for rash or itching.  Neuro: Negative for memory loss, confusion.  Endo: Negative for unusual weight change.  Heme: Negative for bruising or bleeding. Allergy: Negative for rash or hives.   Physical Exam: BP 137/63   Pulse 66   Temp (!) 97.3 F (36.3 C) (Oral)   Ht 5\' 8"  (1.727 m)  Wt 246 lb 12.8 oz (111.9 kg)   BMI 37.53 kg/m  General:   Alert and oriented. Pleasant and cooperative. Well-nourished and well-developed.  Eyes:  Without icterus, sclera clear and conjunctiva pink.  Ears:  Normal auditory acuity. Cardiovascular:  S1, S2 present without murmurs appreciated. Extremities without clubbing or edema. Respiratory:  Clear to auscultation bilaterally. No wheezes, rales, or rhonchi. No distress.  Gastrointestinal:  +BS, soft, non-tender and non-distended. No HSM noted. No guarding or rebound. No masses appreciated.  Rectal:  Deferred  Musculoskalatal:  Symmetrical without gross deformities. Neurologic:  Alert and oriented x4;  grossly normal neurologically. Psych:  Alert and cooperative. Normal mood and affect. Heme/Lymph/Immune: No excessive bruising noted.    09/05/2018 11:20 AM   Disclaimer: This note was dictated with voice recognition software. Similar sounding words can inadvertently be transcribed and may not be corrected upon review.

## 2018-09-05 ENCOUNTER — Ambulatory Visit: Payer: Medicare Other | Admitting: Nurse Practitioner

## 2018-09-05 ENCOUNTER — Other Ambulatory Visit: Payer: Self-pay

## 2018-09-05 ENCOUNTER — Encounter: Payer: Self-pay | Admitting: Nurse Practitioner

## 2018-09-05 VITALS — BP 137/63 | HR 66 | Temp 97.3°F | Ht 68.0 in | Wt 246.8 lb

## 2018-09-05 DIAGNOSIS — D696 Thrombocytopenia, unspecified: Secondary | ICD-10-CM

## 2018-09-05 DIAGNOSIS — K7460 Unspecified cirrhosis of liver: Secondary | ICD-10-CM

## 2018-09-05 DIAGNOSIS — K766 Portal hypertension: Secondary | ICD-10-CM

## 2018-09-05 NOTE — Assessment & Plan Note (Signed)
The patient has recently diagnosed cirrhosis likely due to history of significant alcohol intake.  He also has obesity and possible interplay with nonalcoholic fatty liver disease.  His last labs show well compensated disease.  At this point we will update his labs and imaging.  We will do an upper endoscopy due to possible portal hypertension given thrombocytopenia, although his spleen size is normal on ultrasound.  We will have him follow-up in 6 months for routine labs and imaging.  Today he is generally asymptomatic from a GI and hepatic standpoint.  He has done well to avoid alcohol.

## 2018-09-05 NOTE — Assessment & Plan Note (Signed)
Mild thrombocytopenia possibly related to portal hypertension in the setting of cirrhosis.  We will do an EGD as per above.  His platelet count is not significantly depressed.  He denies overt bleeding or bruising.  We will monitor this with his routine cirrhosis labs.  Follow-up in 6 months.

## 2018-09-05 NOTE — Assessment & Plan Note (Addendum)
The patient likely has portal hypertension as a sequela of cirrhosis given his thrombocytopenia.  At this point he has never had an EGD.  We will proceed with upper endoscopy to evaluate for esophageal varices and portal gastropathy.  Further management based on results.  Follow-up in 6 months.  Proceed with EGD with Dr. Jena Gauss in near future: the risks, benefits, and alternatives have been discussed with the patient in detail. The patient states understanding and desires to proceed.  The patient is not on any anticoagulants, anxiolytics, chronic pain medications, or antidepressants.  Conscious sedation should be adequate for his procedure.

## 2018-09-05 NOTE — Patient Instructions (Signed)
1. Have your labs completed when you are able to. 2. We will help you schedule your ultrasound. 3. We will schedule your upper endoscopy for you. 4. Return for follow-up in 6 months. 5. Call us if you have any questions or concerns.  At [facility] we value your feedback. You may receive a survey about your visit today. Please share your experience as we strive to create trusting relationships with our patients to provide genuine, compassionate, quality care.  We appreciate your understanding and patience as we review any laboratory studies, imaging, and other diagnostic tests that are ordered as we care for you. Our office policy is 5 business days for review of these results, and any emergent or urgent results are addressed in a timely manner for your best interest. If you do not hear from our office in 1 week, please contact us.   We also encourage the use of MyChart, which contains your medical information for your review as well. If you are not enrolled in this feature, an access code is on this after visit summary for your convenience. Thank you for allowing [place] to be involved in your care.  It was great to see you today!  I hope you have a great Fall!!

## 2018-09-05 NOTE — Progress Notes (Signed)
CC'D TO PCP °

## 2018-09-05 NOTE — Addendum Note (Signed)
Addended by: Delane Ginger, ERIC A on: 09/05/2018 11:22 AM   Modules accepted: Orders

## 2018-09-09 ENCOUNTER — Ambulatory Visit (HOSPITAL_COMMUNITY)
Admission: RE | Admit: 2018-09-09 | Discharge: 2018-09-09 | Disposition: A | Discharge status: Home / Self Care | Payer: Medicare Other | Source: Ambulatory Visit | Attending: Nurse Practitioner | Admitting: Nurse Practitioner

## 2018-09-09 DIAGNOSIS — K766 Portal hypertension: Secondary | ICD-10-CM | POA: Insufficient documentation

## 2018-09-09 DIAGNOSIS — K7460 Unspecified cirrhosis of liver: Secondary | ICD-10-CM | POA: Diagnosis present

## 2018-09-09 DIAGNOSIS — D696 Thrombocytopenia, unspecified: Secondary | ICD-10-CM | POA: Insufficient documentation

## 2018-09-16 ENCOUNTER — Other Ambulatory Visit: Payer: Self-pay | Admitting: Cardiology

## 2018-10-11 LAB — CBC WITH DIFFERENTIAL/PLATELET
BASOS PCT: 0.3 %
Basophils Absolute: 19 cells/uL (ref 0–200)
Eosinophils Absolute: 170 cells/uL (ref 15–500)
Eosinophils Relative: 2.7 %
HEMATOCRIT: 42.8 % (ref 38.5–50.0)
HEMOGLOBIN: 14.6 g/dL (ref 13.2–17.1)
LYMPHS ABS: 1247 {cells}/uL (ref 850–3900)
MCH: 31.1 pg (ref 27.0–33.0)
MCHC: 34.1 g/dL (ref 32.0–36.0)
MCV: 91.3 fL (ref 80.0–100.0)
MPV: 12 fL (ref 7.5–12.5)
Monocytes Relative: 7.3 %
NEUTROS ABS: 4404 {cells}/uL (ref 1500–7800)
Neutrophils Relative %: 69.9 %
PLATELETS: 146 10*3/uL (ref 140–400)
RBC: 4.69 10*6/uL (ref 4.20–5.80)
RDW: 12.7 % (ref 11.0–15.0)
TOTAL LYMPHOCYTE: 19.8 %
WBC: 6.3 10*3/uL (ref 3.8–10.8)
WBCMIX: 460 {cells}/uL (ref 200–950)

## 2018-10-11 LAB — PROTIME-INR
INR: 1
Prothrombin Time: 10.6 s (ref 9.0–11.5)

## 2018-10-11 LAB — AFP TUMOR MARKER: AFP TUMOR MARKER: 2.1 ng/mL (ref <6.1)

## 2018-10-11 LAB — COMPREHENSIVE METABOLIC PANEL
AG RATIO: 1.6 (calc) (ref 1.0–2.5)
ALBUMIN MSPROF: 4.2 g/dL (ref 3.6–5.1)
ALKALINE PHOSPHATASE (APISO): 56 U/L (ref 40–115)
ALT: 19 U/L (ref 9–46)
AST: 18 U/L (ref 10–35)
BILIRUBIN TOTAL: 0.5 mg/dL (ref 0.2–1.2)
BUN: 16 mg/dL (ref 7–25)
CALCIUM: 9.2 mg/dL (ref 8.6–10.3)
CHLORIDE: 102 mmol/L (ref 98–110)
CO2: 29 mmol/L (ref 20–32)
CREATININE: 0.78 mg/dL (ref 0.70–1.25)
GLOBULIN: 2.6 g/dL (ref 1.9–3.7)
Glucose, Bld: 161 mg/dL — ABNORMAL HIGH (ref 65–99)
POTASSIUM: 4.3 mmol/L (ref 3.5–5.3)
Sodium: 137 mmol/L (ref 135–146)
Total Protein: 6.8 g/dL (ref 6.1–8.1)

## 2018-11-09 ENCOUNTER — Encounter (HOSPITAL_COMMUNITY)
Admission: RE | Disposition: A | Discharge status: Home / Self Care | Payer: Self-pay | Source: Home / Self Care | Attending: Internal Medicine

## 2018-11-09 ENCOUNTER — Other Ambulatory Visit: Payer: Self-pay

## 2018-11-09 ENCOUNTER — Encounter (HOSPITAL_COMMUNITY): Payer: Self-pay | Admitting: *Deleted

## 2018-11-09 ENCOUNTER — Ambulatory Visit (HOSPITAL_COMMUNITY)
Admission: RE | Admit: 2018-11-09 | Discharge: 2018-11-09 | Disposition: A | Discharge status: Home / Self Care | Payer: Medicare Other | Attending: Internal Medicine | Admitting: Internal Medicine

## 2018-11-09 DIAGNOSIS — K7460 Unspecified cirrhosis of liver: Secondary | ICD-10-CM | POA: Insufficient documentation

## 2018-11-09 DIAGNOSIS — K2950 Unspecified chronic gastritis without bleeding: Secondary | ICD-10-CM | POA: Insufficient documentation

## 2018-11-09 DIAGNOSIS — K210 Gastro-esophageal reflux disease with esophagitis: Secondary | ICD-10-CM | POA: Diagnosis not present

## 2018-11-09 DIAGNOSIS — Z79899 Other long term (current) drug therapy: Secondary | ICD-10-CM | POA: Insufficient documentation

## 2018-11-09 DIAGNOSIS — Z87891 Personal history of nicotine dependence: Secondary | ICD-10-CM | POA: Insufficient documentation

## 2018-11-09 DIAGNOSIS — Z7982 Long term (current) use of aspirin: Secondary | ICD-10-CM | POA: Insufficient documentation

## 2018-11-09 DIAGNOSIS — I10 Essential (primary) hypertension: Secondary | ICD-10-CM | POA: Diagnosis not present

## 2018-11-09 DIAGNOSIS — K766 Portal hypertension: Secondary | ICD-10-CM | POA: Diagnosis not present

## 2018-11-09 DIAGNOSIS — K209 Esophagitis, unspecified: Secondary | ICD-10-CM

## 2018-11-09 DIAGNOSIS — E119 Type 2 diabetes mellitus without complications: Secondary | ICD-10-CM | POA: Diagnosis not present

## 2018-11-09 DIAGNOSIS — Z13810 Encounter for screening for upper gastrointestinal disorder: Secondary | ICD-10-CM

## 2018-11-09 DIAGNOSIS — K3189 Other diseases of stomach and duodenum: Secondary | ICD-10-CM | POA: Insufficient documentation

## 2018-11-09 DIAGNOSIS — Z7984 Long term (current) use of oral hypoglycemic drugs: Secondary | ICD-10-CM | POA: Diagnosis not present

## 2018-11-09 DIAGNOSIS — D696 Thrombocytopenia, unspecified: Secondary | ICD-10-CM | POA: Diagnosis not present

## 2018-11-09 HISTORY — PX: BIOPSY: SHX5522

## 2018-11-09 HISTORY — PX: ESOPHAGOGASTRODUODENOSCOPY: SHX5428

## 2018-11-09 LAB — GLUCOSE, CAPILLARY: Glucose-Capillary: 183 mg/dL — ABNORMAL HIGH (ref 70–99)

## 2018-11-09 SURGERY — EGD (ESOPHAGOGASTRODUODENOSCOPY)
Anesthesia: Moderate Sedation

## 2018-11-09 MED ORDER — LIDOCAINE VISCOUS HCL 2 % MT SOLN
OROMUCOSAL | Status: DC | PRN
  Administered 2018-11-09: 1 via OROMUCOSAL

## 2018-11-09 MED ORDER — ONDANSETRON HCL 4 MG/2ML IJ SOLN
INTRAMUSCULAR | Status: DC | PRN
  Administered 2018-11-09: 4 mg via INTRAVENOUS

## 2018-11-09 MED ORDER — MIDAZOLAM HCL 5 MG/5ML IJ SOLN
INTRAMUSCULAR | Status: DC | PRN
  Administered 2018-11-09 (×2): 1 mg via INTRAVENOUS
  Administered 2018-11-09: 2 mg via INTRAVENOUS
  Administered 2018-11-09: 1 mg via INTRAVENOUS

## 2018-11-09 MED ORDER — STERILE WATER FOR IRRIGATION IR SOLN
Status: DC | PRN
  Administered 2018-11-09: 1.5 mL

## 2018-11-09 MED ORDER — LIDOCAINE VISCOUS HCL 2 % MT SOLN
OROMUCOSAL | Status: AC
  Filled 2018-11-09: qty 15

## 2018-11-09 MED ORDER — MEPERIDINE HCL 50 MG/ML IJ SOLN
INTRAMUSCULAR | Status: AC
  Filled 2018-11-09: qty 1

## 2018-11-09 MED ORDER — ONDANSETRON HCL 4 MG/2ML IJ SOLN
INTRAMUSCULAR | Status: AC
  Filled 2018-11-09: qty 2

## 2018-11-09 MED ORDER — MIDAZOLAM HCL 5 MG/5ML IJ SOLN
INTRAMUSCULAR | Status: AC
  Filled 2018-11-09: qty 10

## 2018-11-09 MED ORDER — MEPERIDINE HCL 100 MG/ML IJ SOLN
INTRAMUSCULAR | Status: DC | PRN
  Administered 2018-11-09 (×2): 25 mg via INTRAVENOUS

## 2018-11-09 MED ORDER — SODIUM CHLORIDE 0.9 % IV SOLN
INTRAVENOUS | Status: DC
  Administered 2018-11-09: 07:00:00 via INTRAVENOUS

## 2018-11-09 NOTE — H&P (Signed)
 @LOGO @   Primary Care Physician:  Avis EpleyJackson, Samantha J, PA-C Primary Gastroenterologist:  Dr. Jena Gaussourk  Pre-Procedure History & Physical: HPI:  Eric Dougherty is a 68 y.o. male here for screening EGD.  Recently diagnosed cirrhosis likely secondary to [place] and ASH.  No upper GI tract symptoms.  No dysphagia no GERD.  Past Medical History:  Diagnosis Date  . Diabetes mellitus    resolution of symptoms  . Hypertension    no longer on meds  . PONV (postoperative nausea and vomiting)   . Sciatica     Past Surgical History:  Procedure Laterality Date  . COLONOSCOPY  11/20/2005   RMR: Minimal hemorrhoids, otherwise normal  . COLONOSCOPY N/A 03/25/2016   Procedure: COLONOSCOPY;  Surgeon: Corbin Adeobert M , MD;  Location: [facility];  Service: Endoscopy;  Laterality: N/A;  0915  . HEMORRHOID SURGERY    . HERNIA REPAIR    . LITHOTRIPSY    . TONSILLECTOMY    . TOOTH EXTRACTION      Prior to Admission medications   Medication Sig Start Date End Date Taking? Authorizing Provider  aspirin EC 81 MG tablet Take 81 mg by mouth daily.   Yes [provider]  atorvastatin (LIPITOR) 40 MG tablet TAKE (1) TABLET BY MOUTH ONCE DAILY. Patient taking differently: Take 40 mg by mouth daily.  09/16/18  Yes Branch, Dorothe PeaJonathan F, MD  latanoprost (XALATAN) 0.005 % ophthalmic solution Place 1 drop into both eyes at bedtime. 12/05/14  Yes [provider]  lisinopril (PRINIVIL,ZESTRIL) 2.5 MG tablet Take 2.5 mg by mouth daily.   Yes [provider]  metFORMIN (GLUCOPHAGE-XR) 500 MG 24 hr tablet Take 500 mg by mouth daily with breakfast.  02/10/16  Yes [provider]  naproxen sodium (ALEVE) 220 MG tablet Take 440 mg by mouth daily as needed (pain).   Yes [provider]  tadalafil (CIALIS) 5 MG tablet Take 5 mg by mouth daily.  05/19/16  Yes [provider]  tamsulosin (FLOMAX) 0.4 MG CAPS capsule Take 0.4 mg by mouth daily.  04/16/13  Yes [provider]     Allergies as of 09/05/2018  . (No Known Allergies)    Family History  Problem Relation Age of Onset  . Leukemia Father   . Hypothyroidism Brother   . Glaucoma Brother   . Rectal cancer Brother   . Osteoarthritis Other   . Prostate cancer Paternal Grandfather   . Cirrhosis Cousin   . Colon cancer Neg Hx     Social History   Socioeconomic History  . Marital status: Married    Spouse name: Not on file  . Number of children: Not on file  . Years of education: Not on file  . Highest education level: Not on file  Occupational History  . Not on file  Social Needs  . Financial resource strain: Not on file  . Food insecurity:    Worry: Not on file    Inability: Not on file  . Transportation needs:    Medical: Not on file    Non-medical: Not on file  Tobacco Use  . Smoking status: Former Smoker    Last attempt to quit: 11/02/1998    Years since quitting: 20.0  . Smokeless tobacco: Former NeurosurgeonUser    Types: Chew    Quit date: 11/02/1978  Substance and Sexual Activity  . Alcohol use: Not Currently    Alcohol/week: 7.0 standard drinks    Types: 7 Standard drinks or equivalent  per week    Comment: 2-3 at a time; 2-3 times a week; previously: none in 2019 but did have 1 drink in June on vacation.  . Drug use: No  . Sexual activity: Not on file  Lifestyle  . Physical activity:    Days per week: Not on file    Minutes per session: Not on file  . Stress: Not on file  Relationships  . Social connections:    Talks on phone: Not on file    Gets together: Not on file    Attends religious service: Not on file    Active member of club or organization: Not on file    Attends meetings of clubs or organizations: Not on file    Relationship status: Not on file  . Intimate partner violence:    Fear of current or ex partner: Not on file    Emotionally abused: Not on file    Physically abused: Not on file    Forced sexual activity: Not on file  Other Topics Concern  . Not on file   Social History Narrative  . Not on file    Review of Systems: See HPI, otherwise negative ROS  Physical Exam: BP (!) 154/80   Pulse 71   Temp (!) 97.4 F (36.3 C) (Oral)   Resp 18   Ht 5\' 8"  (1.727 m)   Wt 104.3 kg   SpO2 95%   BMI 34.97 kg/m  General:   Alert,  Well-developed, well-nourished, pleasant and cooperative in NAD Neck:  Supple; no masses or thyromegaly. No significant cervical adenopathy. Lungs:  Clear throughout to auscultation.   No wheezes, crackles, or rhonchi. No acute distress. Heart:  Regular rate and rhythm; no murmurs, clicks, rubs,  or gallops. Abdomen: Non-distended, normal bowel sounds.  Soft and nontender without appreciable mass or hepatosplenomegaly.  Pulses:  Normal pulses noted. Extremities:  Without clubbing or edema.  Impression/Plan: 68 year old gentleman recently diagnosed cirrhosis.  Here for screening EGD to assess for varices.  The risks, benefits, limitations, alternatives and imponderables have been reviewed with the patient. Potential for esophageal dilation, biopsy, etc. have also been reviewed.  Questions have been answered. All parties agreeable.     Notice: This dictation was prepared with Dragon dictation along with smaller phrase technology. Any transcriptional errors that result from this process are unintentional and may not be corrected upon review.

## 2018-11-09 NOTE — Op Note (Signed)
[facility] Patient Name: Eric Dougherty Procedure Date: 11/09/2018 7:32 AM MRN: [id number] Date of Birth: 08/03/1951 Attending MD: Gennette Pacobert Michael  , MD CSN: [id number] Age: 68 Admit Type: Outpatient Procedure:                Upper GI endoscopy Indications:              Screening procedure Providers:                Gennette Pacobert Michael , MD, Nena PolioLisa Moore, RN, Edythe ClarityKelly                            Cox, Technician Referring MD:              Medicines:                Midazolam 4 mg IV, Meperidine 50 mg IV Complications:            No immediate complications. Estimated Blood Loss:     Estimated blood loss was minimal. Procedure:                Pre-Anesthesia Assessment:                           - Prior to the procedure, a History and Physical                            was performed, and patient medications and                            allergies were reviewed. The patient's tolerance of                            previous anesthesia was also reviewed. The risks                            and benefits of the procedure and the sedation                            options and risks were discussed with the patient.                            All questions were answered, and informed consent                            was obtained. Prior Anticoagulants: The patient has                            taken no previous anticoagulant or antiplatelet                            agents. ASA Grade Assessment: II - A patient with                            mild systemic disease. After reviewing the risks  and benefits, the patient was deemed in                            satisfactory condition to undergo the procedure.                           After obtaining informed consent, the endoscope was                            passed under direct vision. Throughout the                            procedure, the patient's blood pressure, pulse, and                            oxygen  saturations were monitored continuously. The                            [id number]-[id number] ([id number]) scope was introduced through the                            mouth, and advanced to the second part of duodenum.                            The upper GI endoscopy was accomplished without                            difficulty. The patient tolerated the procedure                            well. Scope In: 7:46:04 AM Scope Out: 7:51:58 AM Total Procedure Duration: 0 hours 5 minutes 54 seconds  Findings:      Esophagitis with no bleeding was found. No Barrett's epithelium seen no       varices.      Diffuse moderately erythematous mucosa was found in the entire examined       stomach. Antral erosions. No portal gastropathy.      The duodenal bulb and second portion of the duodenum were normal.       Estimated blood loss was minimal. Impression:               - Erosive reflux esophagitis                           - Erythematous mucosa in the stomach. Biopsied                           - Normal duodenal bulb and second portion of the                            duodenum.                           - No specimens collected. Moderate Sedation:      Moderate (conscious) sedation was administered by the endoscopy nurse  and supervised by the endoscopist. The following parameters were       monitored: oxygen saturation, heart rate, blood pressure, respiratory       rate, EKG, adequacy of pulmonary ventilation, and response to care.       Total physician intraservice time was 14 minutes. Recommendation:           - Patient has a contact number available for                            emergencies. The signs and symptoms of potential                            delayed complications were discussed with the                            patient. Return to normal activities tomorrow.                            Written discharge instructions were provided to the                            patient.                            - Advance diet as tolerated.                           - Continue present medications. Begin Protonix 40                            mg daily. Continue weight loss exercise and                            abstinence from alcohol. Regular coffee consumption                            may help the liver as well. Follow-up on pathology                           - Repeat upper endoscopy in 2 years for                            surveillance.                           - Return to GI clinic in 3 months. Procedure Code(s):        --- Professional ---                           [phone], Esophagogastroduodenoscopy, flexible,                            transoral; diagnostic, including collection of                            specimen(s) by brushing  or washing, when performed                            (separate procedure)                           G0500, Moderate sedation services provided by the                            same physician or other qualified health care                            professional performing a gastrointestinal                            endoscopic service that sedation supports,                            requiring the presence of an independent trained                            observer to assist in the monitoring of the                            patient's level of consciousness and physiological                            status; initial 15 minutes of intra-service time;                            patient age 69 years or older (additional time may                            be reported with [id number], as appropriate) Diagnosis Code(s):        --- Professional ---                           K20.9, Esophagitis, unspecified                           K31.89, Other diseases of stomach and duodenum                           Z13.810, Encounter for screening for upper                            gastrointestinal disorder CPT copyright 2018 [employer]. All  rights reserved. The codes documented in this report are preliminary and upon coder review may  be revised to meet current compliance requirements. Gerrit Friendsobert M. , MD Gennette Pacobert Michael , MD 11/09/2018 8:08:37 AM This report has been signed electronically. Number of Addenda: 0

## 2018-11-09 NOTE — Discharge Instructions (Signed)
EGD Discharge instructions Please read the instructions outlined below and refer to this sheet in the next few weeks. These discharge instructions provide you with general information on caring for yourself after you leave the hospital. Your doctor may also give you specific instructions. While your treatment has been planned according to the most current medical practices available, unavoidable complications occasionally occur. If you have any problems or questions after discharge, please call your doctor.  Dr Jena Gaussourk:  [phone]:  [phone].  After hours and weekends call the hospital and have the GI doctor on call paged.  They will call you back. ACTIVITY  You may resume your regular activity but move at a slower pace for the next 24 hours.   Take frequent rest periods for the next 24 hours.   Walking will help expel (get rid of) the air and reduce the bloated feeling in your abdomen.   No driving for 24 hours (because of the anesthesia (medicine) used during the test).   You may shower.   Do not sign any important legal documents or operate any machinery for 24 hours (because of the anesthesia used during the test).  NUTRITION  Drink plenty of fluids.   You may resume your normal diet.   Begin with a light meal and progress to your normal diet.   Avoid alcoholic beverages for 24 hours or as instructed by your caregiver.  MEDICATIONS  You may resume your normal medications unless your caregiver tells you otherwise.  WHAT YOU CAN EXPECT TODAY  You may experience abdominal discomfort such as a feeling of fullness or gas pains.  FOLLOW-UP  Your doctor will discuss the results of your test with you.  SEEK IMMEDIATE MEDICAL ATTENTION IF ANY OF THE FOLLOWING OCCUR:  Excessive nausea (feeling sick to your stomach) and/or vomiting.   Severe abdominal pain and distention (swelling).   Trouble swallowing.   Temperature over 101 F (37.8 C).   Rectal bleeding or vomiting of blood.     GERD information provided  Continue alcohol abstinence  Continue weight loss and regular exercise  Drinking coffee regularly may actually help your liver as well  Begin Protonix 40 mg daily.  Take 30 minutes before first meal of the day  Further recommendations to follow pending review of pathology report  Office visit with us in 3 months   Gastroesophageal Reflux Disease, Adult Gastroesophageal reflux (GER) happens when acid from the stomach flows up into the tube that connects the mouth and the stomach (esophagus). Normally, food travels down the esophagus and stays in the stomach to be digested. However, when a person has GER, food and stomach acid sometimes move back up into the esophagus. If this becomes a more serious problem, the person may be diagnosed with a disease called gastroesophageal reflux disease (GERD). GERD occurs when the reflux:  Happens often.  Causes frequent or severe symptoms.  Causes problems such as damage to the esophagus. When stomach acid comes in contact with the esophagus, the acid may cause soreness (inflammation) in the esophagus. Over time, GERD may create small holes (ulcers) in the lining of the esophagus. What are the causes? This condition is caused by a problem with the muscle between the esophagus and the stomach (lower esophageal sphincter, or LES). Normally, the LES muscle closes after food passes through the esophagus to the stomach. When the LES is weakened or abnormal, it does not close properly, and that allows food and stomach acid to go back up into the esophagus. The  LES can be weakened by certain dietary substances, medicines, and medical conditions, including:  Tobacco use.  Pregnancy.  Having a hiatal hernia.  Alcohol use.  Certain foods and beverages, such as coffee, chocolate, onions, and peppermint. What increases the risk? You are more likely to develop this condition if you:  Have an increased body weight.  Have  a connective tissue disorder.  Use NSAID medicines. What are the signs or symptoms? Symptoms of this condition include:  Heartburn.  Difficult or painful swallowing.  The feeling of having a lump in the throat.  Abitter taste in the mouth.  Bad breath.  Having a large amount of saliva.  Having an upset or bloated stomach.  Belching.  Chest pain. Different conditions can cause chest pain. Make sure you see your health care provider if you experience chest pain.  Shortness of breath or wheezing.  Ongoing (chronic) cough or a night-time cough.  Wearing away of tooth enamel.  Weight loss. How is this diagnosed? Your health care provider will take a medical history and perform a physical exam. To determine if you have mild or severe GERD, your health care provider may also monitor how you respond to treatment. You may also have tests, including:  A test to examine your stomach and esophagus with a small camera (endoscopy).  A test thatmeasures the acidity level in your esophagus.  A test thatmeasures how much pressure is on your esophagus.  A barium swallow or modified barium swallow test to show the shape, size, and functioning of your esophagus. How is this treated? The goal of treatment is to help relieve your symptoms and to prevent complications. Treatment for this condition may vary depending on how severe your symptoms are. Your health care provider may recommend:  Changes to your diet.  Medicine.  Surgery. Follow these instructions at home: Eating and drinking   Follow a diet as recommended by your health care provider. This may involve avoiding foods and drinks such as: ? Coffee and tea (with or without caffeine). ? Drinks that containalcohol. ? Energy drinks and sports drinks. ? Carbonated drinks or sodas. ? Chocolate and cocoa. ? Peppermint and mint flavorings. ? Garlic and onions. ? Horseradish. ? Spicy and acidic foods, including peppers,  chili powder, curry powder, vinegar, hot sauces, and barbecue sauce. ? Citrus fruit juices and citrus fruits, such as oranges, lemons, and limes. ? Tomato-based foods, such as red sauce, chili, salsa, and pizza with red sauce. ? Fried and fatty foods, such as donuts, french fries, potato chips, and high-fat dressings. ? High-fat meats, such as hot dogs and fatty cuts of red and white meats, such as rib eye steak, sausage, ham, and bacon. ? High-fat dairy items, such as whole milk, butter, and cream cheese.  Eat small, frequent meals instead of large meals.  Avoid drinking large amounts of liquid with your meals.  Avoid eating meals during the 2-3 hours before bedtime.  Avoid lying down right after you eat.  Do not exercise right after you eat. Lifestyle   Do not use any products that contain nicotine or tobacco, such as cigarettes, e-cigarettes, and chewing tobacco. If you need help quitting, ask your health care provider.  Try to reduce your stress by using methods such as yoga or meditation. If you need help reducing stress, ask your health care provider.  If you are overweight, reduce your weight to an amount that is healthy for you. Ask your health care provider for guidance about a  safe weight loss goal. General instructions  Pay attention to any changes in your symptoms.  Take over-the-counter and prescription medicines only as told by your health care provider. Do not take aspirin, ibuprofen, or other NSAIDs unless your health care provider told you to do so.  Wear loose-fitting clothing. Do not wear anything tight around your waist that causes pressure on your abdomen.  Raise (elevate) the head of your bed about 6 inches (15 cm).  Avoid bending over if this makes your symptoms worse.  Keep all follow-up visits as told by your health care provider. This is important. Contact a health care provider if:  You have: ? New symptoms. ? Unexplained weight loss. ? Difficulty  swallowing or it hurts to swallow. ? Wheezing or a persistent cough. ? A hoarse voice.  Your symptoms do not improve with treatment. Get help right away if you:  Have pain in your arms, neck, jaw, teeth, or back.  Feel sweaty, dizzy, or light-headed.  Have chest pain or shortness of breath.  Vomit and your vomit looks like blood or coffee grounds.  Faint.  Have stool that is bloody or black.  Cannot swallow, drink, or eat. Summary  Gastroesophageal reflux happens when acid from the stomach flows up into the esophagus. GERD is a disease in which the reflux happens often, causes frequent or severe symptoms, or causes problems such as damage to the esophagus.  Treatment for this condition may vary depending on how severe your symptoms are. Your health care provider may recommend diet and lifestyle changes, medicine, or surgery.  Contact a health care provider if you have new or worsening symptoms.  Take over-the-counter and prescription medicines only as told by your health care provider. Do not take aspirin, ibuprofen, or other NSAIDs unless your health care provider told you to do so.  Keep all follow-up visits as told by your health care provider. This is important. This information is not intended to replace advice given to you by your health care provider. Make sure you discuss any questions you have with your health care provider. Document Released: 07/29/2005 Document Revised: 04/27/2018 Document Reviewed: 04/27/2018 Elsevier Interactive Patient Education  2019 [employer].

## 2018-11-11 ENCOUNTER — Encounter: Payer: Self-pay | Admitting: Internal Medicine

## 2018-11-14 ENCOUNTER — Encounter (HOSPITAL_COMMUNITY): Payer: Self-pay | Admitting: Internal Medicine

## 2018-12-09 ENCOUNTER — Other Ambulatory Visit: Payer: Self-pay | Admitting: Cardiology

## 2019-01-13 ENCOUNTER — Other Ambulatory Visit: Payer: Self-pay | Admitting: Cardiology

## 2019-02-07 ENCOUNTER — Encounter: Payer: Self-pay | Admitting: Nurse Practitioner

## 2019-02-07 ENCOUNTER — Ambulatory Visit (INDEPENDENT_AMBULATORY_CARE_PROVIDER_SITE_OTHER): Payer: Medicare Other | Admitting: Nurse Practitioner

## 2019-02-07 ENCOUNTER — Other Ambulatory Visit: Payer: Self-pay

## 2019-02-07 DIAGNOSIS — Z800 Family history of malignant neoplasm of digestive organs: Secondary | ICD-10-CM | POA: Insufficient documentation

## 2019-02-07 DIAGNOSIS — K2210 Ulcer of esophagus without bleeding: Secondary | ICD-10-CM | POA: Insufficient documentation

## 2019-02-07 DIAGNOSIS — K7460 Unspecified cirrhosis of liver: Secondary | ICD-10-CM

## 2019-02-07 MED ORDER — OMEPRAZOLE 40 MG PO CPDR: 40.0000 mg | DELAYED_RELEASE_CAPSULE | Freq: Every day | ORAL | 5 refills | Status: DC

## 2019-02-07 NOTE — Assessment & Plan Note (Signed)
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  He is currently clinically doing quite well.  His labs look good with a meld score of about 7 and child Pugh class A.  Abdominal imaging up-to-date.  He does have some intermittent, mild thrombocytopenia although his last platelet count was 146.  No varices on EGD, next due for screening in 2 years.

## 2019-02-07 NOTE — Assessment & Plan Note (Signed)
The patient's colonoscopy is up-to-date 2017 and initially recommended a 10-year repeat (2027).  However, about a year after his colonoscopy his brother was diagnosed with rectal cancer and is doing well status post surgical treatment.  We have updated the patient's call back for colonoscopy to 5 years after her last exam (2022) due to new revelation of primary family relative with CRC.  He is in agreement and quite supportive of this.

## 2019-02-07 NOTE — Addendum Note (Signed)
Addended by: Delane Ginger, Osias Resnick A on: 02/07/2019 11:47 AM   Modules accepted: Orders

## 2019-02-07 NOTE — Patient Instructions (Signed)
Your health issues we discussed today were:   Inflammation of the stomach and esophagus: 1. I am sorry you had a bad reaction to Protonix 2. I am sending in omeprazole (Prilosec) 40 mg.  Take this once a day, 30 minutes before your first meal the day 3. Call us if you have any side effects or problems  Cirrhosis: 1. You appear to be doing quite well with your liver disease.  Your labs and liver function look great.  No signs of any liver cancer.   2. We will have you follow-up in 6 months at which point we can update your labs and imaging. 3. You will need a repeat upper endoscopy in 2 years  Overall I recommend:  1. Return for follow-up in 6 months 2. Call us if you have any questions or concerns   Because of recent events of COVID-19 ("Coronavirus"), follow CDC recommendations:  1. Wash your hand frequently 2. Avoid touching your face 3. Stay away from people who are sick 4. If you have symptoms such as fever, cough, shortness of breath then call your healthcare provider for further guidance 5. If you are sick, STAY AT HOME unless otherwise directed by your healthcare provider. 6. Follow directions from state and national officials regarding staying safe   At [facility] we value your feedback. You may receive a survey about your visit today. Please share your experience as we strive to create trusting relationships with our patients to provide genuine, compassionate, quality care.  We appreciate your understanding and patience as we review any laboratory studies, imaging, and other diagnostic tests that are ordered as we care for you. Our office policy is 5 business days for review of these results, and any emergent or urgent results are addressed in a timely manner for your best interest. If you do not hear from our office in 1 week, please contact us.   We also encourage the use of MyChart, which contains your medical information for your review as well. If you are  not enrolled in this feature, an access code is on this after visit summary for your convenience. Thank you for allowing [place] to be involved in your care.  It was great to see you today!  I hope you have a great day!!

## 2019-02-07 NOTE — Progress Notes (Signed)
Referring Provider: Avis Epley, PA* Primary Care Physician:  Eric Epley, PA-C Primary GI:  Dr. Jena Gauss  NOTE: Service was provided via telemedicine and was requested by the patient due to COVID-19 pandemic.  Method of visit: Telephone  Patient Location: Home  Provider Location: Office  Reason for Phone Visit: Follow-up  The patient was consented to phone follow-up via telephone encounter including billing of the encounter (yes/no): yes  Persons present on the phone encounter, with roles: N/A  Total time (minutes) spent on medical discussion: 24 minutes  Chief Complaint  Patient presents with  . Follow-up    doing good, stopped taking pantorozole due to alot of issues with gas.    HPI:   Eric Dougherty is a 68 y.o. male who presents for virtual visit regarding: follow-up on cirrhosis.  The patient was last seen in our office 09/05/2018 for cirrhosis, portal hypertension, thrombocytopenia.  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  Colonoscopy up-to-date 2017 and next due in 2027.  He stopped drinking alcohol when he was officially diagnosed with cirrhosis.  Cirrhosis was evaluated which found thrombocytopenia, normal INR, hepatitis B and C- but no documented immunity to hepatitis B.  Autoimmune labs normal.  Query fatty liver disease and alcohol etiologies.  Recommended hepatitis B vaccinations.  Dominant ultrasound elastography found fibrosis score of F2/F3 but noted coarse hepatic echotexture with mild nodular hepatic contour suggesting possibility of early cirrhosis.  No focal hepatic lesions.  At his last visit he was doing well, no overt GI or hepatic symptoms.  Recommended updated cirrhosis labs, routine ultrasound, upper endoscopy, follow-up in 6 months.  Labs completed 10/10/2018 which found low normal platelet count at 146, normal LFTs, alkaline phosphatase, bilirubin, normal INR, normal AFP.  Right upper  quadrant ultrasound found gallbladder sludge without definite stone, borderline gallbladder wall thickening but no definite cholecystitis, stable echogenic appearance of the liver consistent with cirrhosis and no discrete lesions identified.  EGD completed on 11/09/2018 which found erosive reflux esophagitis, erythematous mucosa in the stomach status post biopsy, otherwise normal.  Recommended Protonix 40 mg daily, regular coffee consumption.  Repeat EGD in 2 years.  No documented varices.  Surgical pathology found the biopsies to be mild chronic gastritis.  Today he states he's doing well. Protonix caused gas so he stopped it at which point the gas resolved. Denies abdominal pain, N/V, hematochezia, melena, fever, chills, unintentional weight loss. Denies yellowing of skin/eyes, darkened urine, acute episodic confusion, tremors/shakes. Still abstaining from alcohol. Denies chest pain, dyspnea, dizziness, lightheadedness, syncope, near syncope. Denies any other upper or lower GI symptoms.  Past Medical History:  Diagnosis Date  . Diabetes mellitus    resolution of symptoms  . Hypertension    no longer on meds  . PONV (postoperative nausea and vomiting)   . Sciatica     Past Surgical History:  Procedure Laterality Date  . BIOPSY  11/09/2018   Procedure: BIOPSY;  Surgeon: Corbin Ade, MD;  Location: [facility];  Service: Endoscopy;;  gastric   . COLONOSCOPY  11/20/2005   RMR: Minimal hemorrhoids, otherwise normal  . COLONOSCOPY N/A 03/25/2016   Procedure: COLONOSCOPY;  Surgeon: Corbin Ade, MD;  Location: [facility];  Service: Endoscopy;  Laterality: N/A;  0915  . ESOPHAGOGASTRODUODENOSCOPY N/A 11/09/2018   Procedure: ESOPHAGOGASTRODUODENOSCOPY (EGD);  Surgeon: Corbin Ade, MD;  Location: [facility];  Service: Endoscopy;  Laterality: N/A;  7:30am  . HEMORRHOID  SURGERY    . HERNIA REPAIR    . LITHOTRIPSY    . TONSILLECTOMY    . TOOTH EXTRACTION      Current Outpatient  Medications  Medication Sig Dispense Refill  . aspirin EC 81 MG tablet Take 81 mg by mouth daily.    Marland Kitchen. atorvastatin (LIPITOR) 40 MG tablet TAKE 1 TABLET ONCE DAILY. 90 tablet 3  . latanoprost (XALATAN) 0.005 % ophthalmic solution Place 1 drop into both eyes at bedtime.    Marland Kitchen. lisinopril (PRINIVIL,ZESTRIL) 2.5 MG tablet Take 2.5 mg by mouth daily.    . metFORMIN (GLUCOPHAGE-XR) 500 MG 24 hr tablet Take 500 mg by mouth daily with breakfast.     . naproxen sodium (ALEVE) 220 MG tablet Take 440 mg by mouth daily as needed (pain).    . tadalafil (CIALIS) 5 MG tablet Take 5 mg by mouth daily.     . tamsulosin (FLOMAX) 0.4 MG CAPS capsule Take 0.4 mg by mouth daily.      No current facility-administered medications for this visit.     Allergies as of 02/07/2019  . (No Known Allergies)    Family History  Problem Relation Age of Onset  . Leukemia Father   . Hypothyroidism Brother   . Glaucoma Brother   . Rectal cancer Brother [id number]       s/p surgical resection; dx 2 years ago  . Osteoarthritis Other   . Prostate cancer Paternal Grandfather   . Cirrhosis Cousin     Social History   Socioeconomic History  . Marital status: Married    Spouse name: Not on file  . Number of children: Not on file  . Years of education: Not on file  . Highest education level: Not on file  Occupational History  . Not on file  Social Needs  . Financial resource strain: Not on file  . Food insecurity:    Worry: Not on file    Inability: Not on file  . Transportation needs:    Medical: Not on file    Non-medical: Not on file  Tobacco Use  . Smoking status: Former Smoker    Last attempt to quit: 11/02/1998    Years since quitting: 20.2  . Smokeless tobacco: Former NeurosurgeonUser    Types: Chew    Quit date: 11/02/1978  Substance and Sexual Activity  . Alcohol use: Not Currently    Alcohol/week: 7.0 standard drinks    Types: 7 Standard drinks or equivalent per week    Comment: 2-3 at a time; 2-3 times a week;  previously: none in 2019 but did have 1 drink in June on vacation.  . Drug use: No  . Sexual activity: Not on file  Lifestyle  . Physical activity:    Days per week: Not on file    Minutes per session: Not on file  . Stress: Not on file  Relationships  . Social connections:    Talks on phone: Not on file    Gets together: Not on file    Attends religious service: Not on file    Active member of club or organization: Not on file    Attends meetings of clubs or organizations: Not on file    Relationship status: Not on file  Other Topics Concern  . Not on file  Social History Narrative  . Not on file    Review of Systems: General: Negative for anorexia, weight loss, fever, chills, fatigue, weakness. ENT: Negative for hoarseness, difficulty swallowing. CV:  Negative for chest pain, angina, palpitations, peripheral edema.  Respiratory: Negative for dyspnea at rest, cough, sputum, wheezing.  GI: See history of present illness. Derm: Negative for itching.  Neuro: Negative for confusion.  Endo: Negative for unusual weight change.  Heme: Negative for bruising or bleeding. Allergy: Negative for rash or hives.  Physical Exam: Note: limited exam due to virtual visit General:   Alert and oriented. Pleasant and cooperative.  Ears:  Normal auditory acuity. Skin:  Intact without facial significant lesions or rashes. Neurologic:  Alert and oriented x4 Psych:  Alert and cooperative. Normal mood and affect.

## 2019-02-07 NOTE — Assessment & Plan Note (Signed)
Documented erosive esophagitis as well as mild gastritis on EGD looking for varices due to chronic liver disease.  He was started on Protonix daily but this caused significant bloating and gas which resolved after he stopped taking it.  He is amenable to trying a different option.  I will send in omeprazole 40 mg daily.  Call with any problems or questions.  Follow-up in 6 months otherwise.

## 2019-02-08 ENCOUNTER — Encounter: Payer: Self-pay | Admitting: Internal Medicine

## 2019-02-08 NOTE — Progress Notes (Signed)
CC'ED TO PCP 

## 2019-03-07 ENCOUNTER — Ambulatory Visit: Payer: Medicare Other | Admitting: Nurse Practitioner

## 2019-06-02 ENCOUNTER — Encounter: Payer: Self-pay | Admitting: Internal Medicine

## 2019-08-17 ENCOUNTER — Ambulatory Visit: Payer: Medicare Other | Admitting: Nurse Practitioner

## 2019-08-21 ENCOUNTER — Other Ambulatory Visit: Payer: Self-pay

## 2019-08-21 ENCOUNTER — Ambulatory Visit: Payer: Medicare Other | Admitting: Nurse Practitioner

## 2019-08-21 ENCOUNTER — Encounter: Payer: Self-pay | Admitting: Nurse Practitioner

## 2019-08-21 VITALS — BP 133/68 | HR 59 | Temp 95.8°F | Ht 69.0 in | Wt 244.6 lb

## 2019-08-21 DIAGNOSIS — K7460 Unspecified cirrhosis of liver: Secondary | ICD-10-CM | POA: Diagnosis not present

## 2019-08-21 DIAGNOSIS — D696 Thrombocytopenia, unspecified: Secondary | ICD-10-CM | POA: Diagnosis not present

## 2019-08-21 DIAGNOSIS — K2210 Ulcer of esophagus without bleeding: Secondary | ICD-10-CM | POA: Diagnosis not present

## 2019-08-21 MED ORDER — DEXILANT 60 MG PO CPDR: 60.0000 mg | DELAYED_RELEASE_CAPSULE | Freq: Every day | ORAL | 3 refills | Status: DC

## 2019-08-21 NOTE — Assessment & Plan Note (Signed)
Noted mild thrombocytopenia with most recent platelet count 146.  This supports the idea of concomitant portal hypertension and need for EGD screening.  His EGD is up-to-date and will proceed with repeat inflammatory anterior recommendations.  Updated liver labs and imaging as per above.  Follow-up in 6 months.

## 2019-08-21 NOTE — Patient Instructions (Signed)
Your health issues we discussed today were:   Cirrhosis and portal hypertension (high blood pressure in the liver as a result of liver disease): 1. We will update your liver labs.  Have these checked as soon as you can 2. We will help schedule an ultrasound of your liver 3. Call us if you have any worsening or severe symptoms  Erosive esophagitis (irritated swallowing tube due to excessive acid): 1. I am sorry you have not had a good month with Protonix or Prilosec 2. I will send in a prescription of Dexilant to take once a day, on an empty stomach 3. Further medication changes based on how you do on Dexilant versus insurance requirements 4. Call us if he has a worsening or severe symptoms   Overall I recommend:  1. Continue other current medications 2. Continue to avoid alcohol 3. Return for follow-up in 6 months 4. Because of any questions or concerns.   Because of recent events of COVID-19 ("Coronavirus"), follow CDC recommendations:  Wash your hand frequently Avoid touching your face Stay away from people who are sick If you have symptoms such as fever, cough, shortness of breath then call your healthcare provider for further guidance If you are sick, STAY AT HOME unless otherwise directed by your healthcare provider. Follow directions from state and national officials regarding staying safe   At [facility] we value your feedback. You may receive a survey about your visit today. Please share your experience as we strive to create trusting relationships with our patients to provide genuine, compassionate, quality care.  We appreciate your understanding and patience as we review any laboratory studies, imaging, and other diagnostic tests that are ordered as we care for you. Our office policy is 5 business days for review of these results, and any emergent or urgent results are addressed in a timely manner for your best interest. If you do not hear from our office  in 1 week, please contact us.   We also encourage the use of MyChart, which contains your medical information for your review as well. If you are not enrolled in this feature, an access code is on this after visit summary for your convenience. Thank you for allowing [place] to be involved in your care.  It was great to see you today!  I hope you have a great Fall!!

## 2019-08-21 NOTE — Assessment & Plan Note (Signed)
Erosive esophagitis on recent EGD and recommended continue Protonix.  However, he developed gaseous symptoms on Protonix so we switched him to Prilosec.  On Prilosec he developed diarrhea and nausea which resolved after stopping Prilosec.  At this point I will send in a prescription for Dexilant to see if this works any better.  Further adjustments per patient clinical response and/or insurance requirements.  Follow-up in 6 months.

## 2019-08-21 NOTE — Assessment & Plan Note (Signed)
Noted cirrhosis likely multifactorial in nature including metabolic syndrome/fatty liver, alcoholism.  He is currently not drinking any alcohol.  His EGD is up-to-date 2020 with recommended repeat in 2 years (2022).  At this point he is due for updated labs and imaging.  We will schedule this for him.  He is generally asymptomatic from a hepatic standpoint.  Further recommendations related to esophagitis below.  Follow-up in 6 months.

## 2019-08-21 NOTE — Progress Notes (Signed)
Referring Provider: Avis EpleyJackson, Samantha J, PA* Primary Care Physician:  Avis EpleyJackson, Samantha J, PA-C Primary GI:  Dr. Jena Gaussourk  Chief Complaint  Patient presents with   Cirrhosis    doing ok    HPI:   Eric Dougherty is a 68 y.o. male who presents for follow-up of cirrhosis.  The patient was last seen in our office 02/07/2019 for cirrhosis, erosive esophagitis, family history of rectal cancer.  The patient's last visit was a virtual office visit due to COVID-19/coronavirus pandemic.  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  History of thrombocytopenia.  Hepatitis B serologies negative, hepatitis C serologies negative but no documented immunity to hepatitis B.  Right upper quadrant ultrasound with gallbladder sludge, borderline gallbladder wall thickening but no definitive cholecystitis, stable echogenic appearance of the liver consistent with cirrhosis without discrete lesions.  Most recent labs found mildly low platelet count 146. Normal LFTs, alkaline phosphatase, bilirubin, INR, AFP.  EGD up-to-date 11/09/2018 with erosive reflux esophagitis, erythematous mucosa in the stomach status post biopsy found to be mild chronic gastritis.  Recommend continue Protonix 40 mg daily, regular coffee consumption, repeat EGD in 2 years (2022).  At his last visit he stated he was doing well and noted Protonix causing excessive gas so he stopped taking Protonix.  No other GI complaints.  Recommended trying Prilosec 40 mg daily, follow-up 6 months for office visit and update labs and imaging.  Today he states he's doing ok overall. Stopped taking Prilosec because it was causing diarrhea and nausea so he stopped taking it and symptoms resolved. (Has also tried/failed Protonix). Has not tried any other PPIs. Denies abdominal pain, N/V, hematochezia, melena, fever, chills, unintentional weight loss. Denies yellowing of skin/eyes, darkened urine, tremors, acute episodic  confusion, generalized pruritis. Denies URI or flu-like symptoms. Denies loss of sense of taste or smell. Denies chest pain, dyspnea, dizziness, lightheadedness, syncope, near syncope. Denies any other upper or lower GI symptoms.  Last colonoscopy found hyperplastic polyps x 3, recommended 10 year repeat exam. Since then his brother had rectal CA and he is now high risk so we will change his TCS interval to 5 years (2022).  Past Medical History:  Diagnosis Date   Diabetes mellitus    resolution of symptoms   Hypertension    no longer on meds   PONV (postoperative nausea and vomiting)    Sciatica     Past Surgical History:  Procedure Laterality Date   BIOPSY  11/09/2018   Procedure: BIOPSY;  Surgeon: Corbin Adeourk, Robert M, MD;  [facility];  Service: Endoscopy;;  gastric    COLONOSCOPY  11/20/2005   RMR: Minimal hemorrhoids, otherwise normal   COLONOSCOPY N/A 03/25/2016   Procedure: COLONOSCOPY;  Surgeon: Corbin Adeobert M Rourk, MD;  Location: [facility];  Service: Endoscopy;  Laterality: N/A;  0915   ESOPHAGOGASTRODUODENOSCOPY N/A 11/09/2018   Procedure: ESOPHAGOGASTRODUODENOSCOPY (EGD);  Surgeon: Corbin Adeourk, Robert M, MD;  Location: [facility];  Service: Endoscopy;  Laterality: N/A;  7:30am   HEMORRHOID SURGERY     HERNIA REPAIR     LITHOTRIPSY     TONSILLECTOMY     TOOTH EXTRACTION      Current Outpatient Medications  Medication Sig Dispense Refill   aspirin EC 81 MG tablet Take 81 mg by mouth daily.     atorvastatin (LIPITOR) 40 MG tablet TAKE 1 TABLET ONCE DAILY. 90 tablet 3   latanoprost (XALATAN) 0.005 % ophthalmic solution Place 1  drop into both eyes at bedtime.     lisinopril (PRINIVIL,ZESTRIL) 2.5 MG tablet Take 2.5 mg by mouth daily.     metFORMIN (GLUCOPHAGE) 1000 MG tablet Take 1 tablet by mouth 2 (two) times daily.     naproxen sodium (ALEVE) 220 MG tablet Take 440 mg by mouth daily as needed (pain).     tadalafil (CIALIS) 5 MG tablet Take 5 mg by  mouth daily.      tamsulosin (FLOMAX) 0.4 MG CAPS capsule Take 0.4 mg by mouth daily.      No current facility-administered medications for this visit.     Allergies as of 08/21/2019   (No Known Allergies)    Family History  Problem Relation Age of Onset   Leukemia Father    Hypothyroidism Brother    Glaucoma Brother    Rectal cancer Brother 55       s/p surgical resection; dx 2 years ago   Osteoarthritis Other    Prostate cancer Paternal Grandfather    Cirrhosis Cousin     Social History   Socioeconomic History   Marital status: Married    Spouse name: Not on file   Number of children: Not on file   Years of education: Not on file   Highest education level: Not on file  Occupational History   Not on file  Social Needs   Financial resource strain: Not on file   Food insecurity    Worry: Not on file    Inability: Not on file   Transportation needs    Medical: Not on file    Non-medical: Not on file  Tobacco Use   Smoking status: Former Smoker    Quit date: 11/02/1998    Years since quitting: 20.8   Smokeless tobacco: Former Neurosurgeon    Types: Chew    Quit date: 11/02/1978  Substance and Sexual Activity   Alcohol use: Not Currently    Alcohol/week: 7.0 standard drinks    Types: 7 Standard drinks or equivalent per week    Comment: 2-3 at a time; 2-3 times a week; previously: none in 2019 but did have 1 drink in June on vacation.   Drug use: No   Sexual activity: Not on file  Lifestyle   Physical activity    Days per week: Not on file    Minutes per session: Not on file   Stress: Not on file  Relationships   Social connections    Talks on phone: Not on file    Gets together: Not on file    Attends religious service: Not on file    Active member of club or organization: Not on file    Attends meetings of clubs or organizations: Not on file    Relationship status: Not on file  Other Topics Concern   Not on file  Social History Narrative    Not on file    Review of Systems: General: Negative for anorexia, weight loss, fever, chills, fatigue, weakness. ENT: Negative for hoarseness, difficulty swallowing. CV: Negative for chest pain, angina, palpitations, peripheral edema.  Respiratory: Negative for dyspnea at rest, cough, sputum, wheezing.  GI: See history of present illness. MS: Negative for joint pain, low back pain.  Derm: Negative for rash or itching.  Neuro: Negative for memory loss, confusion.  Endo: Negative for unusual weight change.  Heme: Negative for bruising or bleeding. Allergy: Negative for rash or hives.   Physical Exam: BP 133/68    Pulse (!) 59  Temp (!) 95.8 F (35.4 C) (Temporal)    Ht 5\' 9"  (1.753 m)    Wt 244 lb 9.6 oz (110.9 kg)    BMI 36.12 kg/m  General:   Alert and oriented. Pleasant and cooperative. Well-nourished and well-developed.  Eyes:  Without icterus, sclera clear and conjunctiva pink.  Ears:  Normal auditory acuity. Cardiovascular:  S1, S2 present without murmurs appreciated. Extremities without clubbing or edema. Respiratory:  Clear to auscultation bilaterally. No wheezes, rales, or rhonchi. No distress.  Gastrointestinal:  +BS, soft, non-tender and non-distended. No HSM noted. No guarding or rebound. No masses appreciated.  Rectal:  Deferred  Musculoskalatal:  Symmetrical without gross deformities. Neurologic:  Alert and oriented x4;  grossly normal neurologically. Psych:  Alert and cooperative. Normal mood and affect. Heme/Lymph/Immune: No excessive bruising noted.    08/21/2019 10:16 AM   Disclaimer: This note was dictated with voice recognition software. Similar sounding words can inadvertently be transcribed and may not be corrected upon review.

## 2019-08-22 LAB — CBC WITH DIFFERENTIAL/PLATELET
Absolute Monocytes: 489 cells/uL (ref 200–950)
Basophils Absolute: 47 cells/uL (ref 0–200)
Basophils Relative: 0.7 %
Eosinophils Absolute: 121 cells/uL (ref 15–500)
Eosinophils Relative: 1.8 %
HCT: 42.5 % (ref 38.5–50.0)
Hemoglobin: 14.1 g/dL (ref 13.2–17.1)
Lymphs Abs: 1688 cells/uL (ref 850–3900)
MCH: 30.3 pg (ref 27.0–33.0)
MCHC: 33.2 g/dL (ref 32.0–36.0)
MCV: 91.4 fL (ref 80.0–100.0)
MPV: 12.7 fL — ABNORMAL HIGH (ref 7.5–12.5)
Monocytes Relative: 7.3 %
Neutro Abs: 4355 cells/uL (ref 1500–7800)
Neutrophils Relative %: 65 %
Platelets: 127 10*3/uL — ABNORMAL LOW (ref 140–400)
RBC: 4.65 10*6/uL (ref 4.20–5.80)
RDW: 13.2 % (ref 11.0–15.0)
Total Lymphocyte: 25.2 %
WBC: 6.7 10*3/uL (ref 3.8–10.8)

## 2019-08-22 LAB — COMPREHENSIVE METABOLIC PANEL
AG Ratio: 1.7 (calc) (ref 1.0–2.5)
ALT: 22 U/L (ref 9–46)
AST: 25 U/L (ref 10–35)
Albumin: 4 g/dL (ref 3.6–5.1)
Alkaline phosphatase (APISO): 48 U/L (ref 35–144)
BUN/Creatinine Ratio: 22 (calc) (ref 6–22)
BUN: 15 mg/dL (ref 7–25)
CO2: 27 mmol/L (ref 20–32)
Calcium: 9.1 mg/dL (ref 8.6–10.3)
Chloride: 106 mmol/L (ref 98–110)
Creat: 0.68 mg/dL — ABNORMAL LOW (ref 0.70–1.25)
Globulin: 2.3 g/dL (calc) (ref 1.9–3.7)
Glucose, Bld: 107 mg/dL — ABNORMAL HIGH (ref 65–99)
Potassium: 4.5 mmol/L (ref 3.5–5.3)
Sodium: 141 mmol/L (ref 135–146)
Total Bilirubin: 0.4 mg/dL (ref 0.2–1.2)
Total Protein: 6.3 g/dL (ref 6.1–8.1)

## 2019-08-22 LAB — AFP TUMOR MARKER: AFP-Tumor Marker: 1.9 ng/mL (ref <6.1)

## 2019-08-22 LAB — PROTIME-INR
INR: 1
Prothrombin Time: 10.8 s (ref 9.0–11.5)

## 2019-08-24 ENCOUNTER — Other Ambulatory Visit: Payer: Self-pay

## 2019-08-24 ENCOUNTER — Ambulatory Visit (HOSPITAL_COMMUNITY)
Admission: RE | Admit: 2019-08-24 | Discharge: 2019-08-24 | Disposition: A | Discharge status: Home / Self Care | Payer: Medicare Other | Source: Ambulatory Visit | Attending: Nurse Practitioner | Admitting: Nurse Practitioner

## 2019-08-24 DIAGNOSIS — K2210 Ulcer of esophagus without bleeding: Secondary | ICD-10-CM | POA: Diagnosis present

## 2019-08-24 DIAGNOSIS — D696 Thrombocytopenia, unspecified: Secondary | ICD-10-CM | POA: Diagnosis present

## 2019-08-24 DIAGNOSIS — K7460 Unspecified cirrhosis of liver: Secondary | ICD-10-CM | POA: Insufficient documentation

## 2019-09-11 ENCOUNTER — Telehealth: Payer: Self-pay | Admitting: *Deleted

## 2019-09-11 NOTE — Telephone Encounter (Signed)
Routing message to EG, pt is inquiring about results.

## 2019-09-11 NOTE — Telephone Encounter (Signed)
Wants u/s results.  [phone]

## 2019-09-15 NOTE — Telephone Encounter (Signed)
Results are already in the imaging results note and were communicated to the patient.

## 2019-11-01 ENCOUNTER — Ambulatory Visit: Payer: Medicare Other | Attending: Internal Medicine

## 2019-11-01 ENCOUNTER — Other Ambulatory Visit: Payer: Self-pay

## 2019-11-01 DIAGNOSIS — Z20822 Contact with and (suspected) exposure to covid-19: Secondary | ICD-10-CM

## 2019-11-02 LAB — NOVEL CORONAVIRUS, NAA: SARS-CoV-2, NAA: DETECTED — AB

## 2019-11-03 ENCOUNTER — Telehealth: Payer: Self-pay | Admitting: Nurse Practitioner

## 2019-11-03 NOTE — Telephone Encounter (Signed)
Called to Discuss with patient about Covid symptoms and the use of bamlanivimab, a monoclonal antibody infusion for those with mild to moderate Covid symptoms and at a high risk of hospitalization.     Pt is qualified for this infusion at the [facility] infusion center due to co-morbid conditions and/or a member of an at-risk group.     Patient Active Problem List   Diagnosis Date Noted  . Erosive esophagitis 02/07/2019  . Family history of rectal cancer 02/07/2019  . Portal hypertension (HCC) 09/05/2018  . Hepatic cirrhosis (HCC) 03/02/2018  . Thrombocytopenia (HCC) 03/02/2018  . Abnormal CT of liver 12/28/2017  . H/O ETOH abuse 12/28/2017  . Metabolic syndrome 12/28/2017  . History of colonic polyps   . Diverticulosis of colon without hemorrhage   . Encounter for screening colonoscopy 03/05/2016  . Tendonitis, Achilles, right 11/23/2013  . Achilles tendonitis, bilateral 11/23/2013    Patient will be out of the 10 day window for infusion before it can be scheduled. Symptoms tier reviewed as well as criteria for ending isolation. Preventative practices reviewed. Patient verbalized understanding.    Patient advised to go to Urgent care or ED with severe symptoms.

## 2019-12-16 ENCOUNTER — Ambulatory Visit: Payer: Medicare PPO | Attending: Internal Medicine

## 2019-12-16 DIAGNOSIS — Z23 Encounter for immunization: Secondary | ICD-10-CM | POA: Insufficient documentation

## 2019-12-16 NOTE — Progress Notes (Signed)
   Covid-19 Vaccination Clinic  Name:  Eric Dougherty    MRN: [id number] DOB: January 17, 1951  12/16/2019  Eric Dougherty was observed post Covid-19 immunization for 15 minutes without incidence. He was provided with Vaccine Information Sheet and instruction to access the V-Safe system.   Eric Dougherty was instructed to call 911 with any severe reactions post vaccine: Marland Kitchen Difficulty breathing  . Swelling of your face and throat  . A fast heartbeat  . A bad rash all over your body  . Dizziness and weakness    Immunizations Administered    Name Date Dose VIS Date Route   Moderna COVID-19 Vaccine 12/16/2019 12:34 PM 0.5 mL 10/03/2019 Intramuscular   Manufacturer: Moderna   Lot: [id number]   NDC: 76808-811-03

## 2020-01-13 ENCOUNTER — Ambulatory Visit: Payer: Medicare PPO | Attending: Internal Medicine

## 2020-01-13 DIAGNOSIS — Z23 Encounter for immunization: Secondary | ICD-10-CM

## 2020-01-13 NOTE — Progress Notes (Signed)
   Covid-19 Vaccination Clinic  Name:  TALEN POSER    MRN: [id number] DOB: 06-23-1951  01/13/2020  Mr. Adami was observed post Covid-19 immunization for 15 minutes without incident. He was provided with Vaccine Information Sheet and instruction to access the V-Safe system.   Mr. Pecore was instructed to call 911 with any severe reactions post vaccine: Marland Kitchen Difficulty breathing  . Swelling of face and throat  . A fast heartbeat  . A bad rash all over body  . Dizziness and weakness   Immunizations Administered    Name Date Dose VIS Date Route   Moderna COVID-19 Vaccine 01/13/2020 11:52 AM 0.5 mL 10/03/2019 Intramuscular   Manufacturer: Moderna   Lot: [id number]   NDC: 79150-413-64

## 2020-01-16 DIAGNOSIS — E113292 Type 2 diabetes mellitus with mild nonproliferative diabetic retinopathy without macular edema, left eye: Secondary | ICD-10-CM | POA: Diagnosis not present

## 2020-02-20 ENCOUNTER — Other Ambulatory Visit: Payer: Self-pay

## 2020-02-20 ENCOUNTER — Encounter: Payer: Self-pay | Admitting: Internal Medicine

## 2020-02-20 ENCOUNTER — Encounter: Payer: Self-pay | Admitting: Nurse Practitioner

## 2020-02-20 ENCOUNTER — Encounter: Payer: Self-pay | Admitting: *Deleted

## 2020-02-20 ENCOUNTER — Ambulatory Visit: Payer: Medicare Other | Admitting: Nurse Practitioner

## 2020-02-20 VITALS — BP 136/71 | HR 69 | Temp 96.6°F | Ht 69.0 in | Wt 242.6 lb

## 2020-02-20 DIAGNOSIS — R110 Nausea: Secondary | ICD-10-CM | POA: Diagnosis not present

## 2020-02-20 DIAGNOSIS — K7460 Unspecified cirrhosis of liver: Secondary | ICD-10-CM | POA: Diagnosis not present

## 2020-02-20 DIAGNOSIS — K766 Portal hypertension: Secondary | ICD-10-CM

## 2020-02-20 LAB — CBC WITH DIFFERENTIAL/PLATELET
Basophils Relative: 0.4 %
MCH: 30.3 pg (ref 27.0–33.0)
Neutrophils Relative %: 66.4 %

## 2020-02-20 LAB — COMPREHENSIVE METABOLIC PANEL: Calcium: 8.7 mg/dL (ref 8.6–10.3)

## 2020-02-20 NOTE — Progress Notes (Signed)
Referring Provider: Avis Epley, PA* Primary Care Physician:  Avis Epley, PA-C Primary GI:  Dr. Jena Gauss  Chief Complaint  Patient presents with  . Cirrhosis  . Nausea    Dexilant causes nausea and costs $66    HPI:   Eric Dougherty is a 69 y.o. male who presents for follow-up on cirrhosis.  The patient was last seen in our office 08/21/2019 for the same as well as thrombocytopenia and erosive esophagitis.  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  History of thrombocytopenia.  Hepatitis B serologies negative, hepatitis C serologies negative but no documented immunity to hepatitis B.  EGD up-to-date 11/09/2018 with erosive reflux esophagitis, erythematous mucosa in the stomach status post biopsy found to be mild chronic gastritis.  Recommended continue Protonix, regular coffee consumption, repeat EGD in 2 years (2022).  Since then he stopped Protonix due to excessive gas and started Prilosec 40 mg daily.  At his last visit he noted he stopped taking Prilosec because it caused diarrhea and nausea, has not tried any other PPIs.  No other overt GI or hepatic complaints.  Most recent colonoscopy with 3 polyps and recommended 10-year repeat exam; since then brother diagnosed with rectal cancer and interval change to 5 years (2022).  Recommended updated labs and imaging, trial of Dexilant daily, complete alcohol abstinence, follow-up in 6 months.  Labs completed 08/21/2019 with persistent thrombocytopenia at 127, otherwise normal.  CMP with normal LFTs, INR normal, AFP normal.  Calculated MELD: 6, Child-Pugh: A.   Right upper quadrant abdominal ultrasound completed 08/24/2019 found echogenic focus in the gallbladder representing polyp versus adherent poorly calcified stone, increased echogenicity, no suspicious lesions.  Today he states he's doing ok overall. Not tolerating Dexilant (nausea and high cost of $66). Has now tried/failed  Protonix, prilosec, Dexilant. States his nausea is not too bad, just aggravates me every now and then. Wants to try and let it go for a little bit, will call if worsens. Denies abdominal pain, vomiting, hematochezia, melena, fever, chills, unintentional weight loss. Denies yellowing of skin/eyes, darkened urine, acute episodic confusion, generalized pruritis, tremors/shakes. Denies URI or flu-like symptoms. Denies loss of sense of taste or smell. Has had both COVID-19 vaccines (also previously had COVID-19 prior to vaccines). Denies chest pain, dyspnea, dizziness, lightheadedness, syncope, near syncope. Denies any other upper or lower GI symptoms.  Past Medical History:  Diagnosis Date  . Cirrhosis (HCC)   . Diabetes mellitus    resolution of symptoms  . Hypertension    no longer on meds  . PONV (postoperative nausea and vomiting)   . Sciatica     Past Surgical History:  Procedure Laterality Date  . BIOPSY  11/09/2018   Procedure: BIOPSY;  Surgeon: Corbin Ade, MD;  [facility]: [facility];  Service: Endoscopy;;  gastric   . COLONOSCOPY  11/20/2005   RMR: Minimal hemorrhoids, otherwise normal  . COLONOSCOPY N/A 03/25/2016   Procedure: COLONOSCOPY;  Surgeon: Corbin Ade, MD;  [facility];  Service: Endoscopy;  Laterality: N/A;  0915  . ESOPHAGOGASTRODUODENOSCOPY N/A 11/09/2018   Procedure: ESOPHAGOGASTRODUODENOSCOPY (EGD);  Surgeon: Corbin Ade, MD;  [facility]: [facility];  Service: Endoscopy;  Laterality: N/A;  7:30am  . HEMORRHOID SURGERY    . HERNIA REPAIR    . LITHOTRIPSY    . TONSILLECTOMY    . TOOTH EXTRACTION      Current Outpatient Medications  Medication Sig Dispense Refill  .  aspirin EC 81 MG tablet Take 81 mg by mouth daily.    Marland Kitchen atorvastatin (LIPITOR) 40 MG tablet TAKE 1 TABLET ONCE DAILY. 90 tablet 3  . dexlansoprazole (DEXILANT) 60 MG capsule Take 1 capsule (60 mg total) by mouth daily. 30 capsule 3  . latanoprost (XALATAN) 0.005 % ophthalmic  solution Place 1 drop into both eyes at bedtime.    Marland Kitchen lisinopril (PRINIVIL,ZESTRIL) 2.5 MG tablet Take 2.5 mg by mouth daily.    . metFORMIN (GLUCOPHAGE) 1000 MG tablet Take 1 tablet by mouth 2 (two) times daily.    . naproxen sodium (ALEVE) 220 MG tablet Take 440 mg by mouth daily as needed (pain).    . tadalafil (CIALIS) 5 MG tablet Take 5 mg by mouth daily.     . tamsulosin (FLOMAX) 0.4 MG CAPS capsule Take 0.4 mg by mouth daily.      No current facility-administered medications for this visit.    Allergies as of 02/20/2020  . (No Known Allergies)    Family History  Problem Relation Age of Onset  . Leukemia Father   . Hypothyroidism Brother   . Glaucoma Brother   . Rectal cancer Brother 53       s/p surgical resection; dx 2 years ago  . Osteoarthritis Other   . Prostate cancer Paternal Grandfather   . Cirrhosis Cousin     Social History   Socioeconomic History  . Marital status: Married    Spouse name: Not on file  . Number of children: Not on file  . Years of education: Not on file  . Highest education level: Not on file  Occupational History  . Not on file  Tobacco Use  . Smoking status: Former Smoker    Quit date: 11/02/1998    Years since quitting: 21.3  . Smokeless tobacco: Former Systems developer    Types: [place] date: 11/02/1978  Substance and Sexual Activity  . Alcohol use: Not Currently    Alcohol/week: 7.0 standard drinks    Types: 7 Standard drinks or equivalent per week    Comment: 2-3 at a time; 2-3 times a week; previously: none in 2019 but did have 1 drink in June on vacation.  . Drug use: No  . Sexual activity: Not on file  Other Topics Concern  . Not on file  Social History Narrative  . Not on file   Social Determinants of Health   Financial Resource Strain:   . Difficulty of Paying Living Expenses:   Food Insecurity:   . Worried About Charity fundraiser in the Last Year:   . Arboriculturist in the Last Year:   Transportation Needs:   . Consulting civil engineer (Medical):   Marland Kitchen Lack of Transportation (Non-Medical):   Physical Activity:   . Days of Exercise per Week:   . Minutes of Exercise per Session:   Stress:   . Feeling of Stress :   Social Connections:   . Frequency of Communication with Friends and Family:   . Frequency of Social Gatherings with Friends and Family:   . Attends Religious Services:   . Active Member of Clubs or Organizations:   . Attends Archivist Meetings:   Marland Kitchen Marital Status:     Subjective: Review of Systems  Constitutional: Negative for chills, fever, malaise/fatigue and weight loss.  HENT: Negative for congestion and sore throat.   Respiratory: Negative for cough and shortness of breath.   Cardiovascular: Negative  for chest pain, palpitations and leg swelling.  Gastrointestinal: Positive for nausea. Negative for abdominal pain, blood in stool, diarrhea, melena and vomiting.  Musculoskeletal: Negative for joint pain and myalgias.  Skin: Negative for itching and rash.  Neurological: Negative for dizziness, tremors and weakness.  Endo/Heme/Allergies: Does not bruise/bleed easily.  Psychiatric/Behavioral: Negative for depression and substance abuse. The patient is not nervous/anxious.   All other systems reviewed and are negative.    Objective: BP 136/71   Pulse 69   Temp (!) 96.6 F (35.9 C) (Temporal)   Ht 5\' 9"  (1.753 m)   Wt 242 lb 9.6 oz (110 kg)   BMI 35.83 kg/m  Physical Exam Vitals and nursing note reviewed.  Constitutional:      General: He is not in acute distress.    Appearance: Normal appearance. He is obese. He is not ill-appearing, toxic-appearing or diaphoretic.  HENT:     Head: Normocephalic and atraumatic.     Nose: No congestion or rhinorrhea.  Eyes:     General: No scleral icterus. Cardiovascular:     Rate and Rhythm: Normal rate and regular rhythm.     Heart sounds: Normal heart sounds.  Pulmonary:     Effort: Pulmonary effort is normal.     Breath  sounds: Normal breath sounds.  Abdominal:     General: Bowel sounds are normal. There is no distension.     Palpations: Abdomen is soft. There is no hepatomegaly, splenomegaly or mass.     Tenderness: There is no abdominal tenderness. There is no guarding or rebound.     Hernia: No hernia is present.  Musculoskeletal:     Cervical back: Neck supple.  Skin:    General: Skin is warm and dry.     Coloration: Skin is not jaundiced.     Findings: No bruising or rash.  Neurological:     General: No focal deficit present.     Mental Status: He is alert and oriented to person, place, and time. Mental status is at baseline.  Psychiatric:        Mood and Affect: Mood normal.        Behavior: Behavior normal.        Thought Content: Thought content normal.       02/20/2020 9:45 AM   Disclaimer: This note was dictated with voice recognition software. Similar sounding words can inadvertently be transcribed and may not be corrected upon review.

## 2020-02-20 NOTE — Assessment & Plan Note (Signed)
Associated portal hypertension as evidenced by mildly low platelet count at 127 at last check.  EGD is currently up-to-date and he will need a repeat endoscopy for variceal screening next year (2020).  Denies any overt bleeding, hematochezia, melena.  Overall his portal hypertension seems to be manageable.  No previous varices and subsequently no need for nonspecific beta-blockade at this time.  Further recommendations to follow his EGD next year.  Follow-up in 6 months for routine liver care.

## 2020-02-20 NOTE — Patient Instructions (Signed)
Your health issues we discussed today were:   Cirrhosis: 1. Overall, as we discussed, your liver disease looks to be doing well 2. Have your labs drawn as soon as you can 3. We will help schedule your appointment for your ultrasound 4. Further recommendations will follow your results 5. Call us for any concerning symptoms such as yellowing of your skin or eyes, sudden confusion, etc.  Nausea: 1. Based on discussion we will hold off on any other acid blockers at this time 2. Call us if your nausea becomes worse and we can make further recommendations  Overall I recommend:  1. Continue your other current medications 2. Continue the excellent job you have been doing on abstaining from alcohol over the years 3. Return for follow-up in 6 months 4. Call us if you have any questions or concerns.   ---------------------------------------------------------------  I am glad you had your COVID-19 vaccines!  Even though you are fully vaccinated you should continue to wear a mask, socially distance, and wash your hands frequently.  ---------------------------------------------------------------   At [facility] we value your feedback. You may receive a survey about your visit today. Please share your experience as we strive to create trusting relationships with our patients to provide genuine, compassionate, quality care.  We appreciate your understanding and patience as we review any laboratory studies, imaging, and other diagnostic tests that are ordered as we care for you. Our office policy is 5 business days for review of these results, and any emergent or urgent results are addressed in a timely manner for your best interest. If you do not hear from our office in 1 week, please contact us.   We also encourage the use of MyChart, which contains your medical information for your review as well. If you are not enrolled in this feature, an access code is on this after visit summary  for your convenience. Thank you for allowing [place] to be involved in your care.  It was great to see you today!  I hope you have a great Summer!!

## 2020-02-20 NOTE — Assessment & Plan Note (Signed)
Noted history of likely alcoholic cirrhosis, has been abstinent from alcohol for some time.  Review of previous labs and imaging over the long-term show pretty consistent well compensated liver disease.  Most recent meld score was 6, child Pugh class A.  He is generally asymptomatic from a hepatic standpoint at this time.  No overt signs of worsening liver decompensation.  We will check routine labs and imaging for hepatoma screening, liver function.  We will notify him of results and any recommendations based on those.  Otherwise follow-up in 6 months for routine liver care.

## 2020-02-20 NOTE — Assessment & Plan Note (Signed)
Mild, intermittent/rare nausea.  We have tried multiple PPIs including Prilosec, Protonix, Dexilant which have not been very effective for him and/or caused adverse effects.  He is wanting to hold off on any further PPI for now and see how his nausea plays out.  He will call us if he has any worsening nausea and would like to retry treatment.  Nausea is likely related to GERD type symptoms and esophagitis previously documented.  If his nausea does rebound we can consider other PPIs including AcipHex, Nexium.  If these all fail or have adverse effects we can try H2 receptor blocker to see if we have any better success.  Follow-up in 6 months.

## 2020-02-21 LAB — CBC WITH DIFFERENTIAL/PLATELET
Absolute Monocytes: 539 cells/uL (ref 200–950)
Basophils Absolute: 28 cells/uL (ref 0–200)
Eosinophils Absolute: 168 cells/uL (ref 15–500)
Eosinophils Relative: 2.4 %
HCT: 42.1 % (ref 38.5–50.0)
Hemoglobin: 13.9 g/dL (ref 13.2–17.1)
Lymphs Abs: 1617 cells/uL (ref 850–3900)
MCHC: 33 g/dL (ref 32.0–36.0)
MCV: 91.9 fL (ref 80.0–100.0)
MPV: 12 fL (ref 7.5–12.5)
Monocytes Relative: 7.7 %
Neutro Abs: 4648 cells/uL (ref 1500–7800)
Platelets: 136 10*3/uL — ABNORMAL LOW (ref 140–400)
RBC: 4.58 10*6/uL (ref 4.20–5.80)
RDW: 14.3 % (ref 11.0–15.0)
Total Lymphocyte: 23.1 %
WBC: 7 10*3/uL (ref 3.8–10.8)

## 2020-02-21 LAB — COMPREHENSIVE METABOLIC PANEL
AG Ratio: 1.6 (calc) (ref 1.0–2.5)
ALT: 37 U/L (ref 9–46)
AST: 27 U/L (ref 10–35)
Albumin: 3.9 g/dL (ref 3.6–5.1)
Alkaline phosphatase (APISO): 75 U/L (ref 35–144)
BUN/Creatinine Ratio: 27 (calc) — ABNORMAL HIGH (ref 6–22)
BUN: 16 mg/dL (ref 7–25)
CO2: 29 mmol/L (ref 20–32)
Chloride: 104 mmol/L (ref 98–110)
Creat: 0.59 mg/dL — ABNORMAL LOW (ref 0.70–1.25)
Globulin: 2.5 g/dL (calc) (ref 1.9–3.7)
Glucose, Bld: 116 mg/dL — ABNORMAL HIGH (ref 65–99)
Potassium: 4.1 mmol/L (ref 3.5–5.3)
Sodium: 141 mmol/L (ref 135–146)
Total Bilirubin: 0.5 mg/dL (ref 0.2–1.2)
Total Protein: 6.4 g/dL (ref 6.1–8.1)

## 2020-02-21 LAB — AFP TUMOR MARKER: AFP-Tumor Marker: 2.1 ng/mL (ref <6.1)

## 2020-02-21 LAB — PROTIME-INR
INR: 1
Prothrombin Time: 11.1 s (ref 9.0–11.5)

## 2020-02-27 ENCOUNTER — Ambulatory Visit (HOSPITAL_COMMUNITY)
Admission: RE | Admit: 2020-02-27 | Discharge: 2020-02-27 | Disposition: A | Discharge status: Home / Self Care | Payer: Medicare PPO | Source: Ambulatory Visit | Attending: Nurse Practitioner | Admitting: Nurse Practitioner

## 2020-02-27 ENCOUNTER — Other Ambulatory Visit: Payer: Self-pay

## 2020-02-27 DIAGNOSIS — R110 Nausea: Secondary | ICD-10-CM | POA: Diagnosis not present

## 2020-02-27 DIAGNOSIS — K7460 Unspecified cirrhosis of liver: Secondary | ICD-10-CM | POA: Diagnosis not present

## 2020-02-27 DIAGNOSIS — K766 Portal hypertension: Secondary | ICD-10-CM | POA: Diagnosis not present

## 2020-02-28 DIAGNOSIS — Z125 Encounter for screening for malignant neoplasm of prostate: Secondary | ICD-10-CM | POA: Diagnosis not present

## 2020-02-28 DIAGNOSIS — N200 Calculus of kidney: Secondary | ICD-10-CM | POA: Diagnosis not present

## 2020-03-27 DIAGNOSIS — G4733 Obstructive sleep apnea (adult) (pediatric): Secondary | ICD-10-CM | POA: Diagnosis not present

## 2020-06-16 IMAGING — US ULTRASOUND ABDOMEN LIMITED
2 series · 14 of 25 positions shown · non-contrast
Comparison: None.

CLINICAL DATA: Thrombocytopenia. Cirrhosis of the liver without
ascites. Portal hypertension.

EXAM:
ULTRASOUND ABDOMEN LIMITED RIGHT UPPER QUADRANT

[Series 1: ultrasound abdomen limited · 0.19mm/px · 13 of 52 slices shown (1 of 2)]
[im 1/52]
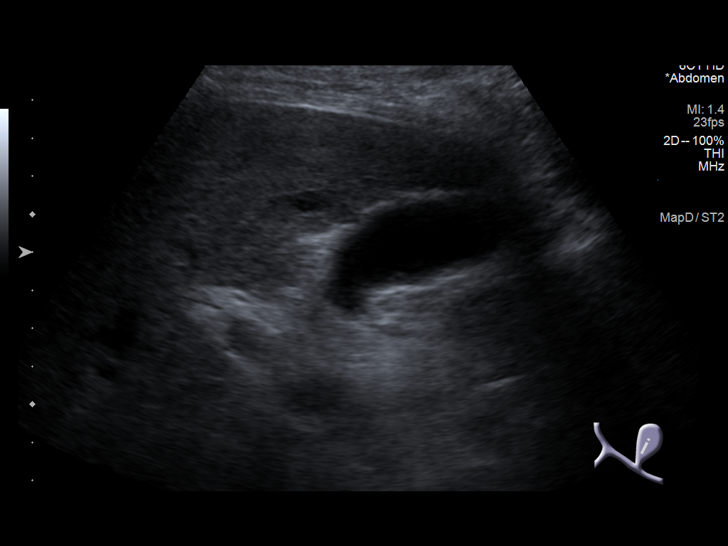
[im 5/52]
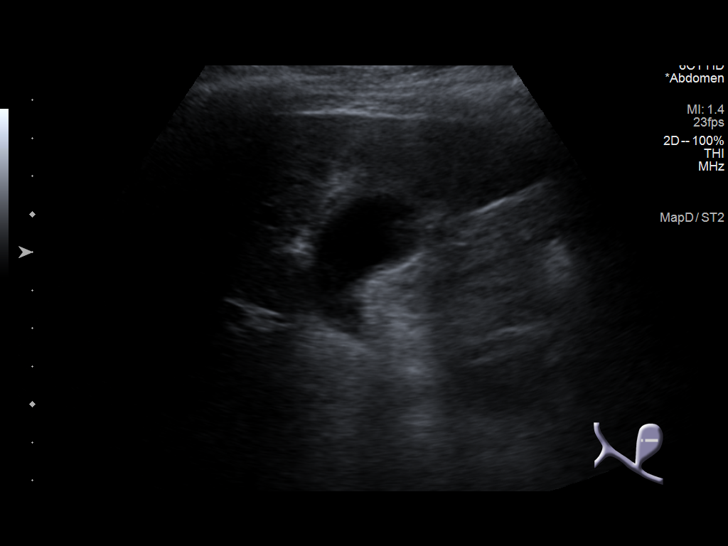
[im 9/52]
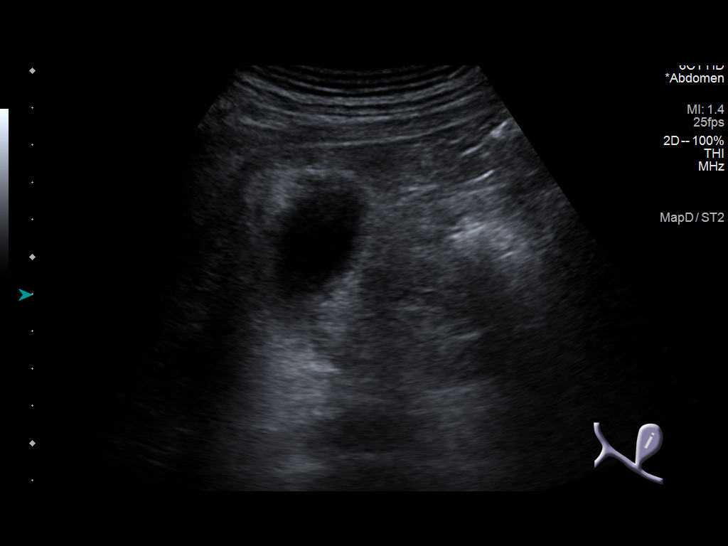
[im 14/52]
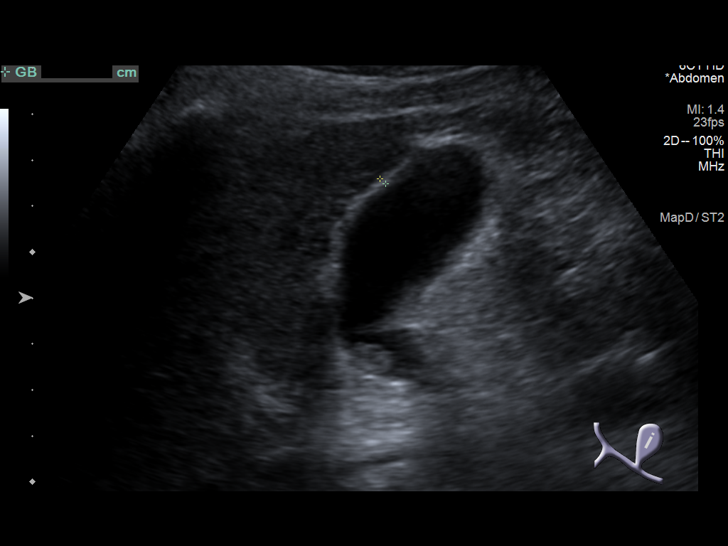
[im 18/52]
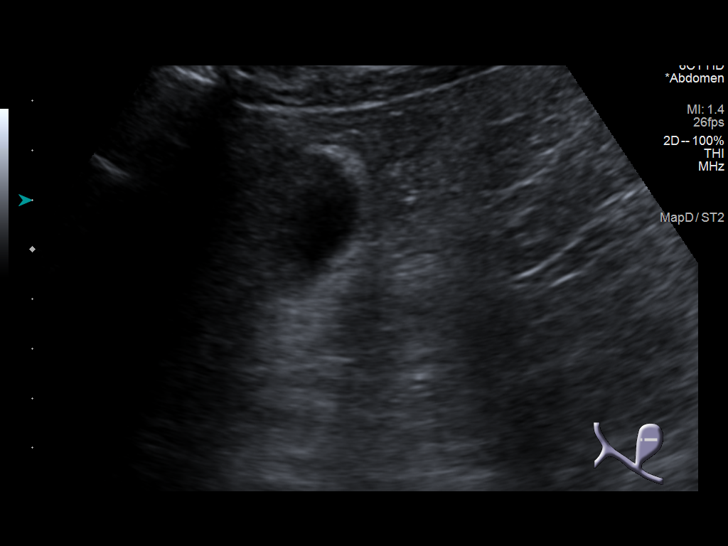
[im 20/52]
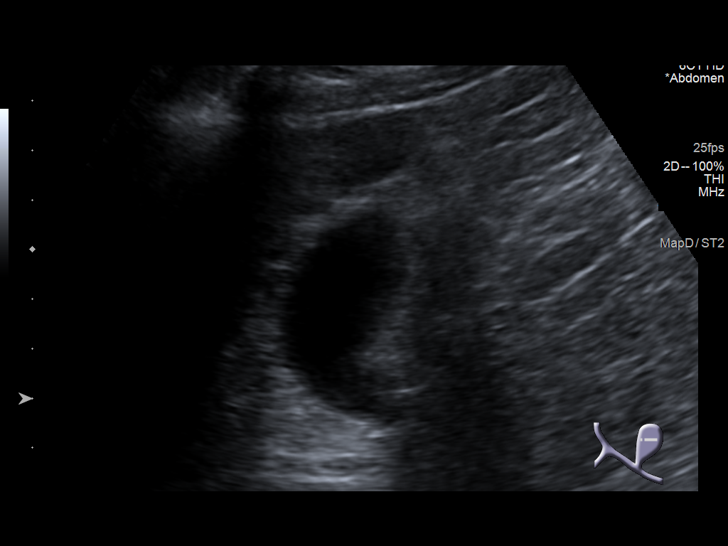
[im 25/52]
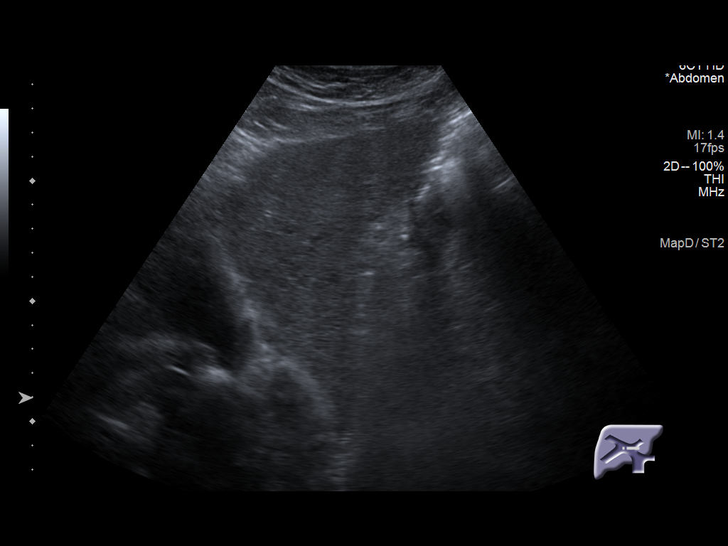
[im 29/52]
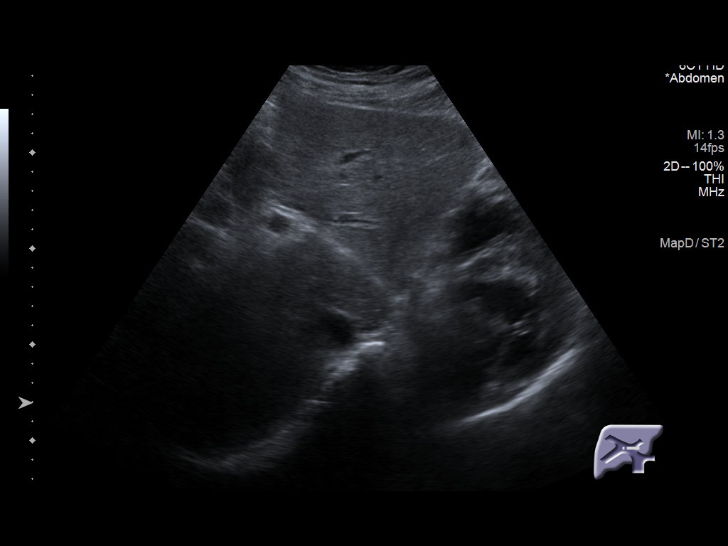
[im 34/52]
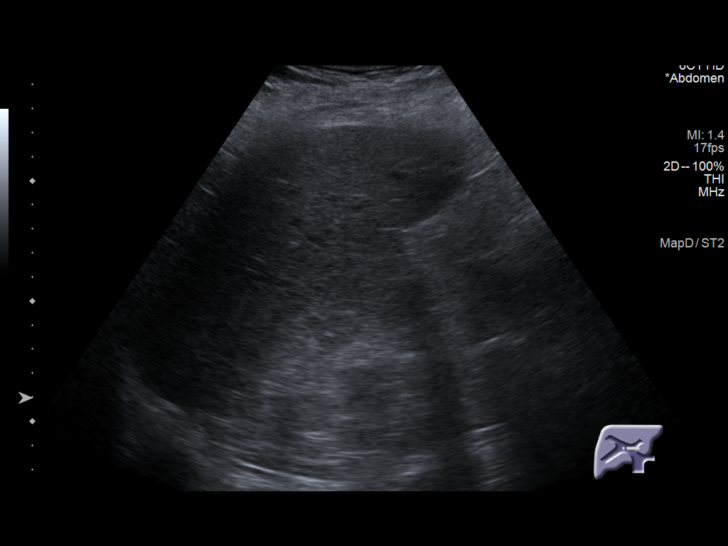
[im 36/52]
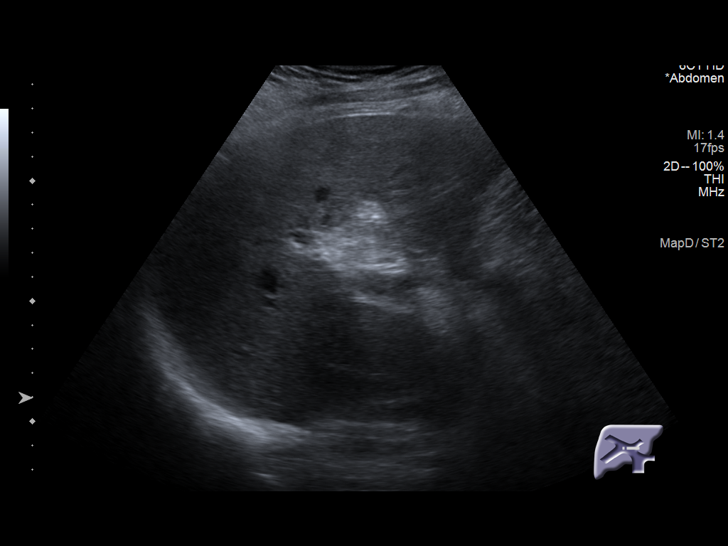
[im 40/52]
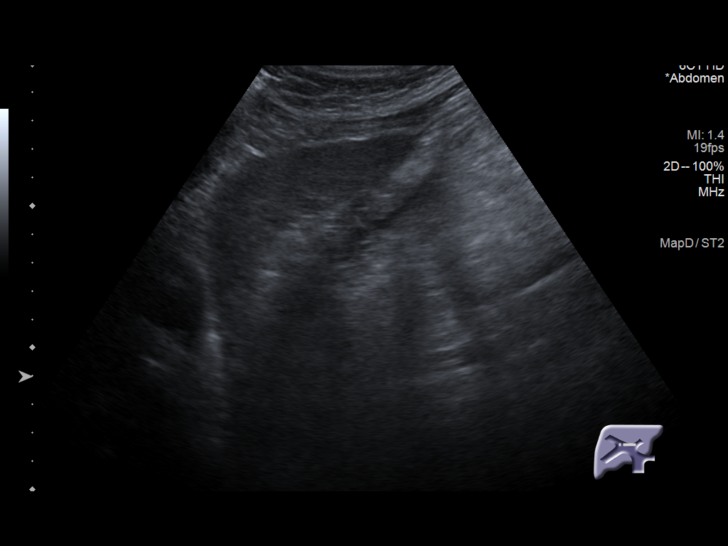
[im 45/52]
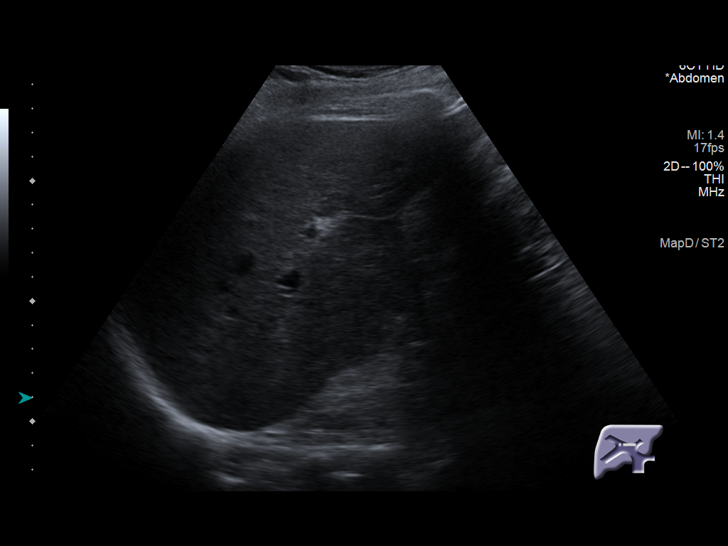
[im 49/52]
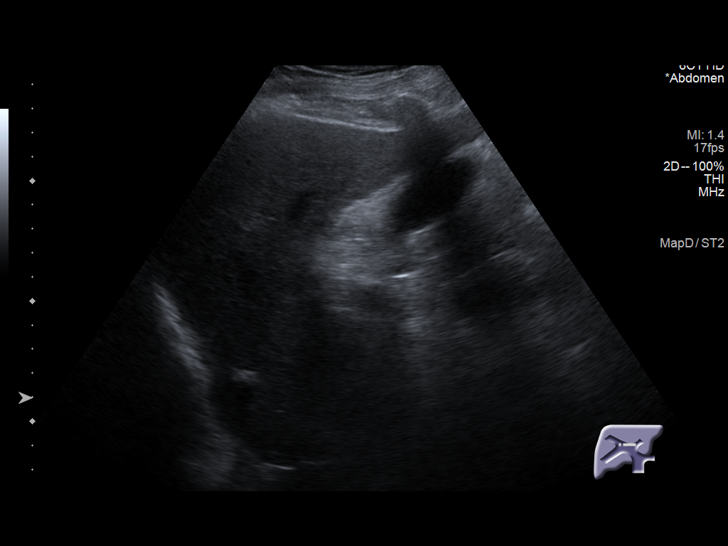

[Series 2001: ultrasound abdomen limited · 0.25mm/px · 1 of 2 slices shown (2 of 2)]
[im 1/2]
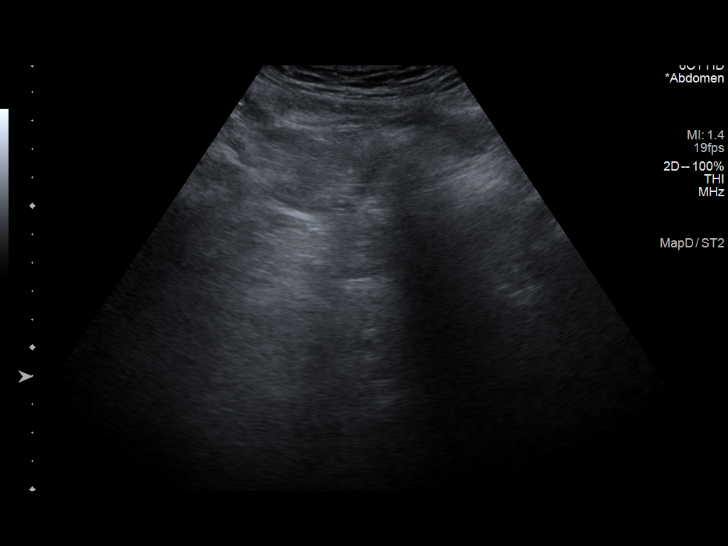

[14 of 25 positions shown; findings below may reference images not displayed]

FINDINGS: Gallbladder:

Hyperechoic material at the neck of the gallbladder is present
without shadowing, likely sludge. Wall thickness measures 3.4 mm,
mildly enlarged. No sonographic Murphy sign is reported.

Common bile duct:

Diameter: 4.2 mm, within normal limits

Liver:

The liver is diffusely echogenic. Liver contour is smooth. No focal
lesions are present. Portal vein is patent on color Doppler imaging
with normal direction of blood flow towards the liver.
IMPRESSION: 1. Gallbladder sludge without definite stone.
2. Borderline gallbladder wall thickening without sonographic Murphy
sign. No definite cholecystitis.
3. Stable echogenic appearance of the liver, consistent with the
given diagnosis of cirrhosis. No discrete lesions are present.

## 2020-07-10 DIAGNOSIS — E113293 Type 2 diabetes mellitus with mild nonproliferative diabetic retinopathy without macular edema, bilateral: Secondary | ICD-10-CM | POA: Diagnosis not present

## 2020-08-06 DIAGNOSIS — Z719 Counseling, unspecified: Secondary | ICD-10-CM | POA: Diagnosis not present

## 2020-08-06 DIAGNOSIS — E1165 Type 2 diabetes mellitus with hyperglycemia: Secondary | ICD-10-CM | POA: Diagnosis not present

## 2020-08-06 DIAGNOSIS — E7849 Other hyperlipidemia: Secondary | ICD-10-CM | POA: Diagnosis not present

## 2020-08-06 DIAGNOSIS — I10 Essential (primary) hypertension: Secondary | ICD-10-CM | POA: Diagnosis not present

## 2020-08-06 DIAGNOSIS — Z6835 Body mass index (BMI) 35.0-35.9, adult: Secondary | ICD-10-CM | POA: Diagnosis not present

## 2020-08-06 DIAGNOSIS — E6609 Other obesity due to excess calories: Secondary | ICD-10-CM | POA: Diagnosis not present

## 2020-08-06 DIAGNOSIS — E118 Type 2 diabetes mellitus with unspecified complications: Secondary | ICD-10-CM | POA: Diagnosis not present

## 2020-08-06 DIAGNOSIS — N138 Other obstructive and reflux uropathy: Secondary | ICD-10-CM | POA: Diagnosis not present

## 2020-08-06 DIAGNOSIS — Z23 Encounter for immunization: Secondary | ICD-10-CM | POA: Diagnosis not present

## 2020-08-22 ENCOUNTER — Ambulatory Visit: Payer: Medicare PPO | Admitting: Nurse Practitioner

## 2020-08-28 DIAGNOSIS — N5201 Erectile dysfunction due to arterial insufficiency: Secondary | ICD-10-CM | POA: Diagnosis not present

## 2020-08-28 DIAGNOSIS — N200 Calculus of kidney: Secondary | ICD-10-CM | POA: Diagnosis not present

## 2020-10-03 ENCOUNTER — Other Ambulatory Visit: Payer: Self-pay

## 2020-10-03 ENCOUNTER — Other Ambulatory Visit (HOSPITAL_COMMUNITY): Payer: Self-pay | Admitting: Family Medicine

## 2020-10-03 ENCOUNTER — Ambulatory Visit (HOSPITAL_COMMUNITY)
Admission: RE | Admit: 2020-10-03 | Discharge: 2020-10-03 | Disposition: A | Discharge status: Home / Self Care | Payer: Medicare PPO | Source: Ambulatory Visit | Attending: Family Medicine | Admitting: Family Medicine

## 2020-10-03 DIAGNOSIS — Z1389 Encounter for screening for other disorder: Secondary | ICD-10-CM | POA: Diagnosis not present

## 2020-10-03 DIAGNOSIS — M436 Torticollis: Secondary | ICD-10-CM | POA: Diagnosis not present

## 2020-10-03 DIAGNOSIS — Z0000 Encounter for general adult medical examination without abnormal findings: Secondary | ICD-10-CM | POA: Diagnosis not present

## 2020-10-03 DIAGNOSIS — E6609 Other obesity due to excess calories: Secondary | ICD-10-CM | POA: Diagnosis not present

## 2020-10-03 DIAGNOSIS — U071 COVID-19: Secondary | ICD-10-CM | POA: Diagnosis not present

## 2020-10-03 DIAGNOSIS — E291 Testicular hypofunction: Secondary | ICD-10-CM | POA: Diagnosis not present

## 2020-10-03 DIAGNOSIS — M542 Cervicalgia: Secondary | ICD-10-CM | POA: Diagnosis not present

## 2020-10-03 DIAGNOSIS — Z6836 Body mass index (BMI) 36.0-36.9, adult: Secondary | ICD-10-CM | POA: Diagnosis not present

## 2020-10-03 DIAGNOSIS — I10 Essential (primary) hypertension: Secondary | ICD-10-CM | POA: Diagnosis not present

## 2020-10-03 DIAGNOSIS — E118 Type 2 diabetes mellitus with unspecified complications: Secondary | ICD-10-CM | POA: Diagnosis not present

## 2020-10-03 DIAGNOSIS — Z1331 Encounter for screening for depression: Secondary | ICD-10-CM | POA: Diagnosis not present

## 2020-10-15 ENCOUNTER — Ambulatory Visit: Payer: Medicare PPO | Admitting: Nurse Practitioner

## 2020-11-07 DIAGNOSIS — U071 COVID-19: Secondary | ICD-10-CM | POA: Diagnosis not present

## 2020-11-07 DIAGNOSIS — I10 Essential (primary) hypertension: Secondary | ICD-10-CM | POA: Diagnosis not present

## 2020-11-07 DIAGNOSIS — E118 Type 2 diabetes mellitus with unspecified complications: Secondary | ICD-10-CM | POA: Diagnosis not present

## 2020-11-07 DIAGNOSIS — Z0000 Encounter for general adult medical examination without abnormal findings: Secondary | ICD-10-CM | POA: Diagnosis not present

## 2020-11-27 ENCOUNTER — Encounter: Payer: Self-pay | Admitting: Internal Medicine

## 2020-11-27 ENCOUNTER — Ambulatory Visit: Payer: Medicare PPO | Admitting: Gastroenterology

## 2020-11-27 ENCOUNTER — Encounter: Payer: Self-pay | Admitting: Gastroenterology

## 2020-11-27 ENCOUNTER — Other Ambulatory Visit: Payer: Self-pay

## 2020-11-27 ENCOUNTER — Telehealth: Payer: Self-pay | Admitting: *Deleted

## 2020-11-27 VITALS — BP 138/64 | HR 63 | Temp 97.0°F | Ht 69.0 in | Wt 248.8 lb

## 2020-11-27 DIAGNOSIS — Z800 Family history of malignant neoplasm of digestive organs: Secondary | ICD-10-CM | POA: Diagnosis not present

## 2020-11-27 DIAGNOSIS — K7469 Other cirrhosis of liver: Secondary | ICD-10-CM | POA: Diagnosis not present

## 2020-11-27 NOTE — Patient Instructions (Signed)
We are arranging a colonoscopy and upper endoscopy with Dr. Jena Gauss in the near future.  Please do not take metformin the day of the procedures.  A routine ultrasound has also been scheduled of your liver. I will try and get any recent labs from your primary care.  We will see you in 6 months for routine cirrhosis visit!  It was a pleasure to see you today. I want to create trusting relationships with patients to provide genuine, compassionate, and quality care. I value your feedback. If you receive a survey regarding your visit,  I greatly appreciate you taking time to fill this out.   Gelene Mink, PhD, ANP-BC [facility]

## 2020-11-27 NOTE — Progress Notes (Addendum)
Referring Provider: Jake Samples, PA* Primary Care Physician:  Jake Samples, PA-C Primary GI: Dr. Gala Romney   Chief Complaint  Patient presents with  . Cirrhosis    F/u  . Gastroesophageal Reflux    Doing fine. Stopped dexilant and no problems. No nausea    HPI:   Eric Dougherty is a 70 y.o. male presenting today with a history of cirrhosis likely due to ETOH, fatty liver. Hep B serologies negative, Hep C negative, completed Hep B vaccinations. EGD 11/09/2018 with erosive reflux esophagitis, erythematous mucosa in the stomach status post biopsy found to be mild chronic gastritis.  Tried and failed Protonix, Prilosec, Dexilant due to side effects (nausea, diarrhea with Prilosec). Last colonoscopy in 2017 with hyperplastic polyps. Family history of rectal cancer in brother. Due for surveillance EGD and colonoscopy this year.   He has no abdominal pain, N/V, jaundice, pruritis, dysphagia, overt GI bleeding, unexplained weight loss, lack of appetite. No GERD symptoms. He is actually due for ultrasound now. He believes he had recent labs with PCP.   Past Medical History:  Diagnosis Date  . Cirrhosis ([place])   . Diabetes mellitus    resolution of symptoms  . Hypertension    no longer on meds  . PONV (postoperative nausea and vomiting)   . Sciatica     Past Surgical History:  Procedure Laterality Date  . BIOPSY  11/09/2018   Procedure: BIOPSY;  Surgeon: Daneil Dolin, MD;  Location: [facility];  Service: Endoscopy;;  gastric   . COLONOSCOPY  11/20/2005   RMR: Minimal hemorrhoids, otherwise normal  . COLONOSCOPY N/A 03/25/2016   three semi-pedunculated polyps 4-6 mm in size, multiple medium-mouthed sigmoid diverticula (hyperplastic).   Marland Kitchen ESOPHAGOGASTRODUODENOSCOPY N/A 11/09/2018   11/09/2018 with erosive reflux esophagitis, erythematous mucosa in the stomach status post biopsy found to be mild chronic gastritis.  Marland Kitchen HEMORRHOID SURGERY    . HERNIA REPAIR    .  LITHOTRIPSY    . TONSILLECTOMY    . TOOTH EXTRACTION      Current Outpatient Medications  Medication Sig Dispense Refill  . aspirin EC 81 MG tablet Take 81 mg by mouth daily.    Marland Kitchen atorvastatin (LIPITOR) 40 MG tablet TAKE 1 TABLET ONCE DAILY. 90 tablet 3  . latanoprost (XALATAN) 0.005 % ophthalmic solution Place 1 drop into both eyes at bedtime.    Marland Kitchen lisinopril (PRINIVIL,ZESTRIL) 2.5 MG tablet Take 2.5 mg by mouth daily.    . metFORMIN (GLUCOPHAGE) 1000 MG tablet Take 1 tablet by mouth 2 (two) times daily.    . naproxen sodium (ALEVE) 220 MG tablet Take 440 mg by mouth daily as needed (pain).    . tadalafil (CIALIS) 5 MG tablet Take 5 mg by mouth daily.     . tamsulosin (FLOMAX) 0.4 MG CAPS capsule Take 0.4 mg by mouth daily.      No current facility-administered medications for this visit.    Allergies as of 11/27/2020  . (No Known Allergies)    Family History  Problem Relation Age of Onset  . Leukemia Father   . Hypothyroidism Brother   . Glaucoma Brother   . Rectal cancer Brother 75       s/p surgical resection; dx 2 years ago  . Osteoarthritis Other   . Prostate cancer Paternal Grandfather   . Cirrhosis Cousin     Social History   Socioeconomic History  . Marital status: Married    Spouse  name: Not on file  . Number of children: Not on file  . Years of education: Not on file  . Highest education level: Not on file  Occupational History  . Not on file  Tobacco Use  . Smoking status: Former Smoker    Quit date: 11/02/1998    Years since quitting: 22.1  . Smokeless tobacco: Former Systems developer    Types: [place] date: 11/02/1978  Vaping Use  . Vaping Use: Never used  Substance and Sexual Activity  . Alcohol use: Not Currently    Alcohol/week: 7.0 standard drinks    Types: 7 Standard drinks or equivalent per week    Comment: 2-3 at a time; 2-3 times a week; previously: none in 2019 but did have 1 drink in June on vacation.  . Drug use: No  . Sexual activity: Not on  file  Other Topics Concern  . Not on file  Social History Narrative  . Not on file   Social Determinants of Health   Financial Resource Strain: Not on file  Food Insecurity: Not on file  Transportation Needs: Not on file  Physical Activity: Not on file  Stress: Not on file  Social Connections: Not on file    Review of Systems: Gen: Denies fever, chills, anorexia. Denies fatigue, weakness, weight loss.  CV: Denies chest pain, palpitations, syncope, peripheral edema, and claudication. Resp: Denies dyspnea at rest, cough, wheezing, coughing up blood, and pleurisy. GI: see HPI Derm: Denies rash, itching, dry skin Psych: Denies depression, anxiety, memory loss, confusion. No homicidal or suicidal ideation.  Heme: Denies bruising, bleeding, and enlarged lymph nodes.  Physical Exam: BP 138/64   Pulse 63   Temp (!) 97 F (36.1 C)   Ht 5' 9"  (1.753 m)   Wt 248 lb 12.8 oz (112.9 kg)   BMI 36.74 kg/m  General:   Alert and oriented. No distress noted. Pleasant and cooperative.  Head:  Normocephalic and atraumatic. Eyes:  Conjuctiva clear without scleral icterus. Mouth:  Mask in place Abdomen:  +BS, round but soft. Obese. +umbilical hernia. No obvious HSM appreciated. Difficult due to AP diameter Msk:  Symmetrical without gross deformities. Normal posture. Extremities:  Without edema. Neurologic:  Alert and  oriented x4 Psych:  Alert and cooperative. Normal mood and affect.  ASSESSMENT: Eric Dougherty is a 70 y.o. 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 Due for RUQ now.  EGD last in 2020 without varices. Due for surveillance now. He is also due for colonoscopy, with last in 2017 noting hyperplastic polyps; however, he notes a family history of rectal cancer in brother.    Will pursue EGD/colonoscopy in near future. He has also had labs done recently with PCP, which we will retrieve.   PLAN:   Proceed with colonoscopy/EGD by Dr. Gala Romney in near future: the risks, benefits, and alternatives have been discussed with the patient in detail. The patient states understanding and desires to proceed.  RUQ US  Obtain outside labs  Return in 6 months  Annitta Needs, PhD, ANP-BC [facility]    Addendum: Received outside labs dated 11/08/20. COVID positive at that time. Hgb 14.1, Hct 41.7, platelets mildly low at 133. Creatinine 0.75, BUN 12, Tbili 0.7, alk phos67, AST 22, ALT 24

## 2020-11-27 NOTE — Telephone Encounter (Signed)
CALLED PT. Aware [place] appt scheduled for 1/31 at 8:30am, arrival 8:15am, npo midnight Also scheduled for tcs/egd w/ propofol, ASA 2 on 3/3, AM appt. Aware will mail prep instructions with covid test appt. Confirmed mailing address.

## 2020-11-27 NOTE — Telephone Encounter (Signed)
PA for TCS/EGD approved via [employer]. Auth# [id number] DOS 01/02/2021-02/01/2021

## 2020-12-02 ENCOUNTER — Other Ambulatory Visit: Payer: Self-pay

## 2020-12-02 ENCOUNTER — Ambulatory Visit (HOSPITAL_COMMUNITY)
Admission: RE | Admit: 2020-12-02 | Discharge: 2020-12-02 | Disposition: A | Discharge status: Home / Self Care | Payer: Medicare PPO | Source: Ambulatory Visit | Attending: Gastroenterology | Admitting: Gastroenterology

## 2020-12-02 DIAGNOSIS — K8020 Calculus of gallbladder without cholecystitis without obstruction: Secondary | ICD-10-CM | POA: Diagnosis not present

## 2020-12-02 DIAGNOSIS — K7469 Other cirrhosis of liver: Secondary | ICD-10-CM | POA: Insufficient documentation

## 2020-12-17 ENCOUNTER — Telehealth: Payer: Self-pay | Admitting: Gastroenterology

## 2020-12-17 NOTE — Telephone Encounter (Signed)
Darl Pikes, disregard. I have them

## 2020-12-17 NOTE — Telephone Encounter (Signed)
noted 

## 2020-12-17 NOTE — Telephone Encounter (Signed)
Darl Pikes, can we obtain outside labs done recently? Thanks!

## 2020-12-26 DIAGNOSIS — G4733 Obstructive sleep apnea (adult) (pediatric): Secondary | ICD-10-CM | POA: Diagnosis not present

## 2020-12-31 ENCOUNTER — Other Ambulatory Visit (HOSPITAL_COMMUNITY)
Admission: RE | Admit: 2020-12-31 | Discharge: 2020-12-31 | Disposition: A | Discharge status: Home / Self Care | Payer: Medicare PPO | Source: Ambulatory Visit | Attending: Internal Medicine | Admitting: Internal Medicine

## 2020-12-31 ENCOUNTER — Other Ambulatory Visit: Payer: Self-pay

## 2020-12-31 DIAGNOSIS — Z20822 Contact with and (suspected) exposure to covid-19: Secondary | ICD-10-CM | POA: Diagnosis not present

## 2020-12-31 DIAGNOSIS — Z01812 Encounter for preprocedural laboratory examination: Secondary | ICD-10-CM | POA: Insufficient documentation

## 2020-12-31 LAB — SARS CORONAVIRUS 2 (TAT 6-24 HRS): SARS Coronavirus 2: NEGATIVE

## 2021-01-02 ENCOUNTER — Ambulatory Visit (HOSPITAL_COMMUNITY)
Admission: RE | Admit: 2021-01-02 | Discharge: 2021-01-02 | Disposition: A | Discharge status: Home / Self Care | Payer: Medicare PPO | Attending: Internal Medicine | Admitting: Internal Medicine

## 2021-01-02 ENCOUNTER — Other Ambulatory Visit: Payer: Self-pay

## 2021-01-02 ENCOUNTER — Ambulatory Visit (HOSPITAL_COMMUNITY): Payer: Medicare PPO | Admitting: Anesthesiology

## 2021-01-02 ENCOUNTER — Encounter (HOSPITAL_COMMUNITY)
Admission: RE | Disposition: A | Discharge status: Home / Self Care | Payer: Self-pay | Source: Home / Self Care | Attending: Internal Medicine

## 2021-01-02 DIAGNOSIS — Z791 Long term (current) use of non-steroidal anti-inflammatories (NSAID): Secondary | ICD-10-CM | POA: Insufficient documentation

## 2021-01-02 DIAGNOSIS — K3189 Other diseases of stomach and duodenum: Secondary | ICD-10-CM | POA: Diagnosis not present

## 2021-01-02 DIAGNOSIS — E119 Type 2 diabetes mellitus without complications: Secondary | ICD-10-CM | POA: Diagnosis not present

## 2021-01-02 DIAGNOSIS — Z820 Family history of epilepsy and other diseases of the nervous system: Secondary | ICD-10-CM | POA: Diagnosis not present

## 2021-01-02 DIAGNOSIS — K766 Portal hypertension: Secondary | ICD-10-CM | POA: Insufficient documentation

## 2021-01-02 DIAGNOSIS — Z800 Family history of malignant neoplasm of digestive organs: Secondary | ICD-10-CM | POA: Diagnosis not present

## 2021-01-02 DIAGNOSIS — Z7984 Long term (current) use of oral hypoglycemic drugs: Secondary | ICD-10-CM | POA: Insufficient documentation

## 2021-01-02 DIAGNOSIS — Z806 Family history of leukemia: Secondary | ICD-10-CM | POA: Diagnosis not present

## 2021-01-02 DIAGNOSIS — G4730 Sleep apnea, unspecified: Secondary | ICD-10-CM | POA: Diagnosis not present

## 2021-01-02 DIAGNOSIS — Z87891 Personal history of nicotine dependence: Secondary | ICD-10-CM | POA: Insufficient documentation

## 2021-01-02 DIAGNOSIS — Z8042 Family history of malignant neoplasm of prostate: Secondary | ICD-10-CM | POA: Diagnosis not present

## 2021-01-02 DIAGNOSIS — Z8349 Family history of other endocrine, nutritional and metabolic diseases: Secondary | ICD-10-CM | POA: Insufficient documentation

## 2021-01-02 DIAGNOSIS — Z8379 Family history of other diseases of the digestive system: Secondary | ICD-10-CM | POA: Diagnosis not present

## 2021-01-02 DIAGNOSIS — Z86010 Personal history of colonic polyps: Secondary | ICD-10-CM | POA: Diagnosis not present

## 2021-01-02 DIAGNOSIS — Z7982 Long term (current) use of aspirin: Secondary | ICD-10-CM | POA: Diagnosis not present

## 2021-01-02 DIAGNOSIS — Z1211 Encounter for screening for malignant neoplasm of colon: Secondary | ICD-10-CM | POA: Diagnosis not present

## 2021-01-02 DIAGNOSIS — K7460 Unspecified cirrhosis of liver: Secondary | ICD-10-CM | POA: Diagnosis not present

## 2021-01-02 HISTORY — PX: ESOPHAGOGASTRODUODENOSCOPY (EGD) WITH PROPOFOL: SHX5813

## 2021-01-02 HISTORY — PX: COLONOSCOPY WITH PROPOFOL: SHX5780

## 2021-01-02 LAB — GLUCOSE, CAPILLARY: Glucose-Capillary: 148 mg/dL — ABNORMAL HIGH (ref 70–99)

## 2021-01-02 SURGERY — COLONOSCOPY WITH PROPOFOL
Anesthesia: General

## 2021-01-02 MED ORDER — STERILE WATER FOR IRRIGATION IR SOLN
Status: DC | PRN
  Administered 2021-01-02: 200 mL

## 2021-01-02 MED ORDER — LACTATED RINGERS IV SOLN: INTRAVENOUS | Status: DC

## 2021-01-02 MED ORDER — FENTANYL CITRATE (PF) 100 MCG/2ML IJ SOLN: INTRAMUSCULAR | Status: DC | PRN

## 2021-01-02 MED ORDER — LIDOCAINE HCL (CARDIAC) PF 50 MG/5ML IV SOSY
PREFILLED_SYRINGE | INTRAVENOUS | Status: DC | PRN
  Administered 2021-01-02: 50 mg via INTRAVENOUS

## 2021-01-02 MED ORDER — PROPOFOL 500 MG/50ML IV EMUL
INTRAVENOUS | Status: DC | PRN
  Administered 2021-01-02: 150 ug/kg/min via INTRAVENOUS

## 2021-01-02 NOTE — Discharge Instructions (Signed)
Colonoscopy Discharge Instructions  Read the instructions outlined below and refer to this sheet in the next few weeks. These discharge instructions provide you with general information on caring for yourself after you leave the hospital. Your doctor may also give you specific instructions. While your treatment has been planned according to the most current medical practices available, unavoidable complications occasionally occur. If you have any problems or questions after discharge, call Dr. Gala Romney at [phone]. ACTIVITY  You may resume your regular activity, but move at a slower pace for the next 24 hours.   Take frequent rest periods for the next 24 hours.   Walking will help get rid of the air and reduce the bloated feeling in your belly (abdomen).   No driving for 24 hours (because of the medicine (anesthesia) used during the test).    Do not sign any important legal documents or operate any machinery for 24 hours (because of the anesthesia used during the test).  NUTRITION  Drink plenty of fluids.   You may resume your normal diet as instructed by your doctor.   Begin with a light meal and progress to your normal diet. Heavy or fried foods are harder to digest and may make you feel sick to your stomach (nauseated).   Avoid alcoholic beverages for 24 hours or as instructed.  MEDICATIONS  You may resume your normal medications unless your doctor tells you otherwise.  WHAT YOU CAN EXPECT TODAY  Some feelings of bloating in the abdomen.   Passage of more gas than usual.   Spotting of blood in your stool or on the toilet paper.  IF YOU HAD POLYPS REMOVED DURING THE COLONOSCOPY:  No aspirin products for 7 days or as instructed.   No alcohol for 7 days or as instructed.   Eat a soft diet for the next 24 hours.  FINDING OUT THE RESULTS OF YOUR TEST Not all test results are available during your visit. If your test results are not back during the visit, make an appointment  with your caregiver to find out the results. Do not assume everything is normal if you have not heard from your caregiver or the medical facility. It is important for you to follow up on all of your test results.  SEEK IMMEDIATE MEDICAL ATTENTION IF:  You have more than a spotting of blood in your stool.   Your belly is swollen (abdominal distention).   You are nauseated or vomiting.   You have a temperature over 101.   You have abdominal pain or discomfort that is severe or gets worse throughout the day.   EGD Discharge instructions Please read the instructions outlined below and refer to this sheet in the next few weeks. These discharge instructions provide you with general information on caring for yourself after you leave the hospital. Your doctor may also give you specific instructions. While your treatment has been planned according to the most current medical practices available, unavoidable complications occasionally occur. If you have any problems or questions after discharge, please call your doctor. ACTIVITY  You may resume your regular activity but move at a slower pace for the next 24 hours.   Take frequent rest periods for the next 24 hours.   Walking will help expel (get rid of) the air and reduce the bloated feeling in your abdomen.   No driving for 24 hours (because of the anesthesia (medicine) used during the test).   You may shower.   Do not sign any  important legal documents or operate any machinery for 24 hours (because of the anesthesia used during the test).  NUTRITION  Drink plenty of fluids.   You may resume your normal diet.   Begin with a light meal and progress to your normal diet.   Avoid alcoholic beverages for 24 hours or as instructed by your caregiver.  MEDICATIONS  You may resume your normal medications unless your caregiver tells you otherwise.  WHAT YOU CAN EXPECT TODAY  You may experience abdominal discomfort such as a feeling of  fullness or gas pains.  FOLLOW-UP  Your doctor will discuss the results of your test with you.  SEEK IMMEDIATE MEDICAL ATTENTION IF ANY OF THE FOLLOWING OCCUR:  Excessive nausea (feeling sick to your stomach) and/or vomiting.   Severe abdominal pain and distention (swelling).   Trouble swallowing.   Temperature over 101 F (37.8 C).   Rectal bleeding or vomiting of blood.   Your colonoscopy was normal today; recommend 1 more colonoscopy in 5 years depending on overall health  No Varicose veins found today in your esophagus.  Keep upcoming office visit with [place]  At patient request, I called Parrish Bonn at [phone] and reviewed results

## 2021-01-02 NOTE — Op Note (Signed)
[facility] Patient Name: Eric Dougherty Procedure Date: 01/02/2021 9:11 AM MRN: [id number] Date of Birth: [birth year] Attending MD: Gennette Pac , MD CSN: [id number] Age: 70 Admit Type: Outpatient Procedure:                Colonoscopy Indications:              Screening in patient at increased risk: Family                            history of 1st-degree relative with colorectal                            cancer Providers:                Gennette Pac, MD, Sheran Fava,                            Pandora Leiter, Technician Referring MD:              Medicines:                Propofol per Anesthesia Complications:            No immediate complications. Estimated Blood Loss:     Estimated blood loss: none. Procedure:                Pre-Anesthesia Assessment:                           - Prior to the procedure, a History and Physical                            was performed, and patient medications and                            allergies were reviewed. The patient's tolerance of                            previous anesthesia was also reviewed. The risks                            and benefits of the procedure and the sedation                            options and risks were discussed with the patient.                            All questions were answered, and informed consent                            was obtained. Prior Anticoagulants: The patient has                            taken no previous anticoagulant or antiplatelet                            agents. ASA Grade Assessment: III -  A patient with                            severe systemic disease. After reviewing the risks                            and benefits, the patient was deemed in                            satisfactory condition to undergo the procedure.                           After obtaining informed consent, the colonoscope                            was passed under direct vision. Throughout  the                            procedure, the patient's blood pressure, pulse, and                            oxygen saturations were monitored continuously. The                            CF-[id number] ([id number]) scope was introduced through                            the anus and advanced to the the cecum, identified                            by appendiceal orifice and ileocecal valve. The                            colonoscopy was performed without difficulty. The                            patient tolerated the procedure well. The quality                            of the bowel preparation was adequate. Scope In: 9:14:49 AM Scope Out: 9:30:28 AM Scope Withdrawal Time: 0 hours 9 minutes 13 seconds  Total Procedure Duration: 0 hours 15 minutes 39 seconds  Findings:      The perianal and digital rectal examinations were normal.      The colon (entire examined portion) appeared normal.      The retroflexed view of the distal rectum and anal verge was normal and       showed no anal or rectal abnormalities. Impression:               - The entire examined colon is normal.                           - The distal rectum and anal verge are normal on  retroflexion view.                           - No specimens collected. Moderate Sedation:      Moderate (conscious) sedation was personally administered by an       anesthesia professional. The following parameters were monitored: oxygen       saturation, heart rate, blood pressure, respiratory rate, EKG, adequacy       of pulmonary ventilation, and response to care. Recommendation:           - Patient has a contact number available for                            emergencies. The signs and symptoms of potential                            delayed complications were discussed with the                            patient. Return to normal activities tomorrow.                            Written discharge instructions were  provided to the                            patient.                           - Advance diet as tolerated.                           - Repeat colonoscopy in 5 years for surveillance.                           - Return to GI office in 5 months. Procedure Code(s):        --- Professional ---                           [id number], Colonoscopy, flexible; diagnostic, including                            collection of specimen(s) by brushing or washing,                            when performed (separate procedure) Diagnosis Code(s):        --- Professional ---                           Z80.0, Family history of malignant neoplasm of                            digestive organs CPT copyright 2019 American Medical Association. All rights reserved. The codes documented in this report are preliminary and upon coder review may  be revised to meet current compliance requirements. [name]. [name], MD Gennette Pac, MD 01/02/2021 9:36:51 AM This report has been signed electronically. Number  of Addenda: 0

## 2021-01-02 NOTE — Anesthesia Postprocedure Evaluation (Signed)
Anesthesia Post Note  Patient: Eric Dougherty  Procedure(s) Performed: COLONOSCOPY WITH PROPOFOL (N/A ) ESOPHAGOGASTRODUODENOSCOPY (EGD) WITH PROPOFOL (N/A )  Patient location during evaluation: Endoscopy Anesthesia Type: General Level of consciousness: awake and alert and patient cooperative Pain management: satisfactory to patient Vital Signs Assessment: post-procedure vital signs reviewed and stable Respiratory status: spontaneous breathing Cardiovascular status: stable Postop Assessment: no apparent nausea or vomiting Anesthetic complications: no   No complications documented.   Last Vitals:  Vitals:   01/02/21 0937 01/02/21 0940  BP: (!) 96/48 108/71  Pulse: 73 78  Resp: 18 15  Temp: 36.4 C   SpO2: 95% 95%    Last Pain:  Vitals:   01/02/21 0937  TempSrc: Oral  PainSc: 0-No pain                 Johnchristopher Sarvis

## 2021-01-02 NOTE — Anesthesia Procedure Notes (Signed)
Date/Time: 01/02/2021 8:52 AM Performed by: Franco Nones, CRNA Pre-anesthesia Checklist: Patient identified, Emergency Drugs available, Suction available, Timeout performed and Patient being monitored Patient Re-evaluated:Patient Re-evaluated prior to induction Oxygen Delivery Method: Non-rebreather mask

## 2021-01-02 NOTE — Anesthesia Preprocedure Evaluation (Addendum)
Anesthesia Evaluation  Patient identified by MRN, date of birth, ID band Patient awake    Reviewed: Allergy & Precautions, NPO status , Patient's Chart, lab work & pertinent test results  History of Anesthesia Complications (+) PONV and history of anesthetic complications  Airway Mallampati: III  TM Distance: >3 FB Neck ROM: Full    Dental  (+) Dental Advisory Given, Teeth Intact   Pulmonary sleep apnea and Continuous Positive Airway Pressure Ventilation , former smoker,    Pulmonary exam normal breath sounds clear to auscultation       Cardiovascular Exercise Tolerance: Good hypertension, Pt. on medications Normal cardiovascular exam Rhythm:Regular Rate:Normal     Neuro/Psych  Neuromuscular disease negative psych ROS   GI/Hepatic PUD, (+)     substance abuse  alcohol use,   Endo/Other  diabetes, Well Controlled, Type 2  Renal/GU negative Renal ROS  negative genitourinary   Musculoskeletal negative musculoskeletal ROS (+)   Abdominal   Peds  Hematology negative hematology ROS (+)   Anesthesia Other Findings   Reproductive/Obstetrics negative OB ROS                            Anesthesia Physical Anesthesia Plan  ASA: III  Anesthesia Plan: General   Post-op Pain Management:    Induction: Intravenous  PONV Risk Score and Plan: Propofol infusion  Airway Management Planned: Nasal Cannula, Natural Airway and Simple Face Mask  Additional Equipment:   Intra-op Plan:   Post-operative Plan:   Informed Consent: I have reviewed the patients History and Physical, chart, labs and discussed the procedure including the risks, benefits and alternatives for the proposed anesthesia with the patient or authorized representative who has indicated his/her understanding and acceptance.     Dental advisory given  Plan Discussed with: CRNA and Surgeon  Anesthesia Plan Comments:          Anesthesia Quick Evaluation

## 2021-01-02 NOTE — Transfer of Care (Signed)
Immediate Anesthesia Transfer of Care Note  Patient: Eric Dougherty  Procedure(s) Performed: COLONOSCOPY WITH PROPOFOL (N/A ) ESOPHAGOGASTRODUODENOSCOPY (EGD) WITH PROPOFOL (N/A )  Patient Location: Endoscopy Unit  Anesthesia Type:General  Level of Consciousness: awake and patient cooperative  Airway & Oxygen Therapy: Patient Spontanous Breathing  Post-op Assessment: Report given to RN and Post -op Vital signs reviewed and stable  Post vital signs: Reviewed and stable  Last Vitals:  Vitals Value Taken Time  BP    Temp    Pulse    Resp    SpO2    SEE VITAL SIGN FLOW SHEET  Last Pain:  Vitals:   01/02/21 0854  TempSrc:   PainSc: 0-No pain      Patients Stated Pain Goal: 5 (01/02/21 0757)  Complications: No complications documented.

## 2021-01-02 NOTE — H&P (Addendum)
@LOGO @   Primary Care Physician:  , PA-C Primary Gastroenterologist:  Dr. Avis Epley  Pre-Procedure History & Physical: HPI:  Eric Dougherty is a 70 y.o. male here for for surveillance EGD and high risk screening colonoscopy.  History of cirrhosis and positive family history of colon cancer  Past Medical History:  Diagnosis Date  . Cirrhosis (HCC)   . Diabetes mellitus    resolution of symptoms  . Hypertension    no longer on meds  . PONV (postoperative nausea and vomiting)   . Sciatica     Past Surgical History:  Procedure Laterality Date  . BIOPSY  11/09/2018   Procedure: BIOPSY;  Surgeon: 01/08/2019, MD;  [facility]: [facility];  Service: Endoscopy;;  gastric   . COLONOSCOPY  11/20/2005   RMR: Minimal hemorrhoids, otherwise normal  . COLONOSCOPY N/A 03/25/2016   three semi-pedunculated polyps 4-6 mm in size, multiple medium-mouthed sigmoid diverticula (hyperplastic).   03/27/2016 ESOPHAGOGASTRODUODENOSCOPY N/A 11/09/2018   11/09/2018 with erosive reflux esophagitis, erythematous mucosa in the stomach status post biopsy found to be mild chronic gastritis.  01/08/2019 HEMORRHOID SURGERY    . HERNIA REPAIR    . LITHOTRIPSY    . TONSILLECTOMY    . TOOTH EXTRACTION      Prior to Admission medications   Medication Sig Start Date End Date Taking? Authorizing Provider  aspirin EC 81 MG tablet Take 81 mg by mouth daily.   Yes [provider]  atorvastatin (LIPITOR) 40 MG tablet TAKE 1 TABLET ONCE DAILY. Patient taking differently: Take 40 mg by mouth daily. 01/13/19  Yes Branch3/15/20, MD  Cholecalciferol (VITAMIN D3) 50 MCG (2000 UT) TABS Take 2,000 Units by mouth daily.   Yes [provider]  famotidine (PEPCID) 20 MG tablet Take 20 mg by mouth daily.   Yes [provider]  folic acid (FOLVITE) 1 MG tablet Take 1,000 mcg by mouth daily.   Yes [provider]  Ibuprofen-Acetaminophen (ADVIL DUAL ACTION) 125-250 MG TABS Take 1 tablet by  mouth daily as needed (Pain).   Yes [provider]  latanoprost (XALATAN) 0.005 % ophthalmic solution Place 1 drop into both eyes at bedtime. 12/05/14  Yes [provider]  lisinopril (PRINIVIL,ZESTRIL) 2.5 MG tablet Take 2.5 mg by mouth daily.   Yes [provider]  metFORMIN (GLUCOPHAGE) 1000 MG tablet Take 1,000 mg by mouth 2 (two) times daily. 08/07/19  Yes [provider]  naproxen sodium (ALEVE) 220 MG tablet Take 440 mg by mouth daily as needed (pain).   Yes [provider]  tadalafil (CIALIS) 5 MG tablet Take 5 mg by mouth daily.  05/19/16  Yes [provider]  tamsulosin (FLOMAX) 0.4 MG CAPS capsule Take 0.4 mg by mouth daily.  04/16/13  Yes [provider]  thiamine (VITAMIN B-1) 100 MG tablet Take 100 mg by mouth daily.   Yes [provider]  vitamin C (ASCORBIC ACID) 500 MG tablet Take 500 mg by mouth daily.   Yes [provider]  zinc gluconate 50 MG tablet Take 50 mg by mouth daily.   Yes [provider]  mupirocin ointment (BACTROBAN) 2 % Apply 1 application topically daily as needed. 08/06/20   [provider]    Allergies as of 11/27/2020  . (No Known Allergies)    Family History  Problem Relation Age of Onset  . Leukemia Father   . Hypothyroidism Brother   . Glaucoma Brother   .  Rectal cancer Brother 90       s/p surgical resection; dx 2 years ago  . Osteoarthritis Other   . Prostate cancer Paternal Grandfather   . Cirrhosis Cousin     Social History   Socioeconomic History  . Marital status: Married    Spouse name: Not on file  . Number of children: Not on file  . Years of education: Not on file  . Highest education level: Not on file  Occupational History  . Not on file  Tobacco Use  . Smoking status: Former Smoker    Quit date: 11/02/1998    Years since quitting: 22.1  . Smokeless tobacco: Former Neurosurgeon    Types: Chew    Quit date: 11/02/1978  Vaping Use  .  Vaping Use: Never used  Substance and Sexual Activity  . Alcohol use: Not Currently    Alcohol/week: 7.0 standard drinks    Types: 7 Standard drinks or equivalent per week    Comment: 2-3 at a time; 2-3 times a week; previously: none in 2019 but did have 1 drink in June on vacation.  . Drug use: No  . Sexual activity: Not on file  Other Topics Concern  . Not on file  Social History Narrative  . Not on file   Social Determinants of Health   Financial Resource Strain: Not on file  Food Insecurity: Not on file  Transportation Needs: Not on file  Physical Activity: Not on file  Stress: Not on file  Social Connections: Not on file  Intimate Partner Violence: Not on file    Review of Systems: See HPI, otherwise negative ROS  Physical Exam: BP 135/67   Pulse 87   Temp 98.6 F (37 C) (Oral)   Resp 20   Ht 5\' 9"  (1.753 m)   Wt 106.6 kg   SpO2 95%   BMI 34.70 kg/m  General:   Alert,  Well-developed, well-nourished, pleasant and cooperative in NAD Neck:  Supple; no masses or thyromegaly. No significant cervical adenopathy. Lungs:  Clear throughout to auscultation.   No wheezes, crackles, or rhonchi. No acute distress. Heart:  Regular rate and rhythm; no murmurs, clicks, rubs,  or gallops. Abdomen: Non-distended, normal bowel sounds.  Soft and nontender without appreciable mass or hepatosplenomegaly.  Pulses:  Normal pulses noted. Extremities:  Without clubbing or edema.  Impression/Plan: 70 year old gentleman with cirrhosis here for surveillance EGD and high risk screening colonoscopy.  The risks, benefits, limitations, imponderables and alternatives regarding both EGD and colonoscopy have been reviewed with the patient. Questions have been answered. All parties agreeable.      Notice: This dictation was prepared with Dragon dictation along with smaller phrase technology. Any transcriptional errors that result from this process are unintentional and may not be corrected upon  review.

## 2021-01-02 NOTE — Op Note (Signed)
[facility] Patient Name: Eric Dougherty Procedure Date: 01/02/2021 8:37 AM MRN: [id number] Date of Birth: Jul 06, 1951 Attending MD: Gennette Pac , MD CSN: [id number] Age: 70 Admit Type: Outpatient Procedure:                Upper GI endoscopy Indications:              Surveillance procedure, Variceal screening (no                            known varices or prior bleeding) Providers:                Gennette Pac, MD, Sheran Fava,                            Pandora Leiter, Technician Referring MD:              Medicines:                Propofol per Anesthesia Complications:            No immediate complications. Estimated Blood Loss:     Estimated blood loss: none. Procedure:                Pre-Anesthesia Assessment:                           - Prior to the procedure, a History and Physical                            was performed, and patient medications and                            allergies were reviewed. The patient's tolerance of                            previous anesthesia was also reviewed. The risks                            and benefits of the procedure and the sedation                            options and risks were discussed with the patient.                            All questions were answered, and informed consent                            was obtained. Prior Anticoagulants: The patient has                            taken no previous anticoagulant or antiplatelet                            agents. ASA Grade Assessment: III - A patient with  severe systemic disease. After reviewing the risks                            and benefits, the patient was deemed in                            satisfactory condition to undergo the procedure.                           After obtaining informed consent, the endoscope was                            passed under direct vision. Throughout the                            procedure,  the patient's blood pressure, pulse, and                            oxygen saturations were monitored continuously. The                            [id number]-[id number] ([id number]) was introduced through the                            mouth, and advanced to the second part of duodenum.                            The upper GI endoscopy was accomplished without                            difficulty. The patient tolerated the procedure                            well. Scope In: 9:00:56 AM Scope Out: 9:05:02 AM Total Procedure Duration: 0 hours 4 minutes 6 seconds  Findings:      The examined esophagus was normal.      Mild congestion friability of the gastric body consistent with mild       portal gastropathy. No esophageal varices, ulcer or infiltrating process       seen.      The duodenal bulb and second portion of the duodenum were normal. Impression:               - Normal esophagus.                           - Portal hypertensive gastropathy (mild).                           - Normal duodenal bulb and second portion of the                            duodenum.                           - No specimens collected. Moderate Sedation:  Moderate (conscious) sedation was personally administered by an       anesthesia professional. The following parameters were monitored: oxygen       saturation, heart rate, blood pressure, respiratory rate, EKG, adequacy       of pulmonary ventilation, and response to care. Recommendation:           - Patient has a contact number available for                            emergencies. The signs and symptoms of potential                            delayed complications were discussed with the                            patient. Return to normal activities tomorrow.                            Written discharge instructions were provided to the                            patient.                           - Advance diet as tolerated.                           - Continue  present medications.                           - Await pathology results.                           - - Repeat upper endoscopy based on future platelet                            count, kilopascal values. Office visit with [place] in 6                            months if not already scheduled. See colonoscopy                            report. Procedure Code(s):        --- Professional ---                           [phone], Esophagogastroduodenoscopy, flexible,                            transoral; diagnostic, including collection of                            specimen(s) by brushing or washing, when performed                            (separate procedure) Diagnosis Code(s):        ---  Professional ---                           K76.6, Portal hypertension                           K31.89, Other diseases of stomach and duodenum                           Z13.810, Encounter for screening for upper                            gastrointestinal disorder CPT copyright 2019 American Medical Association. All rights reserved. The codes documented in this report are preliminary and upon coder review may  be revised to meet current compliance requirements. Eric Dougherty. Eric Downie, MD Gennette Pac, MD 01/02/2021 9:12:28 AM This report has been signed electronically. Number of Addenda: 0

## 2021-01-08 ENCOUNTER — Encounter (HOSPITAL_COMMUNITY): Payer: Self-pay | Admitting: Internal Medicine

## 2021-04-17 DIAGNOSIS — M9902 Segmental and somatic dysfunction of thoracic region: Secondary | ICD-10-CM | POA: Diagnosis not present

## 2021-04-17 DIAGNOSIS — M9903 Segmental and somatic dysfunction of lumbar region: Secondary | ICD-10-CM | POA: Diagnosis not present

## 2021-04-17 DIAGNOSIS — M9901 Segmental and somatic dysfunction of cervical region: Secondary | ICD-10-CM | POA: Diagnosis not present

## 2021-04-17 DIAGNOSIS — M546 Pain in thoracic spine: Secondary | ICD-10-CM | POA: Diagnosis not present

## 2021-04-17 DIAGNOSIS — M542 Cervicalgia: Secondary | ICD-10-CM | POA: Diagnosis not present

## 2021-04-17 DIAGNOSIS — M5441 Lumbago with sciatica, right side: Secondary | ICD-10-CM | POA: Diagnosis not present

## 2021-04-21 DIAGNOSIS — M9902 Segmental and somatic dysfunction of thoracic region: Secondary | ICD-10-CM | POA: Diagnosis not present

## 2021-04-21 DIAGNOSIS — M542 Cervicalgia: Secondary | ICD-10-CM | POA: Diagnosis not present

## 2021-04-21 DIAGNOSIS — M5441 Lumbago with sciatica, right side: Secondary | ICD-10-CM | POA: Diagnosis not present

## 2021-04-21 DIAGNOSIS — M546 Pain in thoracic spine: Secondary | ICD-10-CM | POA: Diagnosis not present

## 2021-04-21 DIAGNOSIS — M9901 Segmental and somatic dysfunction of cervical region: Secondary | ICD-10-CM | POA: Diagnosis not present

## 2021-04-21 DIAGNOSIS — M9903 Segmental and somatic dysfunction of lumbar region: Secondary | ICD-10-CM | POA: Diagnosis not present

## 2021-04-23 DIAGNOSIS — M5441 Lumbago with sciatica, right side: Secondary | ICD-10-CM | POA: Diagnosis not present

## 2021-04-23 DIAGNOSIS — M9902 Segmental and somatic dysfunction of thoracic region: Secondary | ICD-10-CM | POA: Diagnosis not present

## 2021-04-23 DIAGNOSIS — M9903 Segmental and somatic dysfunction of lumbar region: Secondary | ICD-10-CM | POA: Diagnosis not present

## 2021-04-23 DIAGNOSIS — M9901 Segmental and somatic dysfunction of cervical region: Secondary | ICD-10-CM | POA: Diagnosis not present

## 2021-04-23 DIAGNOSIS — M546 Pain in thoracic spine: Secondary | ICD-10-CM | POA: Diagnosis not present

## 2021-04-23 DIAGNOSIS — M542 Cervicalgia: Secondary | ICD-10-CM | POA: Diagnosis not present

## 2021-04-28 DIAGNOSIS — M9902 Segmental and somatic dysfunction of thoracic region: Secondary | ICD-10-CM | POA: Diagnosis not present

## 2021-04-28 DIAGNOSIS — M542 Cervicalgia: Secondary | ICD-10-CM | POA: Diagnosis not present

## 2021-04-28 DIAGNOSIS — M9901 Segmental and somatic dysfunction of cervical region: Secondary | ICD-10-CM | POA: Diagnosis not present

## 2021-04-28 DIAGNOSIS — M546 Pain in thoracic spine: Secondary | ICD-10-CM | POA: Diagnosis not present

## 2021-04-28 DIAGNOSIS — M5441 Lumbago with sciatica, right side: Secondary | ICD-10-CM | POA: Diagnosis not present

## 2021-04-28 DIAGNOSIS — M9903 Segmental and somatic dysfunction of lumbar region: Secondary | ICD-10-CM | POA: Diagnosis not present

## 2021-04-30 DIAGNOSIS — M546 Pain in thoracic spine: Secondary | ICD-10-CM | POA: Diagnosis not present

## 2021-04-30 DIAGNOSIS — M9903 Segmental and somatic dysfunction of lumbar region: Secondary | ICD-10-CM | POA: Diagnosis not present

## 2021-04-30 DIAGNOSIS — M542 Cervicalgia: Secondary | ICD-10-CM | POA: Diagnosis not present

## 2021-04-30 DIAGNOSIS — M5441 Lumbago with sciatica, right side: Secondary | ICD-10-CM | POA: Diagnosis not present

## 2021-04-30 DIAGNOSIS — M9902 Segmental and somatic dysfunction of thoracic region: Secondary | ICD-10-CM | POA: Diagnosis not present

## 2021-04-30 DIAGNOSIS — M9901 Segmental and somatic dysfunction of cervical region: Secondary | ICD-10-CM | POA: Diagnosis not present

## 2021-05-06 DIAGNOSIS — M5441 Lumbago with sciatica, right side: Secondary | ICD-10-CM | POA: Diagnosis not present

## 2021-05-06 DIAGNOSIS — M9902 Segmental and somatic dysfunction of thoracic region: Secondary | ICD-10-CM | POA: Diagnosis not present

## 2021-05-06 DIAGNOSIS — M546 Pain in thoracic spine: Secondary | ICD-10-CM | POA: Diagnosis not present

## 2021-05-06 DIAGNOSIS — M9901 Segmental and somatic dysfunction of cervical region: Secondary | ICD-10-CM | POA: Diagnosis not present

## 2021-05-06 DIAGNOSIS — M542 Cervicalgia: Secondary | ICD-10-CM | POA: Diagnosis not present

## 2021-05-06 DIAGNOSIS — M9903 Segmental and somatic dysfunction of lumbar region: Secondary | ICD-10-CM | POA: Diagnosis not present

## 2021-05-09 DIAGNOSIS — M9903 Segmental and somatic dysfunction of lumbar region: Secondary | ICD-10-CM | POA: Diagnosis not present

## 2021-05-09 DIAGNOSIS — M542 Cervicalgia: Secondary | ICD-10-CM | POA: Diagnosis not present

## 2021-05-09 DIAGNOSIS — M546 Pain in thoracic spine: Secondary | ICD-10-CM | POA: Diagnosis not present

## 2021-05-09 DIAGNOSIS — M9902 Segmental and somatic dysfunction of thoracic region: Secondary | ICD-10-CM | POA: Diagnosis not present

## 2021-05-09 DIAGNOSIS — M9901 Segmental and somatic dysfunction of cervical region: Secondary | ICD-10-CM | POA: Diagnosis not present

## 2021-05-09 DIAGNOSIS — M5441 Lumbago with sciatica, right side: Secondary | ICD-10-CM | POA: Diagnosis not present

## 2021-05-13 DIAGNOSIS — M5441 Lumbago with sciatica, right side: Secondary | ICD-10-CM | POA: Diagnosis not present

## 2021-05-13 DIAGNOSIS — M546 Pain in thoracic spine: Secondary | ICD-10-CM | POA: Diagnosis not present

## 2021-05-13 DIAGNOSIS — M542 Cervicalgia: Secondary | ICD-10-CM | POA: Diagnosis not present

## 2021-05-13 DIAGNOSIS — M9903 Segmental and somatic dysfunction of lumbar region: Secondary | ICD-10-CM | POA: Diagnosis not present

## 2021-05-13 DIAGNOSIS — M9902 Segmental and somatic dysfunction of thoracic region: Secondary | ICD-10-CM | POA: Diagnosis not present

## 2021-05-13 DIAGNOSIS — M9901 Segmental and somatic dysfunction of cervical region: Secondary | ICD-10-CM | POA: Diagnosis not present

## 2021-05-16 DIAGNOSIS — M546 Pain in thoracic spine: Secondary | ICD-10-CM | POA: Diagnosis not present

## 2021-05-16 DIAGNOSIS — M9902 Segmental and somatic dysfunction of thoracic region: Secondary | ICD-10-CM | POA: Diagnosis not present

## 2021-05-16 DIAGNOSIS — M542 Cervicalgia: Secondary | ICD-10-CM | POA: Diagnosis not present

## 2021-05-16 DIAGNOSIS — M9903 Segmental and somatic dysfunction of lumbar region: Secondary | ICD-10-CM | POA: Diagnosis not present

## 2021-05-16 DIAGNOSIS — M9901 Segmental and somatic dysfunction of cervical region: Secondary | ICD-10-CM | POA: Diagnosis not present

## 2021-05-16 DIAGNOSIS — M5441 Lumbago with sciatica, right side: Secondary | ICD-10-CM | POA: Diagnosis not present

## 2021-05-22 DIAGNOSIS — M546 Pain in thoracic spine: Secondary | ICD-10-CM | POA: Diagnosis not present

## 2021-05-22 DIAGNOSIS — M9901 Segmental and somatic dysfunction of cervical region: Secondary | ICD-10-CM | POA: Diagnosis not present

## 2021-05-22 DIAGNOSIS — M9902 Segmental and somatic dysfunction of thoracic region: Secondary | ICD-10-CM | POA: Diagnosis not present

## 2021-05-22 DIAGNOSIS — M5441 Lumbago with sciatica, right side: Secondary | ICD-10-CM | POA: Diagnosis not present

## 2021-05-22 DIAGNOSIS — M542 Cervicalgia: Secondary | ICD-10-CM | POA: Diagnosis not present

## 2021-05-22 DIAGNOSIS — M9903 Segmental and somatic dysfunction of lumbar region: Secondary | ICD-10-CM | POA: Diagnosis not present

## 2021-05-27 ENCOUNTER — Ambulatory Visit: Payer: Medicare PPO | Admitting: Gastroenterology

## 2021-05-28 DIAGNOSIS — M546 Pain in thoracic spine: Secondary | ICD-10-CM | POA: Diagnosis not present

## 2021-05-28 DIAGNOSIS — M5441 Lumbago with sciatica, right side: Secondary | ICD-10-CM | POA: Diagnosis not present

## 2021-05-28 DIAGNOSIS — M9903 Segmental and somatic dysfunction of lumbar region: Secondary | ICD-10-CM | POA: Diagnosis not present

## 2021-05-28 DIAGNOSIS — M9901 Segmental and somatic dysfunction of cervical region: Secondary | ICD-10-CM | POA: Diagnosis not present

## 2021-05-28 DIAGNOSIS — M9902 Segmental and somatic dysfunction of thoracic region: Secondary | ICD-10-CM | POA: Diagnosis not present

## 2021-05-28 DIAGNOSIS — M542 Cervicalgia: Secondary | ICD-10-CM | POA: Diagnosis not present

## 2021-05-29 ENCOUNTER — Telehealth: Payer: Self-pay | Admitting: Internal Medicine

## 2021-05-29 DIAGNOSIS — K7460 Unspecified cirrhosis of liver: Secondary | ICD-10-CM

## 2021-05-29 NOTE — Telephone Encounter (Signed)
Recall mailed 

## 2021-05-29 NOTE — Telephone Encounter (Signed)
RECALL FOR ULTRASOUND 

## 2021-06-05 NOTE — Addendum Note (Signed)
Addended by: Armstead Peaks on: 06/05/2021 01:47 PM   Modules accepted: Orders

## 2021-06-05 NOTE — Telephone Encounter (Signed)
Patient called in to schedule [place]  [place] scheduled for 8/12 arrival 8:15am, npo midnight.  Pt aware of appt details. Nothing further needed

## 2021-06-11 DIAGNOSIS — M5441 Lumbago with sciatica, right side: Secondary | ICD-10-CM | POA: Diagnosis not present

## 2021-06-11 DIAGNOSIS — M9901 Segmental and somatic dysfunction of cervical region: Secondary | ICD-10-CM | POA: Diagnosis not present

## 2021-06-11 DIAGNOSIS — M546 Pain in thoracic spine: Secondary | ICD-10-CM | POA: Diagnosis not present

## 2021-06-11 DIAGNOSIS — M542 Cervicalgia: Secondary | ICD-10-CM | POA: Diagnosis not present

## 2021-06-11 DIAGNOSIS — M9902 Segmental and somatic dysfunction of thoracic region: Secondary | ICD-10-CM | POA: Diagnosis not present

## 2021-06-11 DIAGNOSIS — M9903 Segmental and somatic dysfunction of lumbar region: Secondary | ICD-10-CM | POA: Diagnosis not present

## 2021-06-13 ENCOUNTER — Ambulatory Visit (HOSPITAL_COMMUNITY)
Admission: RE | Admit: 2021-06-13 | Discharge: 2021-06-13 | Disposition: A | Discharge status: Home / Self Care | Payer: Medicare PPO | Source: Ambulatory Visit | Attending: Gastroenterology | Admitting: Gastroenterology

## 2021-06-13 ENCOUNTER — Other Ambulatory Visit: Payer: Self-pay

## 2021-06-13 DIAGNOSIS — K7460 Unspecified cirrhosis of liver: Secondary | ICD-10-CM | POA: Diagnosis not present

## 2021-06-13 DIAGNOSIS — K8020 Calculus of gallbladder without cholecystitis without obstruction: Secondary | ICD-10-CM | POA: Diagnosis not present

## 2021-06-25 DIAGNOSIS — M9902 Segmental and somatic dysfunction of thoracic region: Secondary | ICD-10-CM | POA: Diagnosis not present

## 2021-06-25 DIAGNOSIS — M9903 Segmental and somatic dysfunction of lumbar region: Secondary | ICD-10-CM | POA: Diagnosis not present

## 2021-06-25 DIAGNOSIS — M5441 Lumbago with sciatica, right side: Secondary | ICD-10-CM | POA: Diagnosis not present

## 2021-06-25 DIAGNOSIS — M542 Cervicalgia: Secondary | ICD-10-CM | POA: Diagnosis not present

## 2021-06-25 DIAGNOSIS — M9901 Segmental and somatic dysfunction of cervical region: Secondary | ICD-10-CM | POA: Diagnosis not present

## 2021-06-25 DIAGNOSIS — M546 Pain in thoracic spine: Secondary | ICD-10-CM | POA: Diagnosis not present

## 2021-07-23 DIAGNOSIS — M9901 Segmental and somatic dysfunction of cervical region: Secondary | ICD-10-CM | POA: Diagnosis not present

## 2021-07-23 DIAGNOSIS — M542 Cervicalgia: Secondary | ICD-10-CM | POA: Diagnosis not present

## 2021-07-23 DIAGNOSIS — M9903 Segmental and somatic dysfunction of lumbar region: Secondary | ICD-10-CM | POA: Diagnosis not present

## 2021-07-23 DIAGNOSIS — M546 Pain in thoracic spine: Secondary | ICD-10-CM | POA: Diagnosis not present

## 2021-07-23 DIAGNOSIS — M5441 Lumbago with sciatica, right side: Secondary | ICD-10-CM | POA: Diagnosis not present

## 2021-07-23 DIAGNOSIS — M9902 Segmental and somatic dysfunction of thoracic region: Secondary | ICD-10-CM | POA: Diagnosis not present

## 2021-08-20 DIAGNOSIS — M542 Cervicalgia: Secondary | ICD-10-CM | POA: Diagnosis not present

## 2021-08-20 DIAGNOSIS — M5441 Lumbago with sciatica, right side: Secondary | ICD-10-CM | POA: Diagnosis not present

## 2021-08-20 DIAGNOSIS — M9903 Segmental and somatic dysfunction of lumbar region: Secondary | ICD-10-CM | POA: Diagnosis not present

## 2021-08-20 DIAGNOSIS — M9902 Segmental and somatic dysfunction of thoracic region: Secondary | ICD-10-CM | POA: Diagnosis not present

## 2021-08-20 DIAGNOSIS — M9901 Segmental and somatic dysfunction of cervical region: Secondary | ICD-10-CM | POA: Diagnosis not present

## 2021-08-20 DIAGNOSIS — M546 Pain in thoracic spine: Secondary | ICD-10-CM | POA: Diagnosis not present

## 2021-09-05 DIAGNOSIS — I10 Essential (primary) hypertension: Secondary | ICD-10-CM | POA: Diagnosis not present

## 2021-09-05 DIAGNOSIS — E6609 Other obesity due to excess calories: Secondary | ICD-10-CM | POA: Diagnosis not present

## 2021-09-05 DIAGNOSIS — E118 Type 2 diabetes mellitus with unspecified complications: Secondary | ICD-10-CM | POA: Diagnosis not present

## 2021-09-05 DIAGNOSIS — E7849 Other hyperlipidemia: Secondary | ICD-10-CM | POA: Diagnosis not present

## 2021-09-05 DIAGNOSIS — E1165 Type 2 diabetes mellitus with hyperglycemia: Secondary | ICD-10-CM | POA: Diagnosis not present

## 2021-09-05 DIAGNOSIS — Z1331 Encounter for screening for depression: Secondary | ICD-10-CM | POA: Diagnosis not present

## 2021-09-05 DIAGNOSIS — Z6836 Body mass index (BMI) 36.0-36.9, adult: Secondary | ICD-10-CM | POA: Diagnosis not present

## 2021-09-05 DIAGNOSIS — E782 Mixed hyperlipidemia: Secondary | ICD-10-CM | POA: Diagnosis not present

## 2021-09-05 DIAGNOSIS — K7460 Unspecified cirrhosis of liver: Secondary | ICD-10-CM | POA: Diagnosis not present

## 2021-09-05 DIAGNOSIS — E291 Testicular hypofunction: Secondary | ICD-10-CM | POA: Diagnosis not present

## 2021-09-05 DIAGNOSIS — N138 Other obstructive and reflux uropathy: Secondary | ICD-10-CM | POA: Diagnosis not present

## 2021-09-08 NOTE — Progress Notes (Addendum)
Referring Provider: Jake Samples, PA* Primary Care Physician:  Jake Samples, PA-C Primary GI: Dr. Gala Romney  Chief Complaint  Patient presents with   Follow-up    HPI:   Eric Dougherty is a 70 y.o. male presenting today with a history of cirrhosis likely due to ETOH, fatty liver. Hep B serologies negative, Hep C negative, completed Hep B vaccinations. Recent colonoscopy normal in March 2022. 5 year surveillance recommended. 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 US abdomen due again in Feb 2023   [place] stool scale #6-7 about once per week. Rest of week around [facility] stool scale 4-5. Dairy will flare him up. Will use lactaid prn. Unsure if any trigger foods. No abdominal pain. No N/V. No jaundice or pruritis. Denies any rectal bleeding. No abdominal distension or lower extremity edema.      Past Medical History:  Diagnosis Date   Cirrhosis ([place])    Diabetes mellitus    resolution of symptoms   Hypertension    no longer on meds   PONV (postoperative nausea and vomiting)    Sciatica     Past Surgical History:  Procedure Laterality Date   BIOPSY  11/09/2018   Procedure: BIOPSY;  Surgeon: Daneil Dolin, MD;  [facility]: [facility];  Service: Endoscopy;;  gastric    COLONOSCOPY  11/20/2005   RMR: Minimal hemorrhoids, otherwise normal   COLONOSCOPY N/A 03/25/2016   three semi-pedunculated polyps 4-6 mm in size, multiple medium-mouthed sigmoid diverticula (hyperplastic).    COLONOSCOPY WITH PROPOFOL N/A 01/02/2021   normal   ESOPHAGOGASTRODUODENOSCOPY N/A 11/09/2018   11/09/2018 with erosive reflux esophagitis, erythematous mucosa in the stomach status post biopsy found to be mild chronic gastritis.   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   HEMORRHOID SURGERY     HERNIA REPAIR     LITHOTRIPSY      TONSILLECTOMY     TOOTH EXTRACTION      Current Outpatient Medications  Medication Sig Dispense Refill   aspirin EC 81 MG tablet Take 81 mg by mouth daily.     atorvastatin (LIPITOR) 40 MG tablet TAKE 1 TABLET ONCE DAILY. (Patient taking differently: Take 40 mg by mouth daily.) 90 tablet 3   Cholecalciferol (VITAMIN D3) 50 MCG (2000 UT) TABS Take 2,000 Units by mouth daily.     famotidine (PEPCID) 20 MG tablet Take 20 mg by mouth daily.     folic acid (FOLVITE) 1 MG tablet Take 1,000 mcg by mouth daily.     Ibuprofen-Acetaminophen (ADVIL DUAL ACTION) 125-250 MG TABS Take 1 tablet by mouth daily as needed (Pain).     latanoprost (XALATAN) 0.005 % ophthalmic solution Place 1 drop into both eyes at bedtime.     lisinopril (PRINIVIL,ZESTRIL) 2.5 MG tablet Take 2.5 mg by mouth daily.     metFORMIN (GLUCOPHAGE) 1000 MG tablet Take 1,000 mg by mouth 2 (two) times daily.     mupirocin ointment (BACTROBAN) 2 % Apply 1 application topically daily as needed.     naproxen sodium (ALEVE) 220 MG tablet Take 440 mg by mouth daily as needed (pain).     tadalafil (CIALIS) 5 MG tablet Take 5 mg by mouth daily.      tamsulosin (FLOMAX) 0.4 MG CAPS capsule Take 0.4 mg by mouth daily.      thiamine (VITAMIN B-1) 100 MG  tablet Take 100 mg by mouth daily.     vitamin C (ASCORBIC ACID) 500 MG tablet Take 500 mg by mouth daily.     zinc gluconate 50 MG tablet Take 50 mg by mouth daily.     No current facility-administered medications for this visit.    Allergies as of 09/09/2021   (No Known Allergies)    Family History  Problem Relation Age of Onset   Leukemia Father    Hypothyroidism Brother    Glaucoma Brother    Rectal cancer Brother 76       s/p surgical resection; dx 2 years ago   Osteoarthritis Other    Prostate cancer Paternal Grandfather    Cirrhosis Cousin     Social History   Socioeconomic History   Marital status: Married    Spouse name: Not on file   Number of children: Not on  file   Years of education: Not on file   Highest education level: Not on file  Occupational History   Not on file  Tobacco Use   Smoking status: Former   Smokeless tobacco: Former    Types: Chew    Quit date: 11/02/1978  Vaping Use   Vaping Use: Never used  Substance and Sexual Activity   Alcohol use: Not Currently    Alcohol/week: 7.0 standard drinks    Types: 7 Standard drinks or equivalent per week    Comment: 2-3 at a time; 2-3 times a week; previously: none in 2019 but did have 1 drink in June on vacation.   Drug use: No   Sexual activity: Not on file  Other Topics Concern   Not on file  Social History Narrative   Not on file   Social Determinants of Health   Financial Resource Strain: Not on file  Food Insecurity: Not on file  Transportation Needs: Not on file  Physical Activity: Not on file  Stress: Not on file  Social Connections: Not on file    Review of Systems: Gen: Denies fever, chills, anorexia. Denies fatigue, weakness, weight loss.  CV: Denies chest pain, palpitations, syncope, peripheral edema, and claudication. Resp: Denies dyspnea at rest, cough, wheezing, coughing up blood, and pleurisy. GI: see HPI Derm: Denies rash, itching, dry skin Psych: Denies depression, anxiety, memory loss, confusion. No homicidal or suicidal ideation.  Heme: Denies bruising, bleeding, and enlarged lymph nodes.  Physical Exam: BP (!) 143/71   Pulse 60   Temp (!) 96.9 F (36.1 C) (Temporal)   Ht 5' 9"  (1.753 m)   Wt 242 lb 6.4 oz (110 kg)   BMI 35.80 kg/m  General:   Alert and oriented. No distress noted. Pleasant and cooperative.  Head:  Normocephalic and atraumatic. Eyes:  Conjuctiva clear without scleral icterus. Mouth:  mask in place Abdomen:  +BS, soft, non-tender and non-distended. No rebound or guarding. No HSM or masses noted. Msk:  Symmetrical without gross deformities. Normal posture. Extremities:  Without edema. Neurologic:  Alert and  oriented x4 Psych:   Alert and cooperative. Normal mood and affect.  ASSESSMENT: Eric Dougherty is a 70 y.o. male presenting today  with a history of cirrhosis likely due to ETOH, fatty liver. Well-compensated.   Cirrhosis: due for [place] again in Feb 2023, which we will arrange. Requesting outside labs from PCP to have on file. EGD recently completed March 2022 with portal gastropathy. No varices.   Intermittent loose stool: query diet related. Does no affect quality of life. I have asked him  to keep log of this. Add Benefiber in meantime.     PLAN:  [place] Feb 2023 Obtain labs from PCP Benefiber 2 teaspoons daily Call if more frequent loose stools Return in 6 months  Annitta Needs, PhD, ANP-BC [facility]     11/05/21: Outside labs from Nov 2022 reviewed. Hgb 14, Hct 41.5, Platelets 150. BUN 17, Creatinine 0.83, Tbili 0.4, Alk Phos 68, AST 22, ALT 31.

## 2021-09-09 ENCOUNTER — Other Ambulatory Visit: Payer: Self-pay

## 2021-09-09 ENCOUNTER — Encounter: Payer: Self-pay | Admitting: Internal Medicine

## 2021-09-09 ENCOUNTER — Ambulatory Visit: Payer: Medicare PPO | Admitting: Gastroenterology

## 2021-09-09 ENCOUNTER — Encounter: Payer: Self-pay | Admitting: Gastroenterology

## 2021-09-09 VITALS — BP 143/71 | HR 60 | Temp 96.9°F | Ht 69.0 in | Wt 242.4 lb

## 2021-09-09 DIAGNOSIS — K7460 Unspecified cirrhosis of liver: Secondary | ICD-10-CM

## 2021-09-09 NOTE — Patient Instructions (Signed)
We will arrange an ultrasound in February 2023!  I recommend Benefiber 2 teaspoons daily in the beverage of your choice.  Keep a log of foods that "trigger" looser stools. If this worsens, please call.  We will see you in 6 months!  I enjoyed seeing you again today! As you know, I value our relationship and want to provide genuine, compassionate, and quality care. I welcome your feedback. If you receive a survey regarding your visit,  I greatly appreciate you taking time to fill this out. See you next time!  Gelene Mink, PhD, ANP-BC [facility]

## 2021-09-11 ENCOUNTER — Encounter: Payer: Self-pay | Admitting: Gastroenterology

## 2021-09-11 ENCOUNTER — Telehealth: Payer: Self-pay | Admitting: Gastroenterology

## 2021-09-11 NOTE — Telephone Encounter (Signed)
Phoned and spoke with the pt and he stated that he just had blood drawn a few days ago and it is showing on his portal through [place].

## 2021-09-11 NOTE — Telephone Encounter (Signed)
Eric Dougherty:  Patient states labs were done with PCP recently, but they sent from Dec 2021. Can we request more recent?  [name], if he hasn't had any done recently, I need him to complete a CBC, CMP, and INR. Thanks!

## 2021-09-17 DIAGNOSIS — M9902 Segmental and somatic dysfunction of thoracic region: Secondary | ICD-10-CM | POA: Diagnosis not present

## 2021-09-17 DIAGNOSIS — M9903 Segmental and somatic dysfunction of lumbar region: Secondary | ICD-10-CM | POA: Diagnosis not present

## 2021-09-17 DIAGNOSIS — M546 Pain in thoracic spine: Secondary | ICD-10-CM | POA: Diagnosis not present

## 2021-09-17 DIAGNOSIS — M5441 Lumbago with sciatica, right side: Secondary | ICD-10-CM | POA: Diagnosis not present

## 2021-09-17 DIAGNOSIS — M9901 Segmental and somatic dysfunction of cervical region: Secondary | ICD-10-CM | POA: Diagnosis not present

## 2021-09-17 DIAGNOSIS — M542 Cervicalgia: Secondary | ICD-10-CM | POA: Diagnosis not present

## 2021-10-01 DIAGNOSIS — N200 Calculus of kidney: Secondary | ICD-10-CM | POA: Diagnosis not present

## 2021-10-01 DIAGNOSIS — N5201 Erectile dysfunction due to arterial insufficiency: Secondary | ICD-10-CM | POA: Diagnosis not present

## 2021-10-01 DIAGNOSIS — R3912 Poor urinary stream: Secondary | ICD-10-CM | POA: Diagnosis not present

## 2021-10-01 NOTE — Telephone Encounter (Signed)
No INR from PCP.  However, labs from Nov 2022 reviewed: Hgb 14, Hct 41.5, platelets 150. Tbili 0.4, Alk Phos 68, AST 22, ALT 31.    We will see him in 6 months.

## 2021-10-29 DIAGNOSIS — M542 Cervicalgia: Secondary | ICD-10-CM | POA: Diagnosis not present

## 2021-10-29 DIAGNOSIS — M9903 Segmental and somatic dysfunction of lumbar region: Secondary | ICD-10-CM | POA: Diagnosis not present

## 2021-10-29 DIAGNOSIS — M546 Pain in thoracic spine: Secondary | ICD-10-CM | POA: Diagnosis not present

## 2021-10-29 DIAGNOSIS — M9902 Segmental and somatic dysfunction of thoracic region: Secondary | ICD-10-CM | POA: Diagnosis not present

## 2021-10-29 DIAGNOSIS — M9901 Segmental and somatic dysfunction of cervical region: Secondary | ICD-10-CM | POA: Diagnosis not present

## 2021-12-04 IMAGING — US US ABDOMEN LIMITED
1 series · 14 of 25 positions shown · non-contrast
Comparison: August 24, 2019

CLINICAL DATA: Cirrhosis.  Hepatoma screening.

EXAM:
ULTRASOUND ABDOMEN LIMITED RIGHT UPPER QUADRANT

[Series 1: us abdomen limited ruq · 14 of 48 slices shown]
[im 1/48]
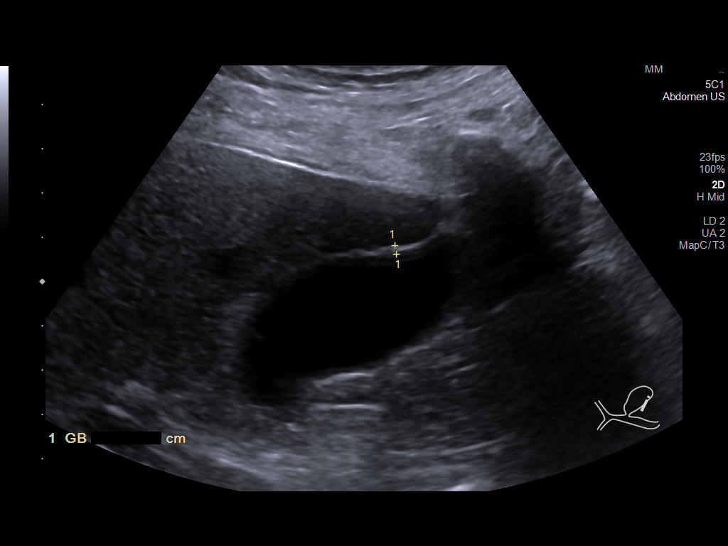
[im 4/48]
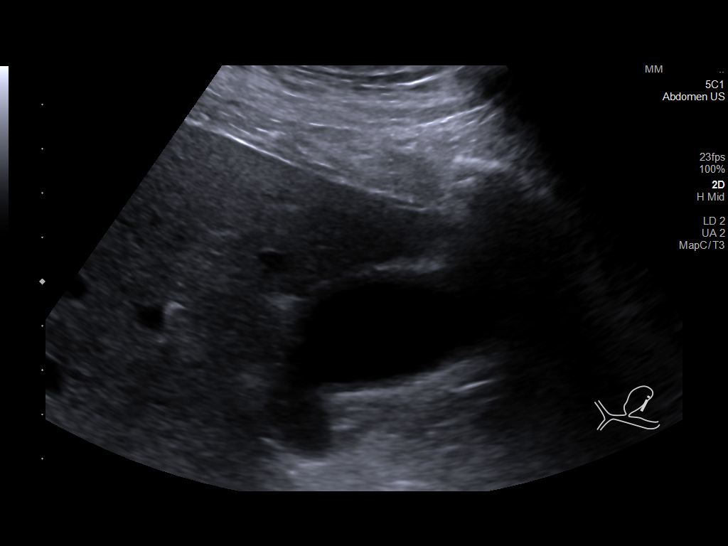
[im 8/48]
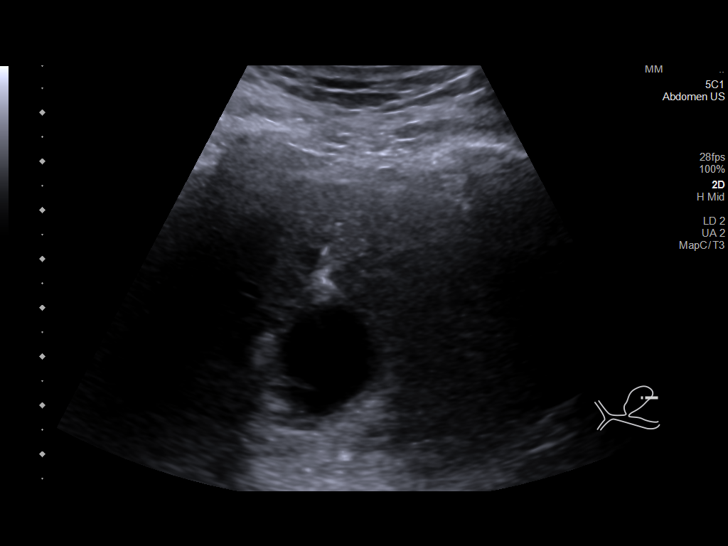
[im 12/48]
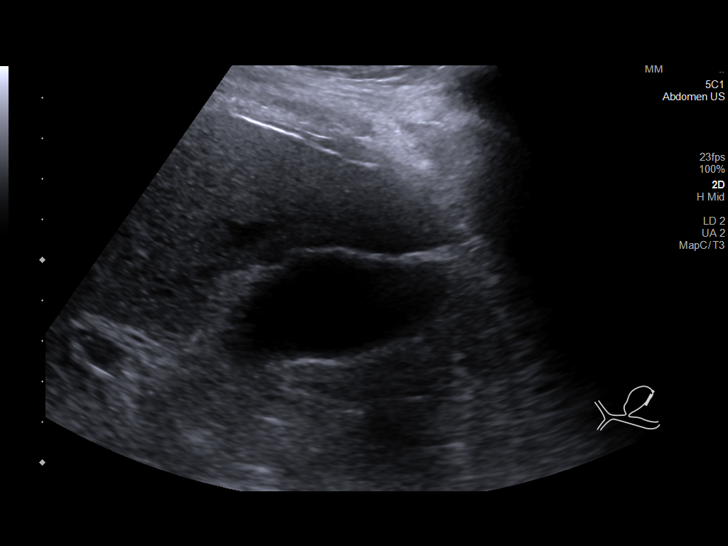
[im 16/48]
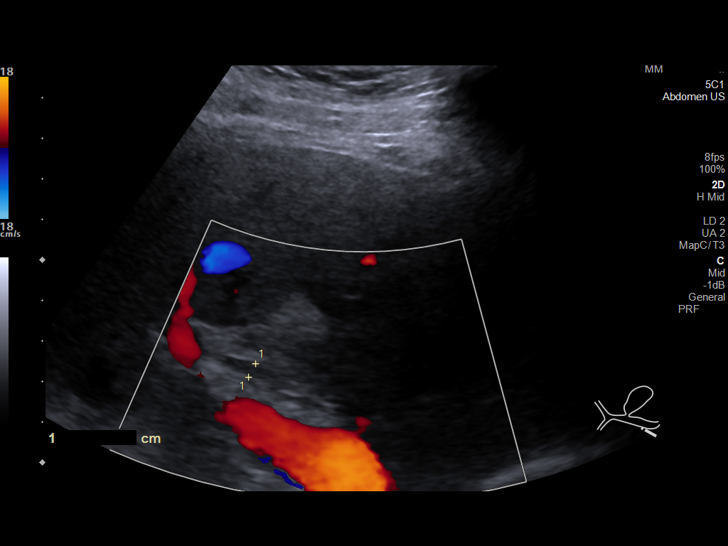
[im 18/48]
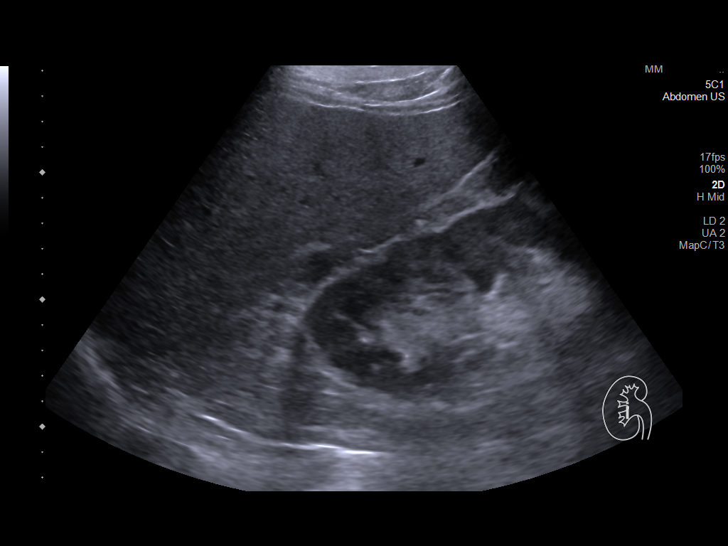
[im 22/48]
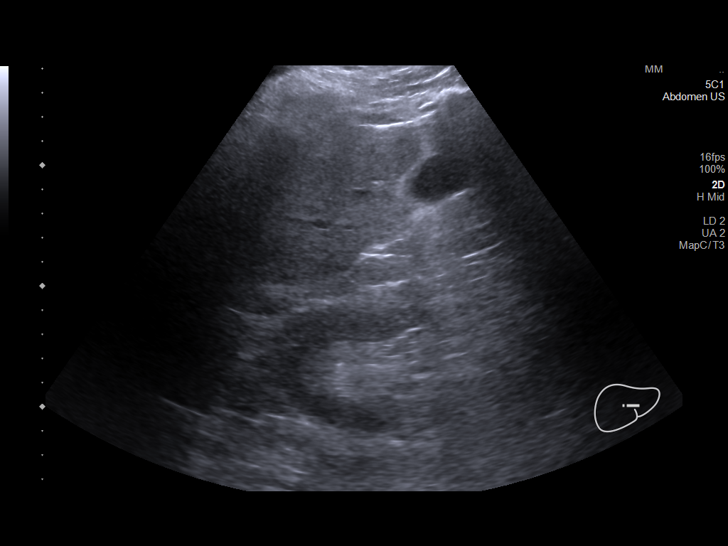
[im 26/48]
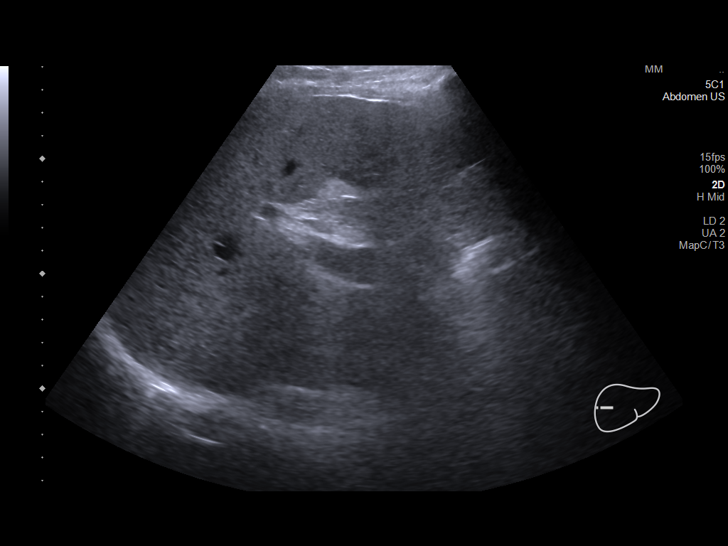
[im 30/48]
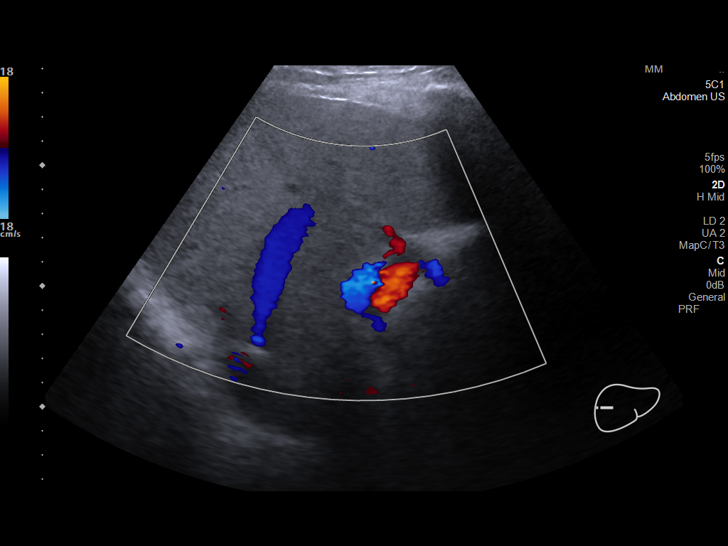
[im 32/48]
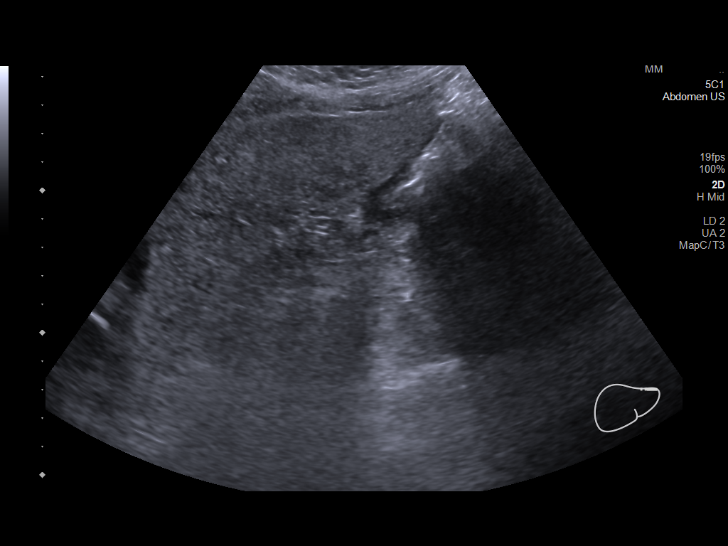
[im 36/48]
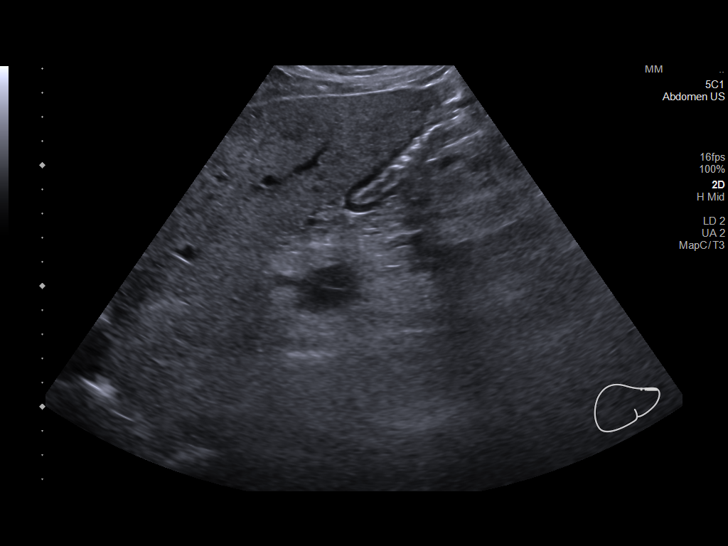
[im 40/48]
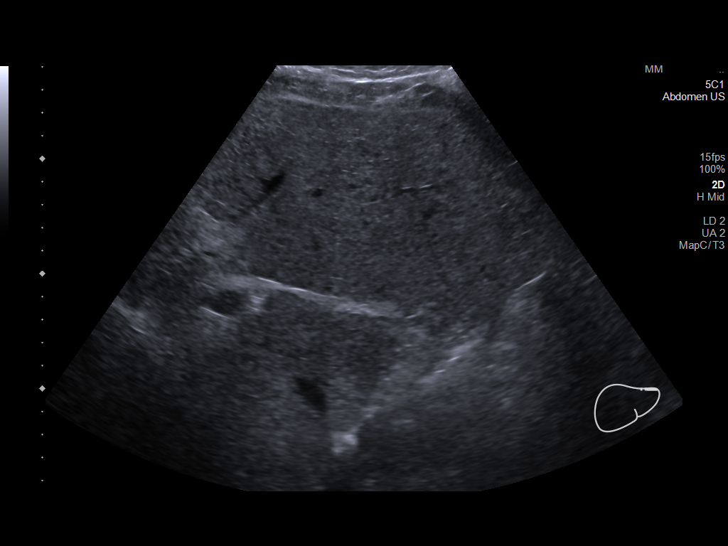
[im 44/48]
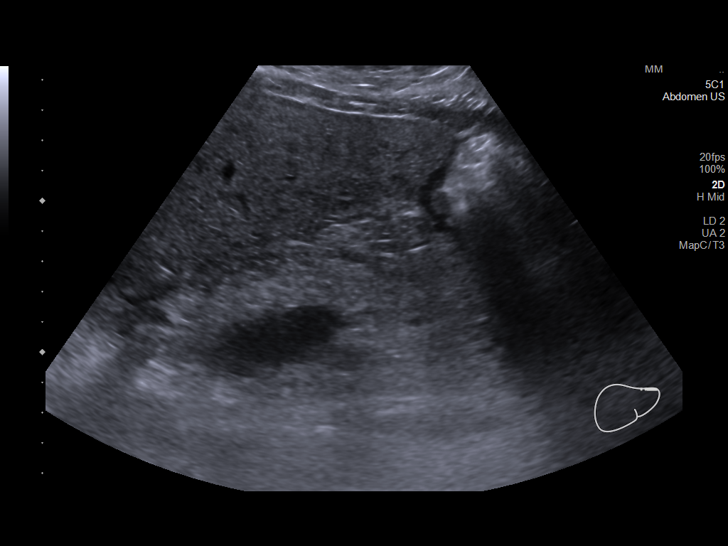
[im 48/48]
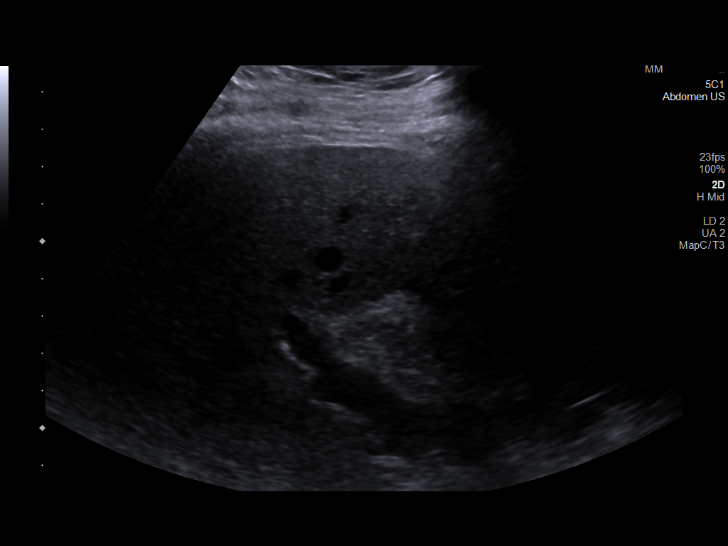

[14 of 25 positions shown; findings below may reference images not displayed]

FINDINGS: Gallbladder:

Possible 5 mm stone or polyp in the gallbladder, only seen on supine
imaging. The gallbladder is otherwise normal in appearance.

Common bile duct:

Diameter: 4 mm

Liver:

Increased heterogeneous echotexture with no focal mass. Portal vein
is patent on color Doppler imaging with normal direction of blood
flow towards the liver.

Other: None.
IMPRESSION: 1. 5 mm stone or polyp in the gallbladder.
2. Heterogeneous increased echotexture consistent with known
cirrhosis. No focal mass identified.

## 2021-12-24 DIAGNOSIS — M9902 Segmental and somatic dysfunction of thoracic region: Secondary | ICD-10-CM | POA: Diagnosis not present

## 2021-12-24 DIAGNOSIS — M542 Cervicalgia: Secondary | ICD-10-CM | POA: Diagnosis not present

## 2021-12-24 DIAGNOSIS — M9901 Segmental and somatic dysfunction of cervical region: Secondary | ICD-10-CM | POA: Diagnosis not present

## 2021-12-24 DIAGNOSIS — M9903 Segmental and somatic dysfunction of lumbar region: Secondary | ICD-10-CM | POA: Diagnosis not present

## 2021-12-24 DIAGNOSIS — M546 Pain in thoracic spine: Secondary | ICD-10-CM | POA: Diagnosis not present

## 2022-01-05 ENCOUNTER — Telehealth: Payer: Self-pay | Admitting: Internal Medicine

## 2022-01-05 NOTE — Telephone Encounter (Signed)
RECALL FOR ULTRASOUND 

## 2022-01-06 NOTE — Telephone Encounter (Signed)
Recall sent 

## 2022-01-21 ENCOUNTER — Telehealth: Payer: Self-pay | Admitting: Internal Medicine

## 2022-01-21 DIAGNOSIS — K7460 Unspecified cirrhosis of liver: Secondary | ICD-10-CM

## 2022-01-21 NOTE — Telephone Encounter (Signed)
Called pt and he is aware of [place] appt ?

## 2022-01-21 NOTE — Addendum Note (Signed)
Addended by: Armstead Peaks on: 01/21/2022 03:48 PM ? ? Modules accepted: Orders ? ?

## 2022-01-21 NOTE — Telephone Encounter (Signed)
PATIENT RECEIVED LETTER TO SCHEDULE ULTRASOUND 

## 2022-01-27 ENCOUNTER — Other Ambulatory Visit: Payer: Self-pay

## 2022-01-27 ENCOUNTER — Ambulatory Visit (HOSPITAL_COMMUNITY)
Admission: RE | Admit: 2022-01-27 | Discharge: 2022-01-27 | Disposition: A | Discharge status: Home / Self Care | Payer: Medicare PPO | Source: Ambulatory Visit | Attending: Internal Medicine | Admitting: Internal Medicine

## 2022-01-27 DIAGNOSIS — K7460 Unspecified cirrhosis of liver: Secondary | ICD-10-CM | POA: Insufficient documentation

## 2022-02-18 DIAGNOSIS — M9903 Segmental and somatic dysfunction of lumbar region: Secondary | ICD-10-CM | POA: Diagnosis not present

## 2022-02-18 DIAGNOSIS — M9902 Segmental and somatic dysfunction of thoracic region: Secondary | ICD-10-CM | POA: Diagnosis not present

## 2022-02-18 DIAGNOSIS — M542 Cervicalgia: Secondary | ICD-10-CM | POA: Diagnosis not present

## 2022-02-18 DIAGNOSIS — M546 Pain in thoracic spine: Secondary | ICD-10-CM | POA: Diagnosis not present

## 2022-02-18 DIAGNOSIS — M9901 Segmental and somatic dysfunction of cervical region: Secondary | ICD-10-CM | POA: Diagnosis not present

## 2022-03-09 DIAGNOSIS — B078 Other viral warts: Secondary | ICD-10-CM | POA: Diagnosis not present

## 2022-03-09 DIAGNOSIS — L821 Other seborrheic keratosis: Secondary | ICD-10-CM | POA: Diagnosis not present

## 2022-03-09 DIAGNOSIS — X32XXXA Exposure to sunlight, initial encounter: Secondary | ICD-10-CM | POA: Diagnosis not present

## 2022-03-09 DIAGNOSIS — L570 Actinic keratosis: Secondary | ICD-10-CM | POA: Diagnosis not present

## 2022-03-09 DIAGNOSIS — L918 Other hypertrophic disorders of the skin: Secondary | ICD-10-CM | POA: Diagnosis not present

## 2022-03-10 ENCOUNTER — Ambulatory Visit: Payer: Medicare PPO | Admitting: Gastroenterology

## 2022-03-10 ENCOUNTER — Encounter: Payer: Self-pay | Admitting: Gastroenterology

## 2022-03-10 VITALS — BP 130/64 | HR 78 | Temp 97.3°F | Ht 69.0 in | Wt 247.8 lb

## 2022-03-10 DIAGNOSIS — K7460 Unspecified cirrhosis of liver: Secondary | ICD-10-CM | POA: Diagnosis not present

## 2022-03-10 NOTE — Patient Instructions (Signed)
Please have blood work done today. ? ?Next ultrasound will be in September 2023! ? ?We will see you back in 1 year or sooner if needed ? ?I enjoyed seeing you again today! As you know, I value our relationship and want to provide genuine, compassionate, and quality care. I welcome your feedback. If you receive a survey regarding your visit,  I greatly appreciate you taking time to fill this out. See you next time! ? ?Annitta Needs, PhD, ANP-BC ?[facility]  ? ?

## 2022-03-10 NOTE — Progress Notes (Signed)
? ? ?Gastroenterology Office Note   ? ? ?Primary Care Physician:  Avis Epley, PA-C  ?Primary Gastroenterologist: Dr. Jena Gauss  ? ? ?Chief Complaint  ? ?Chief Complaint  ?Patient presents with  ? Follow-up  ?  cirrhosis  ? ? ? ?History of Present Illness  ? ?KRIS NO is a 71 y.o. 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 Hep B serologies negative, Hep C negative, completed Hep B vaccinations. Recent colonoscopy normal in March 2022. 5 year surveillance recommended. 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 ? ? ?RUQ [place] due again in Sept 2023. Had looser stools in the past and added fiber with improvement. Not taking any fiber currently. No abdominal pain, N/V, changes in bowel habits, constipation, diarrhea, overt GI bleeding, GERD, dysphagia, unexplained weight loss, lack of appetite, unexplained weight gain. No mental status changes or confusion. No jaundice or pruritis.  ? ? ?Past Medical History:  ?Diagnosis Date  ? Cirrhosis (HCC)   ? Diabetes mellitus   ? resolution of symptoms  ? Hypertension   ? no longer on meds  ? PONV (postoperative nausea and vomiting)   ? Sciatica   ? ? ?Past Surgical History:  ?Procedure Laterality Date  ? BIOPSY  11/09/2018  ? Procedure: BIOPSY;  Surgeon: Corbin Ade, MD;  [facility];  Service: Endoscopy;;  gastric ?  ? COLONOSCOPY  11/20/2005  ? RMR: Minimal hemorrhoids, otherwise normal  ? COLONOSCOPY N/A 03/25/2016  ? three semi-pedunculated polyps 4-6 mm in size, multiple medium-mouthed sigmoid diverticula (hyperplastic).   ? COLONOSCOPY WITH PROPOFOL N/A 01/02/2021  ? normal  ? ESOPHAGOGASTRODUODENOSCOPY N/A 11/09/2018  ? 11/09/2018 with erosive reflux esophagitis, erythematous mucosa in the stomach status post biopsy found to be mild chronic gastritis.  ? ESOPHAGOGASTRODUODENOSCOPY (EGD) WITH PROPOFOL N/A 01/02/2021  ? 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  ? HEMORRHOID SURGERY    ? HERNIA REPAIR    ? LITHOTRIPSY    ? TONSILLECTOMY    ? TOOTH EXTRACTION    ? ? ?Current Outpatient Medications  ?Medication Sig Dispense Refill  ? aspirin EC 81 MG tablet Take 81 mg by mouth daily.    ? atorvastatin (LIPITOR) 40 MG tablet TAKE 1 TABLET ONCE DAILY. (Patient taking differently: Take 40 mg by mouth daily.) 90 tablet 3  ? Cholecalciferol (VITAMIN D3) 50 MCG (2000 UT) TABS Take 2,000 Units by mouth daily.    ? famotidine (PEPCID) 20 MG tablet Take 20 mg by mouth daily.    ? folic acid (FOLVITE) 1 MG tablet Take 1,000 mcg by mouth daily.    ? Ibuprofen-Acetaminophen (ADVIL DUAL ACTION) 125-250 MG TABS Take 1 tablet by mouth daily as needed (Pain).    ? latanoprost (XALATAN) 0.005 % ophthalmic solution Place 1 drop into both eyes at bedtime.    ? lisinopril (PRINIVIL,ZESTRIL) 2.5 MG tablet Take 2.5 mg by mouth daily.    ? metFORMIN (GLUCOPHAGE) 1000 MG tablet Take 1,000 mg by mouth 2 (two) times daily.    ? naproxen sodium (ALEVE) 220 MG tablet Take 440 mg by mouth daily as needed (pain).    ? tamsulosin (FLOMAX) 0.4 MG CAPS capsule Take 0.4 mg by mouth daily.     ? thiamine (VITAMIN B-1) 100 MG tablet Take 100 mg by mouth daily.    ? vitamin C (ASCORBIC ACID) 500 MG tablet Take  500 mg by mouth daily.    ? zinc gluconate 50 MG tablet Take 50 mg by mouth daily.    ? ?No current facility-administered medications for this visit.  ? ? ?Allergies as of 03/10/2022  ? (No Known Allergies)  ? ? ?Family History  ?Problem Relation Age of Onset  ? Leukemia Father   ? Hypothyroidism Brother   ? Glaucoma Brother   ? Rectal cancer Brother 472  ?     s/p surgical resection; dx 2 years ago  ? Osteoarthritis Other   ? Prostate cancer Paternal Grandfather   ? Cirrhosis Cousin   ? ? ?Social History  ? ?Socioeconomic History  ? Marital status: Married  ?  Spouse name: Not on file  ? Number of children: Not on file  ? Years of education: Not on file   ? Highest education level: Not on file  ?Occupational History  ? Not on file  ?Tobacco Use  ? Smoking status: Former  ? Smokeless tobacco: Former  ?  Types: Chew  ?  Quit date: 11/02/1978  ?Vaping Use  ? Vaping Use: Never used  ?Substance and Sexual Activity  ? Alcohol use: Not Currently  ?  Alcohol/week: 7.0 standard drinks  ?  Types: 7 Standard drinks or equivalent per week  ?  Comment: 2-3 at a time; 2-3 times a week; previously: none in 2019 but did have 1 drink in June on vacation.  ? Drug use: No  ? Sexual activity: Not on file  ?Other Topics Concern  ? Not on file  ?Social History Narrative  ? Not on file  ? ?Social Determinants of Health  ? ?Financial Resource Strain: Not on file  ?Food Insecurity: Not on file  ?Transportation Needs: Not on file  ?Physical Activity: Not on file  ?Stress: Not on file  ?Social Connections: Not on file  ?Intimate Partner Violence: Not on file  ? ? ? ?Review of Systems  ? ?Gen: Denies any fever, chills, fatigue, weight loss, lack of appetite.  ?CV: Denies chest pain, heart palpitations, peripheral edema, syncope.  ?Resp: Denies shortness of breath at rest or with exertion. Denies wheezing or cough.  ?GI: see HPI  ?GU : Denies urinary burning, urinary frequency, urinary hesitancy ?MS: Denies joint pain, muscle weakness, cramps, or limitation of movement.  ?Derm: Denies rash, itching, dry skin ?Psych: Denies depression, anxiety, memory loss, and confusion ?Heme: Denies bruising, bleeding, and enlarged lymph nodes. ? ? ?Physical Exam  ? ?BP 130/64   Pulse 78   Temp (!) 97.3 ?F (36.3 ?C)   Ht 5\' 9"  (1.753 m)   Wt 247 lb 12.8 oz (112.4 kg)   BMI 36.59 kg/m?  ?General:   Alert and oriented. Pleasant and cooperative. Well-nourished and well-developed.  ?Head:  Normocephalic and atraumatic. ?Eyes:  Without icterus ?Abdomen:  +BS, soft, non-tender and non-distended. No HSM noted. No guarding or rebound. No masses appreciated.  ?Rectal:  Deferred  ?Msk:  Symmetrical without gross  deformities. Normal posture. ?Extremities:  Without edema. ?Neurologic:  Alert and  oriented x4;  grossly normal neurologically. ?Skin:  Intact without significant lesions or rashes. ?Psych:  Alert and cooperative. Normal mood and affect. ? ? ?Assessment  ? ?Corie ChiquitoCharles W Sergent is a 71 y.o. 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 Hep B serologies negative, Hep C negative, completed Hep B vaccinations. Here for routine follow-up. ? ? ?Cirrhosis: very well compensated. In fact, likely  dealing moreso with fatty liver and underlying cirrhosis than advanced liver disease. Most recent US with fatty liver. Will continue with serial imaging. Next EGD around 2024-2025. Labs today.  ? ? ? ?PLAN  ? ? ?CMP, INR,AFP ?[place] in Sept 2023 ?Return in 1 year or sooner if needed ?EGD 2024-2025 ? ? ?Gelene Mink, PhD, ANP-BC ?[facility]  ? ? ?

## 2022-03-12 LAB — CBC WITH DIFFERENTIAL/PLATELET
Absolute Monocytes: 445 cells/uL (ref 200–950)
Basophils Absolute: 44 cells/uL (ref 0–200)
Basophils Relative: 0.6 %
Eosinophils Absolute: 131 cells/uL (ref 15–500)
Eosinophils Relative: 1.8 %
HCT: 43.7 % (ref 38.5–50.0)
Hemoglobin: 14.9 g/dL (ref 13.2–17.1)
Lymphs Abs: 1526 cells/uL (ref 850–3900)
MCH: 31.7 pg (ref 27.0–33.0)
MCHC: 34.1 g/dL (ref 32.0–36.0)
MCV: 93 fL (ref 80.0–100.0)
MPV: 12.1 fL (ref 7.5–12.5)
Monocytes Relative: 6.1 %
Neutro Abs: 5154 cells/uL (ref 1500–7800)
Neutrophils Relative %: 70.6 %
Platelets: 135 10*3/uL — ABNORMAL LOW (ref 140–400)
RBC: 4.7 10*6/uL (ref 4.20–5.80)
RDW: 13 % (ref 11.0–15.0)
Total Lymphocyte: 20.9 %
WBC: 7.3 10*3/uL (ref 3.8–10.8)

## 2022-03-12 LAB — COMPLETE METABOLIC PANEL WITH GFR
AG Ratio: 1.8 (calc) (ref 1.0–2.5)
ALT: 25 U/L (ref 9–46)
AST: 21 U/L (ref 10–35)
Albumin: 4.2 g/dL (ref 3.6–5.1)
Alkaline phosphatase (APISO): 60 U/L (ref 35–144)
BUN: 13 mg/dL (ref 7–25)
CO2: 28 mmol/L (ref 20–32)
Calcium: 9.5 mg/dL (ref 8.6–10.3)
Chloride: 103 mmol/L (ref 98–110)
Creat: 0.79 mg/dL (ref 0.70–1.28)
Globulin: 2.3 g/dL (calc) (ref 1.9–3.7)
Glucose, Bld: 162 mg/dL — ABNORMAL HIGH (ref 65–99)
Potassium: 4.3 mmol/L (ref 3.5–5.3)
Sodium: 139 mmol/L (ref 135–146)
Total Bilirubin: 0.6 mg/dL (ref 0.2–1.2)
Total Protein: 6.5 g/dL (ref 6.1–8.1)
eGFR: 95 mL/min/{1.73_m2} (ref >=60)

## 2022-03-12 LAB — PROTIME-INR
INR: 1
Prothrombin Time: 10.7 s (ref 9.0–11.5)

## 2022-03-12 LAB — AFP TUMOR MARKER: AFP-Tumor Marker: 1.8 ng/mL (ref <6.1)

## 2022-04-22 DIAGNOSIS — M9903 Segmental and somatic dysfunction of lumbar region: Secondary | ICD-10-CM | POA: Diagnosis not present

## 2022-04-22 DIAGNOSIS — M546 Pain in thoracic spine: Secondary | ICD-10-CM | POA: Diagnosis not present

## 2022-04-22 DIAGNOSIS — M9902 Segmental and somatic dysfunction of thoracic region: Secondary | ICD-10-CM | POA: Diagnosis not present

## 2022-04-22 DIAGNOSIS — M542 Cervicalgia: Secondary | ICD-10-CM | POA: Diagnosis not present

## 2022-04-22 DIAGNOSIS — M9901 Segmental and somatic dysfunction of cervical region: Secondary | ICD-10-CM | POA: Diagnosis not present

## 2022-06-16 ENCOUNTER — Telehealth: Payer: Self-pay | Admitting: *Deleted

## 2022-06-16 DIAGNOSIS — K7460 Unspecified cirrhosis of liver: Secondary | ICD-10-CM

## 2022-06-16 NOTE — Telephone Encounter (Signed)
Patient is on recall for 6 mth US 

## 2022-06-16 NOTE — Telephone Encounter (Signed)
Recall sent 

## 2022-06-19 DIAGNOSIS — N138 Other obstructive and reflux uropathy: Secondary | ICD-10-CM | POA: Diagnosis not present

## 2022-06-19 DIAGNOSIS — E291 Testicular hypofunction: Secondary | ICD-10-CM | POA: Diagnosis not present

## 2022-06-19 DIAGNOSIS — I10 Essential (primary) hypertension: Secondary | ICD-10-CM | POA: Diagnosis not present

## 2022-06-19 DIAGNOSIS — Z1331 Encounter for screening for depression: Secondary | ICD-10-CM | POA: Diagnosis not present

## 2022-06-19 DIAGNOSIS — E7849 Other hyperlipidemia: Secondary | ICD-10-CM | POA: Diagnosis not present

## 2022-06-19 DIAGNOSIS — Z0001 Encounter for general adult medical examination with abnormal findings: Secondary | ICD-10-CM | POA: Diagnosis not present

## 2022-06-19 DIAGNOSIS — K7460 Unspecified cirrhosis of liver: Secondary | ICD-10-CM | POA: Diagnosis not present

## 2022-06-19 DIAGNOSIS — E1165 Type 2 diabetes mellitus with hyperglycemia: Secondary | ICD-10-CM | POA: Diagnosis not present

## 2022-06-19 DIAGNOSIS — E782 Mixed hyperlipidemia: Secondary | ICD-10-CM | POA: Diagnosis not present

## 2022-06-24 NOTE — Addendum Note (Signed)
Addended by: Armstead Peaks on: 06/24/2022 08:47 AM   Modules accepted: Orders

## 2022-06-24 NOTE — Telephone Encounter (Signed)
Pt called to get RUQ [place] scheduled for sept.

## 2022-07-14 DIAGNOSIS — H401132 Primary open-angle glaucoma, bilateral, moderate stage: Secondary | ICD-10-CM | POA: Diagnosis not present

## 2022-07-15 DIAGNOSIS — M9901 Segmental and somatic dysfunction of cervical region: Secondary | ICD-10-CM | POA: Diagnosis not present

## 2022-07-15 DIAGNOSIS — M542 Cervicalgia: Secondary | ICD-10-CM | POA: Diagnosis not present

## 2022-07-15 DIAGNOSIS — M546 Pain in thoracic spine: Secondary | ICD-10-CM | POA: Diagnosis not present

## 2022-07-15 DIAGNOSIS — M9902 Segmental and somatic dysfunction of thoracic region: Secondary | ICD-10-CM | POA: Diagnosis not present

## 2022-07-15 DIAGNOSIS — M9903 Segmental and somatic dysfunction of lumbar region: Secondary | ICD-10-CM | POA: Diagnosis not present

## 2022-07-22 ENCOUNTER — Ambulatory Visit (HOSPITAL_COMMUNITY)
Admission: RE | Admit: 2022-07-22 | Discharge: 2022-07-22 | Disposition: A | Discharge status: Home / Self Care | Payer: Medicare PPO | Source: Ambulatory Visit | Attending: Gastroenterology | Admitting: Gastroenterology

## 2022-07-22 DIAGNOSIS — K7460 Unspecified cirrhosis of liver: Secondary | ICD-10-CM | POA: Insufficient documentation

## 2022-07-22 DIAGNOSIS — K8020 Calculus of gallbladder without cholecystitis without obstruction: Secondary | ICD-10-CM | POA: Diagnosis not present

## 2022-07-22 DIAGNOSIS — N200 Calculus of kidney: Secondary | ICD-10-CM | POA: Diagnosis not present

## 2022-08-05 DIAGNOSIS — L218 Other seborrheic dermatitis: Secondary | ICD-10-CM | POA: Diagnosis not present

## 2022-08-05 DIAGNOSIS — X32XXXD Exposure to sunlight, subsequent encounter: Secondary | ICD-10-CM | POA: Diagnosis not present

## 2022-08-05 DIAGNOSIS — L570 Actinic keratosis: Secondary | ICD-10-CM | POA: Diagnosis not present

## 2022-08-05 DIAGNOSIS — B078 Other viral warts: Secondary | ICD-10-CM | POA: Diagnosis not present

## 2022-09-28 DIAGNOSIS — N200 Calculus of kidney: Secondary | ICD-10-CM | POA: Diagnosis not present

## 2022-10-09 DIAGNOSIS — M9903 Segmental and somatic dysfunction of lumbar region: Secondary | ICD-10-CM | POA: Diagnosis not present

## 2022-10-09 DIAGNOSIS — M9901 Segmental and somatic dysfunction of cervical region: Secondary | ICD-10-CM | POA: Diagnosis not present

## 2022-10-09 DIAGNOSIS — M542 Cervicalgia: Secondary | ICD-10-CM | POA: Diagnosis not present

## 2022-10-09 DIAGNOSIS — M9902 Segmental and somatic dysfunction of thoracic region: Secondary | ICD-10-CM | POA: Diagnosis not present

## 2022-10-09 DIAGNOSIS — M546 Pain in thoracic spine: Secondary | ICD-10-CM | POA: Diagnosis not present

## 2022-10-12 DIAGNOSIS — E119 Type 2 diabetes mellitus without complications: Secondary | ICD-10-CM | POA: Diagnosis not present

## 2022-12-03 ENCOUNTER — Encounter: Payer: Self-pay | Admitting: Gastroenterology

## 2022-12-10 DIAGNOSIS — G4733 Obstructive sleep apnea (adult) (pediatric): Secondary | ICD-10-CM | POA: Diagnosis not present

## 2022-12-31 ENCOUNTER — Telehealth: Payer: Self-pay | Admitting: *Deleted

## 2022-12-31 ENCOUNTER — Other Ambulatory Visit: Payer: Self-pay | Admitting: *Deleted

## 2022-12-31 DIAGNOSIS — K7460 Unspecified cirrhosis of liver: Secondary | ICD-10-CM

## 2022-12-31 NOTE — Telephone Encounter (Signed)
Pt left vm stating it was time to schedule his RUQ [place].  Advised pt [place] is scheduled for 01/08/23, arrive at 12:15 pm at [facility], nothing to eat or drink 6-8 hours prior. Pt verbalized understanding.

## 2023-01-01 DIAGNOSIS — M546 Pain in thoracic spine: Secondary | ICD-10-CM | POA: Diagnosis not present

## 2023-01-01 DIAGNOSIS — M9901 Segmental and somatic dysfunction of cervical region: Secondary | ICD-10-CM | POA: Diagnosis not present

## 2023-01-01 DIAGNOSIS — M9903 Segmental and somatic dysfunction of lumbar region: Secondary | ICD-10-CM | POA: Diagnosis not present

## 2023-01-01 DIAGNOSIS — M9902 Segmental and somatic dysfunction of thoracic region: Secondary | ICD-10-CM | POA: Diagnosis not present

## 2023-01-01 DIAGNOSIS — M542 Cervicalgia: Secondary | ICD-10-CM | POA: Diagnosis not present

## 2023-01-08 ENCOUNTER — Ambulatory Visit (HOSPITAL_COMMUNITY)
Admission: RE | Admit: 2023-01-08 | Discharge: 2023-01-08 | Disposition: A | Discharge status: Home / Self Care | Payer: Medicare PPO | Source: Ambulatory Visit | Attending: Gastroenterology | Admitting: Gastroenterology

## 2023-01-08 DIAGNOSIS — K7460 Unspecified cirrhosis of liver: Secondary | ICD-10-CM | POA: Insufficient documentation

## 2023-01-08 DIAGNOSIS — G4733 Obstructive sleep apnea (adult) (pediatric): Secondary | ICD-10-CM | POA: Diagnosis not present

## 2023-01-08 DIAGNOSIS — N200 Calculus of kidney: Secondary | ICD-10-CM | POA: Diagnosis not present

## 2023-01-08 DIAGNOSIS — K8020 Calculus of gallbladder without cholecystitis without obstruction: Secondary | ICD-10-CM | POA: Diagnosis not present

## 2023-02-03 ENCOUNTER — Encounter: Payer: Self-pay | Admitting: Gastroenterology

## 2023-02-08 DIAGNOSIS — G4733 Obstructive sleep apnea (adult) (pediatric): Secondary | ICD-10-CM | POA: Diagnosis not present

## 2023-02-26 DIAGNOSIS — M542 Cervicalgia: Secondary | ICD-10-CM | POA: Diagnosis not present

## 2023-02-26 DIAGNOSIS — M9903 Segmental and somatic dysfunction of lumbar region: Secondary | ICD-10-CM | POA: Diagnosis not present

## 2023-02-26 DIAGNOSIS — M546 Pain in thoracic spine: Secondary | ICD-10-CM | POA: Diagnosis not present

## 2023-02-26 DIAGNOSIS — M9902 Segmental and somatic dysfunction of thoracic region: Secondary | ICD-10-CM | POA: Diagnosis not present

## 2023-02-26 DIAGNOSIS — M9901 Segmental and somatic dysfunction of cervical region: Secondary | ICD-10-CM | POA: Diagnosis not present

## 2023-03-07 ENCOUNTER — Other Ambulatory Visit: Payer: Self-pay | Admitting: Urology

## 2023-03-10 DIAGNOSIS — G4733 Obstructive sleep apnea (adult) (pediatric): Secondary | ICD-10-CM | POA: Diagnosis not present

## 2023-03-18 ENCOUNTER — Encounter: Payer: Self-pay | Admitting: Gastroenterology

## 2023-03-18 ENCOUNTER — Ambulatory Visit: Payer: Medicare PPO | Admitting: Gastroenterology

## 2023-03-18 VITALS — BP 150/80 | HR 69 | Temp 96.6°F | Ht 68.0 in | Wt 227.9 lb

## 2023-03-18 DIAGNOSIS — K7469 Other cirrhosis of liver: Secondary | ICD-10-CM

## 2023-03-18 NOTE — Patient Instructions (Signed)
Please have blood work done tomorrow when you see Lelon Mast.  We will do an ultrasound in September 2024 and send letter when it is time!  I can see you again in March 2025, unless you have any concerns in the meantime!  Enjoy working on the house!  I enjoyed seeing you again today! I value our relationship and want to provide genuine, compassionate, and quality care. You may receive a survey regarding your visit with me, and I welcome your feedback! Thanks so much for taking the time to complete this. I look forward to seeing you again.      Gelene Mink, PhD, ANP-BC [facility]

## 2023-03-18 NOTE — Progress Notes (Signed)
Gastroenterology Office Note     Primary Care Physician:  Ladon Applebaum  Primary Gastroenterologist:Dr. Jena Gauss    Chief Complaint   Chief Complaint  Patient presents with   Follow-up    Patient here today for a follow up appointment. Patient denies any current gi issues.     History of Present Illness   Eric Dougherty is a 72 y.o. male presenting today in follow-up with a history of advanced fibrosis, possible underlying cirrhosis likely due to hepatic steatosis and prior alcohol.  Hep B serologies negative, Hep C negative, completed Hep B vaccinations. Recent colonoscopy normal in March 2022. 5 year surveillance recommended. 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    Recent US March 2024 with hepatic steatosis. Hepatic steatosis has been noted on prior imaging as well. Elastography 2019 with F2 and F3. We have been on the proactive side of serial ultrasounds, MELD and AFP.   No abdominal pain, N/V, changes in bowel habits, constipation, diarrhea, overt GI bleeding, GERD, dysphagia, unexplained weight loss, lack of appetite, unexplained weight gain. No jaundice or pruritus. No abdominal distension or edema.   He just bought a house in [place], [place]. 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      Past Medical History:  Diagnosis Date   Cirrhosis (HCC)    Diabetes mellitus    resolution of symptoms   Hypertension    no longer on meds   PONV (postoperative nausea and vomiting)    Sciatica     Past Surgical History:  Procedure Laterality Date   BIOPSY  11/09/2018   Procedure: BIOPSY;  Surgeon: Corbin Ade, MD;  [facility];  Service: Endoscopy;;  gastric    COLONOSCOPY  11/20/2005   RMR: Minimal hemorrhoids, otherwise normal   COLONOSCOPY N/A 03/25/2016   three semi-pedunculated polyps 4-6 mm in size, multiple medium-mouthed sigmoid diverticula (hyperplastic).    COLONOSCOPY WITH PROPOFOL  N/A 01/02/2021   normal   ESOPHAGOGASTRODUODENOSCOPY N/A 11/09/2018   11/09/2018 with erosive reflux esophagitis, erythematous mucosa in the stomach status post biopsy found to be mild chronic gastritis.   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   HEMORRHOID SURGERY     HERNIA REPAIR     LITHOTRIPSY     TONSILLECTOMY     TOOTH EXTRACTION      Current Outpatient Medications  Medication Sig Dispense Refill   aspirin EC 81 MG tablet Take 81 mg by mouth daily.     atorvastatin (LIPITOR) 40 MG tablet TAKE 1 TABLET ONCE DAILY. (Patient taking differently: Take 40 mg by mouth daily.) 90 tablet 3   Cholecalciferol (VITAMIN D3) 50 MCG (2000 UT) TABS Take 2,000 Units by mouth daily.     empagliflozin (JARDIANCE) 25 MG TABS tablet Take 25 mg by mouth daily.     famotidine (PEPCID) 20 MG tablet Take 20 mg by mouth daily.     folic acid (FOLVITE) 1 MG tablet Take 1,000 mcg by mouth daily.     Ibuprofen-Acetaminophen (ADVIL DUAL ACTION) 125-250 MG TABS Take 1 tablet by mouth daily as needed (Pain).     latanoprost (XALATAN) 0.005 % ophthalmic solution Place 1 drop into both eyes at bedtime.     lisinopril (PRINIVIL,ZESTRIL) 2.5 MG tablet Take 2.5 mg by mouth daily.     metFORMIN (GLUCOPHAGE) 1000 MG tablet Take 1,000 mg by mouth 2 (two)  times daily.     naproxen sodium (ALEVE) 220 MG tablet Take 440 mg by mouth daily as needed (pain).     tamsulosin (FLOMAX) 0.4 MG CAPS capsule Take 0.4 mg by mouth daily.      thiamine (VITAMIN B-1) 100 MG tablet Take 100 mg by mouth daily.     vitamin C (ASCORBIC ACID) 500 MG tablet Take 500 mg by mouth daily.     zinc gluconate 50 MG tablet Take 50 mg by mouth daily.     No current facility-administered medications for this visit.    Allergies as of 03/18/2023   (No Known Allergies)    Family History  Problem Relation Age of Onset   Leukemia Father     Hypothyroidism Brother    Glaucoma Brother    Rectal cancer Brother 14       s/p surgical resection; dx 2 years ago   Osteoarthritis Other    Prostate cancer Paternal Grandfather    Cirrhosis Cousin     Social History   Socioeconomic History   Marital status: Married    Spouse name: Not on file   Number of children: Not on file   Years of education: Not on file   Highest education level: Not on file  Occupational History   Not on file  Tobacco Use   Smoking status: Former   Smokeless tobacco: Former    Types: Chew    Quit date: 11/02/1978  Vaping Use   Vaping Use: Never used  Substance and Sexual Activity   Alcohol use: Not Currently    Alcohol/week: 7.0 standard drinks of alcohol    Types: 7 Standard drinks or equivalent per week    Comment: 2-3 at a time; 2-3 times a week; previously: none in 2019 but did have 1 drink in June on vacation.   Drug use: No   Sexual activity: Not on file  Other Topics Concern   Not on file  Social History Narrative   Not on file   Social Determinants of Health   Financial Resource Strain: Not on file  Food Insecurity: Not on file  Transportation Needs: Not on file  Physical Activity: Not on file  Stress: Not on file  Social Connections: Not on file  Intimate Partner Violence: Not on file     Review of Systems   Gen: Denies any fever, chills, fatigue, weight loss, lack of appetite.  CV: Denies chest pain, heart palpitations, peripheral edema, syncope.  Resp: Denies shortness of breath at rest or with exertion. Denies wheezing or cough.  GI: Denies dysphagia or odynophagia. Denies jaundice, hematemesis, fecal incontinence. GU : Denies urinary burning, urinary frequency, urinary hesitancy MS: Denies joint pain, muscle weakness, cramps, or limitation of movement.  Derm: Denies rash, itching, dry skin Psych: Denies depression, anxiety, memory loss, and confusion Heme: Denies bruising, bleeding, and enlarged lymph  nodes.   Physical Exam   BP (!) 150/80 (BP Location: Left Arm, Patient Position: Sitting, Cuff Size: Large)   Pulse 69   Temp (!) 96.6 F (35.9 C) (Temporal)   Ht 5\' 8"  (1.727 m)   Wt 227 lb 14.4 oz (103.4 kg)   BMI 34.65 kg/m  General:   Alert and oriented. Pleasant and cooperative. Well-nourished and well-developed.  Head:  Normocephalic and atraumatic. Eyes:  Without icterus Abdomen:  +BS, soft, non-tender and non-distended. No HSM noted. No guarding or rebound. No masses appreciated.  Rectal:  Deferred  Msk:  Symmetrical without gross deformities. Normal  posture. Extremities:  Without edema. Neurologic:  Alert and  oriented x4;  grossly normal neurologically. Skin:  Intact without significant lesions or rashes. Psych:  Alert and cooperative. Normal mood and affect.   Assessment   Eric Dougherty is a 72 y.o. male presenting today in follow-up with a history of 72 y.o. 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 [place] has been favoring hepatic steatosis although elastography with F2/F3 in the past. 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 We will perform screening in 2025. To be cautious, he is enrolled in cirrhosis care. [place] every 6 months and labs. Recent US stable with hepatic steatosis. Well-compensated. Update labs today and next [place] in Sept 2024.     PLAN    CBC, CMP, INR, AFP now and will continue every 6 months [place] in Sept 2024 and every 6 months EGD in 2025   Eric Mink, PhD, [facility]astroenter[facility]

## 2023-03-19 DIAGNOSIS — K7469 Other cirrhosis of liver: Secondary | ICD-10-CM | POA: Diagnosis not present

## 2023-03-19 DIAGNOSIS — K760 Fatty (change of) liver, not elsewhere classified: Secondary | ICD-10-CM | POA: Diagnosis not present

## 2023-03-19 DIAGNOSIS — N138 Other obstructive and reflux uropathy: Secondary | ICD-10-CM | POA: Diagnosis not present

## 2023-03-19 DIAGNOSIS — E6609 Other obesity due to excess calories: Secondary | ICD-10-CM | POA: Diagnosis not present

## 2023-03-19 DIAGNOSIS — K7460 Unspecified cirrhosis of liver: Secondary | ICD-10-CM | POA: Diagnosis not present

## 2023-03-19 DIAGNOSIS — G4733 Obstructive sleep apnea (adult) (pediatric): Secondary | ICD-10-CM | POA: Diagnosis not present

## 2023-03-19 DIAGNOSIS — E1165 Type 2 diabetes mellitus with hyperglycemia: Secondary | ICD-10-CM | POA: Diagnosis not present

## 2023-03-19 DIAGNOSIS — I10 Essential (primary) hypertension: Secondary | ICD-10-CM | POA: Diagnosis not present

## 2023-03-19 DIAGNOSIS — E782 Mixed hyperlipidemia: Secondary | ICD-10-CM | POA: Diagnosis not present

## 2023-03-19 DIAGNOSIS — E291 Testicular hypofunction: Secondary | ICD-10-CM | POA: Diagnosis not present

## 2023-03-20 LAB — PROTIME-INR
INR: 1 (ref 0.9–1.2)
Prothrombin Time: 11 s (ref 9.1–12.0)

## 2023-03-20 LAB — AFP TUMOR MARKER: AFP, Serum, Tumor Marker: <1.8 ng/mL (ref 0.0–8.4)

## 2023-03-30 ENCOUNTER — Other Ambulatory Visit: Payer: Self-pay

## 2023-03-30 DIAGNOSIS — K7460 Unspecified cirrhosis of liver: Secondary | ICD-10-CM

## 2023-03-31 DIAGNOSIS — K7460 Unspecified cirrhosis of liver: Secondary | ICD-10-CM | POA: Diagnosis not present

## 2023-04-01 LAB — COMPREHENSIVE METABOLIC PANEL
ALT: 23 IU/L (ref 0–44)
AST: 29 IU/L (ref 0–40)
Albumin/Globulin Ratio: 1.8 (ref 1.2–2.2)
Albumin: 4.3 g/dL (ref 3.8–4.8)
Alkaline Phosphatase: 77 IU/L (ref 44–121)
BUN/Creatinine Ratio: 19 (ref 10–24)
BUN: 13 mg/dL (ref 8–27)
Bilirubin Total: 0.4 mg/dL (ref 0.0–1.2)
CO2: 19 mmol/L — ABNORMAL LOW (ref 20–29)
Calcium: 9.2 mg/dL (ref 8.6–10.2)
Chloride: 102 mmol/L (ref 96–106)
Creatinine, Ser: 0.68 mg/dL — ABNORMAL LOW (ref 0.76–1.27)
Globulin, Total: 2.4 g/dL (ref 1.5–4.5)
Glucose: 108 mg/dL — ABNORMAL HIGH (ref 70–99)
Potassium: 4.9 mmol/L (ref 3.5–5.2)
Sodium: 140 mmol/L (ref 134–144)
Total Protein: 6.7 g/dL (ref 6.0–8.5)
eGFR: 99 mL/min/{1.73_m2} (ref >59)

## 2023-04-01 LAB — CBC WITH DIFFERENTIAL/PLATELET
Basophils Absolute: 0 10*3/uL (ref 0.0–0.2)
Basos: 1 %
EOS (ABSOLUTE): 0.1 10*3/uL (ref 0.0–0.4)
Eos: 2 %
Hematocrit: 46.6 % (ref 37.5–51.0)
Hemoglobin: 15.7 g/dL (ref 13.0–17.7)
Immature Grans (Abs): 0 10*3/uL (ref 0.0–0.1)
Immature Granulocytes: 0 %
Lymphocytes Absolute: 1.2 10*3/uL (ref 0.7–3.1)
Lymphs: 19 %
MCH: 30.8 pg (ref 26.6–33.0)
MCHC: 33.7 g/dL (ref 31.5–35.7)
MCV: 92 fL (ref 79–97)
Monocytes Absolute: 0.5 10*3/uL (ref 0.1–0.9)
Monocytes: 7 %
Neutrophils Absolute: 4.4 10*3/uL (ref 1.4–7.0)
Neutrophils: 71 %
Platelets: 147 10*3/uL — ABNORMAL LOW (ref 150–450)
RBC: 5.09 x10E6/uL (ref 4.14–5.80)
RDW: 13.1 % (ref 11.6–15.4)
WBC: 6.2 10*3/uL (ref 3.4–10.8)

## 2023-04-10 DIAGNOSIS — G4733 Obstructive sleep apnea (adult) (pediatric): Secondary | ICD-10-CM | POA: Diagnosis not present

## 2023-04-15 ENCOUNTER — Other Ambulatory Visit: Payer: Self-pay | Admitting: Urology

## 2023-04-21 DIAGNOSIS — H401132 Primary open-angle glaucoma, bilateral, moderate stage: Secondary | ICD-10-CM | POA: Diagnosis not present

## 2023-05-10 DIAGNOSIS — G4733 Obstructive sleep apnea (adult) (pediatric): Secondary | ICD-10-CM | POA: Diagnosis not present

## 2023-06-10 DIAGNOSIS — G4733 Obstructive sleep apnea (adult) (pediatric): Secondary | ICD-10-CM | POA: Diagnosis not present

## 2023-06-17 ENCOUNTER — Encounter: Payer: Self-pay | Admitting: Internal Medicine

## 2023-06-24 ENCOUNTER — Other Ambulatory Visit (HOSPITAL_COMMUNITY): Payer: Self-pay | Admitting: Family Medicine

## 2023-06-24 ENCOUNTER — Encounter (HOSPITAL_COMMUNITY): Payer: Self-pay | Admitting: Family Medicine

## 2023-06-24 DIAGNOSIS — I10 Essential (primary) hypertension: Secondary | ICD-10-CM | POA: Diagnosis not present

## 2023-06-24 DIAGNOSIS — E291 Testicular hypofunction: Secondary | ICD-10-CM | POA: Diagnosis not present

## 2023-06-24 DIAGNOSIS — Z0001 Encounter for general adult medical examination with abnormal findings: Secondary | ICD-10-CM | POA: Diagnosis not present

## 2023-06-24 DIAGNOSIS — N138 Other obstructive and reflux uropathy: Secondary | ICD-10-CM | POA: Diagnosis not present

## 2023-06-24 DIAGNOSIS — F17201 Nicotine dependence, unspecified, in remission: Secondary | ICD-10-CM | POA: Diagnosis not present

## 2023-06-24 DIAGNOSIS — M25551 Pain in right hip: Secondary | ICD-10-CM | POA: Diagnosis not present

## 2023-06-24 DIAGNOSIS — E782 Mixed hyperlipidemia: Secondary | ICD-10-CM | POA: Diagnosis not present

## 2023-06-24 DIAGNOSIS — G4733 Obstructive sleep apnea (adult) (pediatric): Secondary | ICD-10-CM | POA: Diagnosis not present

## 2023-06-24 DIAGNOSIS — K7460 Unspecified cirrhosis of liver: Secondary | ICD-10-CM | POA: Diagnosis not present

## 2023-06-24 DIAGNOSIS — E1159 Type 2 diabetes mellitus with other circulatory complications: Secondary | ICD-10-CM | POA: Diagnosis not present

## 2023-07-02 ENCOUNTER — Encounter: Payer: Self-pay | Admitting: *Deleted

## 2023-07-02 ENCOUNTER — Telehealth: Payer: Self-pay | Admitting: *Deleted

## 2023-07-02 ENCOUNTER — Other Ambulatory Visit: Payer: Self-pay | Admitting: *Deleted

## 2023-07-02 DIAGNOSIS — K7460 Unspecified cirrhosis of liver: Secondary | ICD-10-CM

## 2023-07-02 NOTE — Telephone Encounter (Signed)
Pt left vm stating he received a letter says he needed to have a RUQ [place].  [place] scheduled for 07/14/23, arrive at 8:15 am, NPO after midnight.  Message sent via MyChart with appointment date, time and instructions.

## 2023-07-07 DIAGNOSIS — M2569 Stiffness of other specified joint, not elsewhere classified: Secondary | ICD-10-CM | POA: Diagnosis not present

## 2023-07-07 DIAGNOSIS — Z7409 Other reduced mobility: Secondary | ICD-10-CM | POA: Diagnosis not present

## 2023-07-07 DIAGNOSIS — M5416 Radiculopathy, lumbar region: Secondary | ICD-10-CM | POA: Diagnosis not present

## 2023-07-11 DIAGNOSIS — G4733 Obstructive sleep apnea (adult) (pediatric): Secondary | ICD-10-CM | POA: Diagnosis not present

## 2023-07-14 ENCOUNTER — Encounter: Payer: Self-pay | Admitting: Gastroenterology

## 2023-07-14 ENCOUNTER — Ambulatory Visit (HOSPITAL_COMMUNITY)
Admission: RE | Admit: 2023-07-14 | Discharge: 2023-07-14 | Disposition: A | Discharge status: Home / Self Care | Payer: Medicare PPO | Source: Ambulatory Visit | Attending: Gastroenterology | Admitting: Gastroenterology

## 2023-07-14 DIAGNOSIS — Z7409 Other reduced mobility: Secondary | ICD-10-CM | POA: Diagnosis not present

## 2023-07-14 DIAGNOSIS — N200 Calculus of kidney: Secondary | ICD-10-CM | POA: Diagnosis not present

## 2023-07-14 DIAGNOSIS — K7460 Unspecified cirrhosis of liver: Secondary | ICD-10-CM | POA: Diagnosis not present

## 2023-07-14 DIAGNOSIS — M5416 Radiculopathy, lumbar region: Secondary | ICD-10-CM | POA: Diagnosis not present

## 2023-07-14 DIAGNOSIS — K8020 Calculus of gallbladder without cholecystitis without obstruction: Secondary | ICD-10-CM | POA: Diagnosis not present

## 2023-07-14 DIAGNOSIS — M2569 Stiffness of other specified joint, not elsewhere classified: Secondary | ICD-10-CM | POA: Diagnosis not present

## 2023-07-20 DIAGNOSIS — Z7409 Other reduced mobility: Secondary | ICD-10-CM | POA: Diagnosis not present

## 2023-07-20 DIAGNOSIS — M2569 Stiffness of other specified joint, not elsewhere classified: Secondary | ICD-10-CM | POA: Diagnosis not present

## 2023-07-20 DIAGNOSIS — M5416 Radiculopathy, lumbar region: Secondary | ICD-10-CM | POA: Diagnosis not present

## 2023-07-22 DIAGNOSIS — Z7409 Other reduced mobility: Secondary | ICD-10-CM | POA: Diagnosis not present

## 2023-07-22 DIAGNOSIS — M2569 Stiffness of other specified joint, not elsewhere classified: Secondary | ICD-10-CM | POA: Diagnosis not present

## 2023-07-22 DIAGNOSIS — M5416 Radiculopathy, lumbar region: Secondary | ICD-10-CM | POA: Diagnosis not present

## 2023-07-27 DIAGNOSIS — M2569 Stiffness of other specified joint, not elsewhere classified: Secondary | ICD-10-CM | POA: Diagnosis not present

## 2023-07-27 DIAGNOSIS — Z7409 Other reduced mobility: Secondary | ICD-10-CM | POA: Diagnosis not present

## 2023-07-27 DIAGNOSIS — M5416 Radiculopathy, lumbar region: Secondary | ICD-10-CM | POA: Diagnosis not present

## 2023-07-29 DIAGNOSIS — M2569 Stiffness of other specified joint, not elsewhere classified: Secondary | ICD-10-CM | POA: Diagnosis not present

## 2023-07-29 DIAGNOSIS — Z7409 Other reduced mobility: Secondary | ICD-10-CM | POA: Diagnosis not present

## 2023-07-29 DIAGNOSIS — M5416 Radiculopathy, lumbar region: Secondary | ICD-10-CM | POA: Diagnosis not present

## 2023-08-03 DIAGNOSIS — Z7409 Other reduced mobility: Secondary | ICD-10-CM | POA: Diagnosis not present

## 2023-08-03 DIAGNOSIS — M5416 Radiculopathy, lumbar region: Secondary | ICD-10-CM | POA: Diagnosis not present

## 2023-08-03 DIAGNOSIS — M2569 Stiffness of other specified joint, not elsewhere classified: Secondary | ICD-10-CM | POA: Diagnosis not present

## 2023-08-04 ENCOUNTER — Ambulatory Visit (HOSPITAL_COMMUNITY): Admission: RE | Admit: 2023-08-04 | Payer: Medicare PPO | Source: Ambulatory Visit

## 2023-08-04 ENCOUNTER — Encounter (HOSPITAL_COMMUNITY): Payer: Self-pay

## 2023-08-05 DIAGNOSIS — M5416 Radiculopathy, lumbar region: Secondary | ICD-10-CM | POA: Diagnosis not present

## 2023-08-05 DIAGNOSIS — M2569 Stiffness of other specified joint, not elsewhere classified: Secondary | ICD-10-CM | POA: Diagnosis not present

## 2023-08-05 DIAGNOSIS — Z7409 Other reduced mobility: Secondary | ICD-10-CM | POA: Diagnosis not present

## 2023-08-10 DIAGNOSIS — M2569 Stiffness of other specified joint, not elsewhere classified: Secondary | ICD-10-CM | POA: Diagnosis not present

## 2023-08-10 DIAGNOSIS — G4733 Obstructive sleep apnea (adult) (pediatric): Secondary | ICD-10-CM | POA: Diagnosis not present

## 2023-08-10 DIAGNOSIS — M5416 Radiculopathy, lumbar region: Secondary | ICD-10-CM | POA: Diagnosis not present

## 2023-08-10 DIAGNOSIS — Z7409 Other reduced mobility: Secondary | ICD-10-CM | POA: Diagnosis not present

## 2023-08-13 DIAGNOSIS — M5416 Radiculopathy, lumbar region: Secondary | ICD-10-CM | POA: Diagnosis not present

## 2023-08-13 DIAGNOSIS — M2569 Stiffness of other specified joint, not elsewhere classified: Secondary | ICD-10-CM | POA: Diagnosis not present

## 2023-08-13 DIAGNOSIS — Z7409 Other reduced mobility: Secondary | ICD-10-CM | POA: Diagnosis not present

## 2023-09-10 DIAGNOSIS — G4733 Obstructive sleep apnea (adult) (pediatric): Secondary | ICD-10-CM | POA: Diagnosis not present

## 2023-09-24 DIAGNOSIS — N200 Calculus of kidney: Secondary | ICD-10-CM | POA: Diagnosis not present

## 2023-09-24 DIAGNOSIS — R3912 Poor urinary stream: Secondary | ICD-10-CM | POA: Diagnosis not present

## 2023-09-24 DIAGNOSIS — N401 Enlarged prostate with lower urinary tract symptoms: Secondary | ICD-10-CM | POA: Diagnosis not present

## 2023-10-10 DIAGNOSIS — G4733 Obstructive sleep apnea (adult) (pediatric): Secondary | ICD-10-CM | POA: Diagnosis not present

## 2023-10-19 DIAGNOSIS — H2513 Age-related nuclear cataract, bilateral: Secondary | ICD-10-CM | POA: Diagnosis not present

## 2023-10-19 DIAGNOSIS — H18593 Other hereditary corneal dystrophies, bilateral: Secondary | ICD-10-CM | POA: Diagnosis not present

## 2023-10-19 DIAGNOSIS — H5203 Hypermetropia, bilateral: Secondary | ICD-10-CM | POA: Diagnosis not present

## 2023-10-19 DIAGNOSIS — Z48810 Encounter for surgical aftercare following surgery on the sense organs: Secondary | ICD-10-CM | POA: Diagnosis not present

## 2023-10-19 DIAGNOSIS — H401133 Primary open-angle glaucoma, bilateral, severe stage: Secondary | ICD-10-CM | POA: Diagnosis not present

## 2023-10-19 DIAGNOSIS — H04123 Dry eye syndrome of bilateral lacrimal glands: Secondary | ICD-10-CM | POA: Diagnosis not present

## 2023-11-04 IMAGING — US US ABDOMEN LIMITED
1 series · 14 of 25 positions shown · non-contrast
Comparison: 06/13/2021

CLINICAL DATA: Cirrhosis

EXAM:
ULTRASOUND ABDOMEN LIMITED RIGHT UPPER QUADRANT

[Series 1: us abdomen limited ruq (liver/gb) · 14 of 58 slices shown]
[im 1/58]
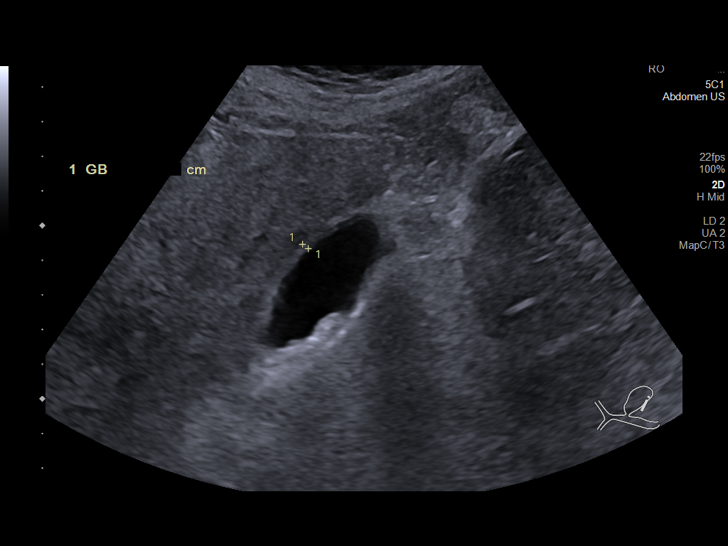
[im 5/58]
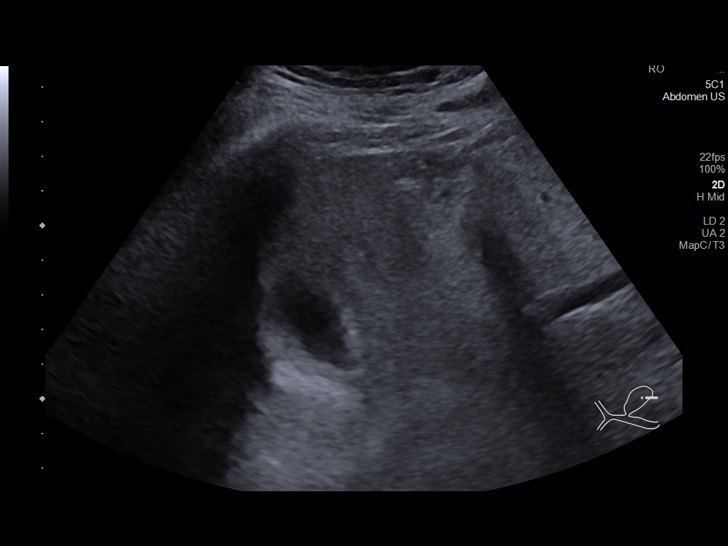
[im 10/58]
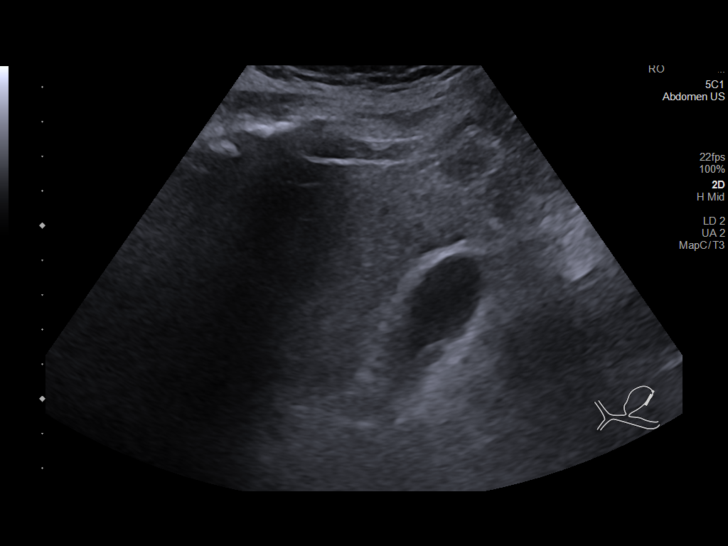
[im 15/58]
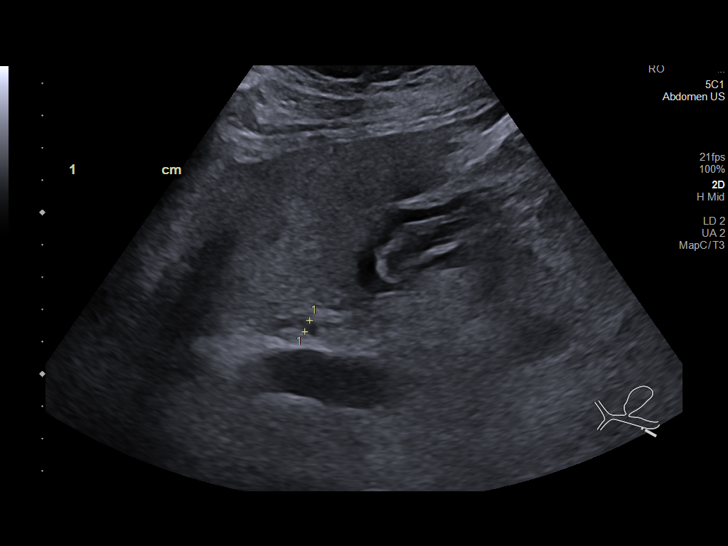
[im 20/58]
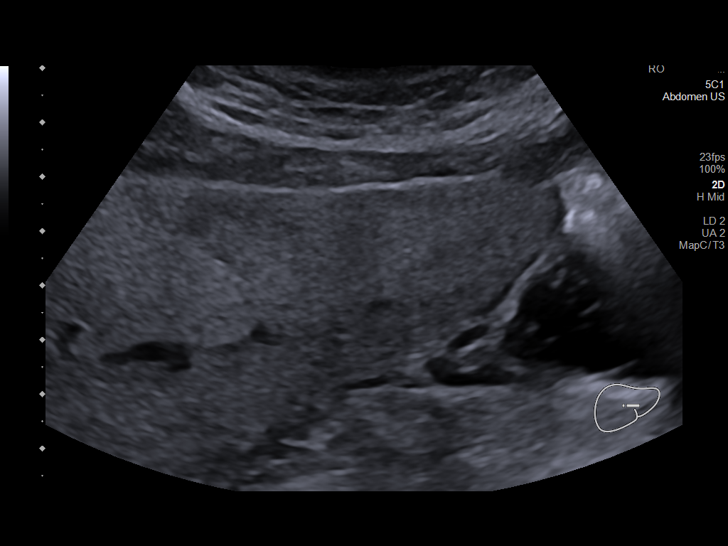
[im 22/58]
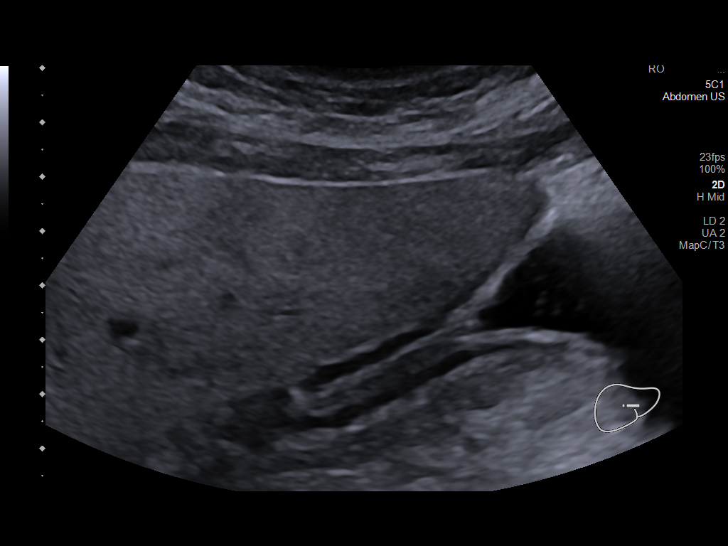
[im 27/58]
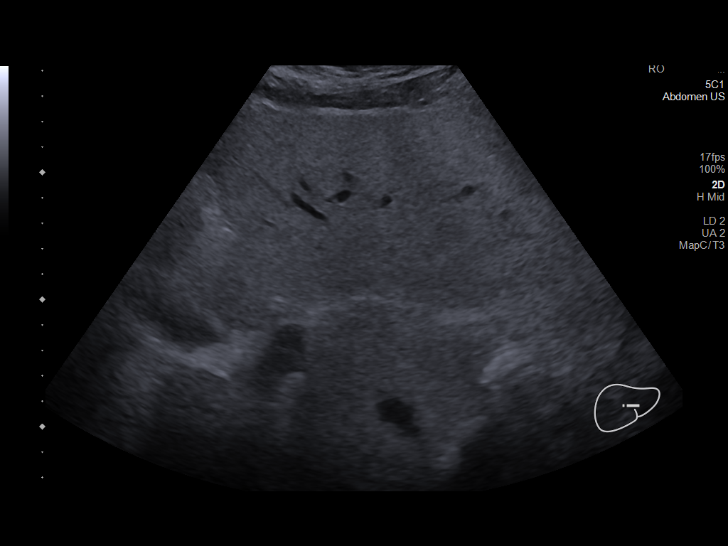
[im 31/58]
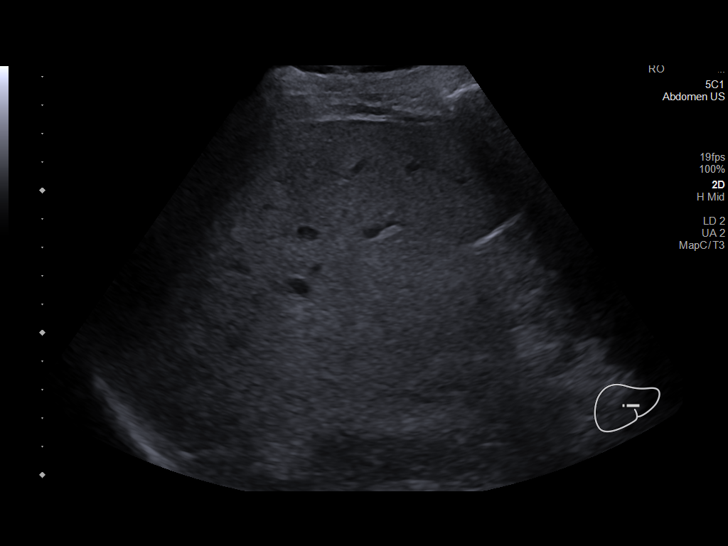
[im 36/58]
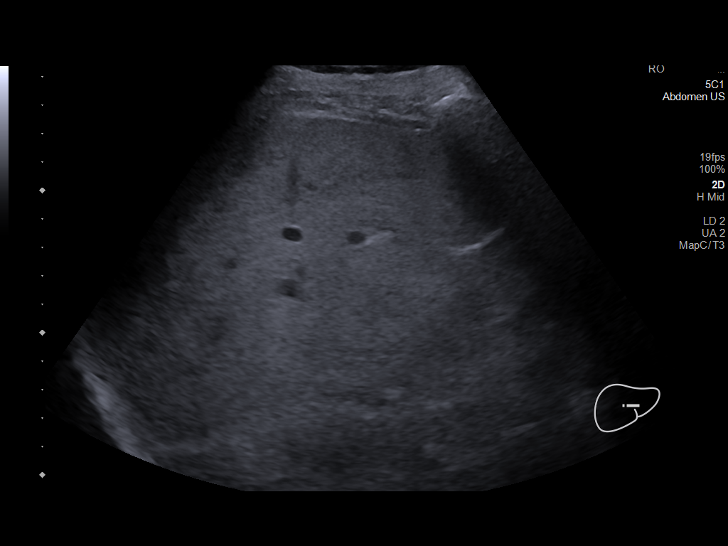
[im 39/58]
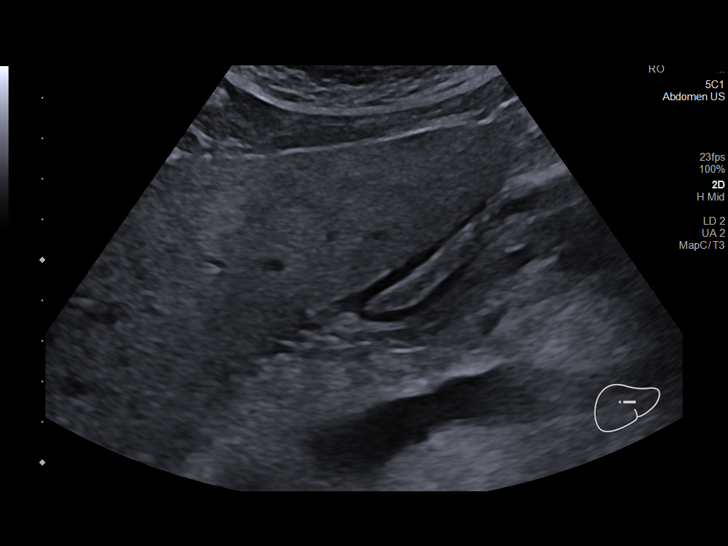
[im 43/58]
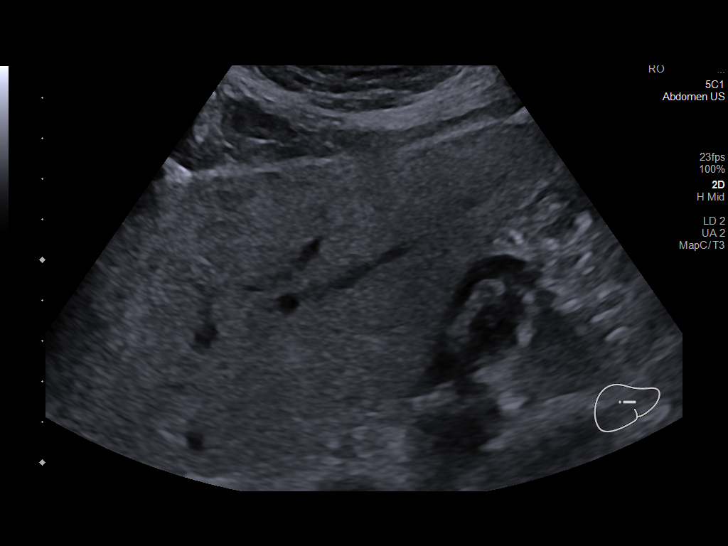
[im 48/58]
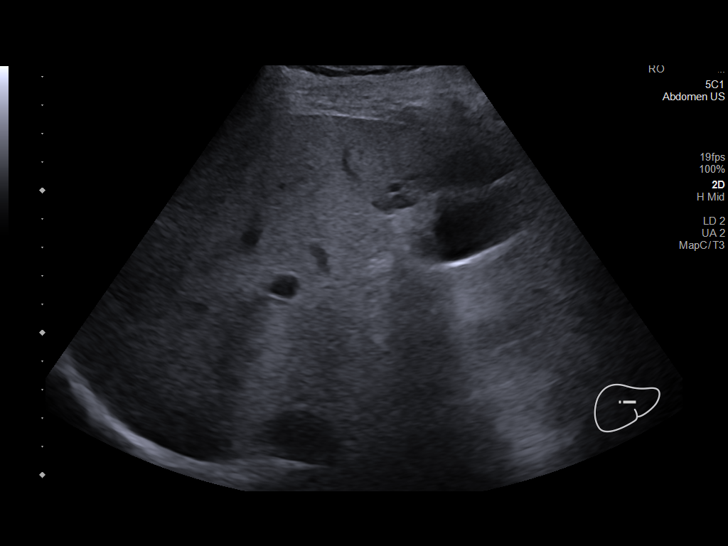
[im 53/58]
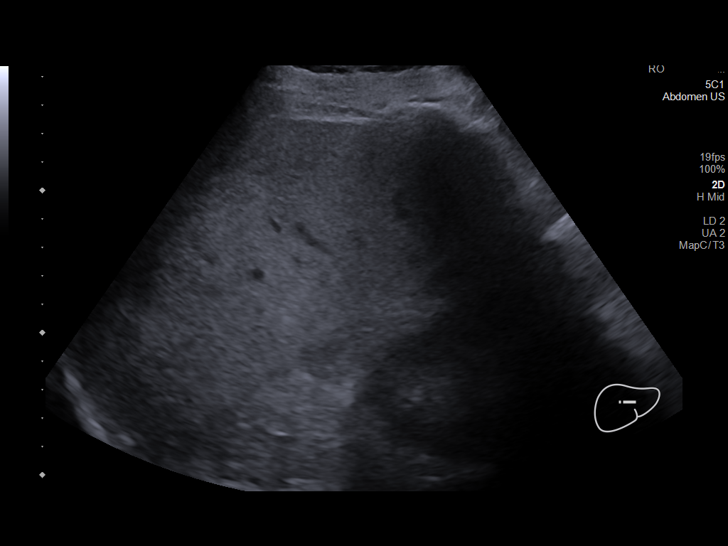
[im 58/58]
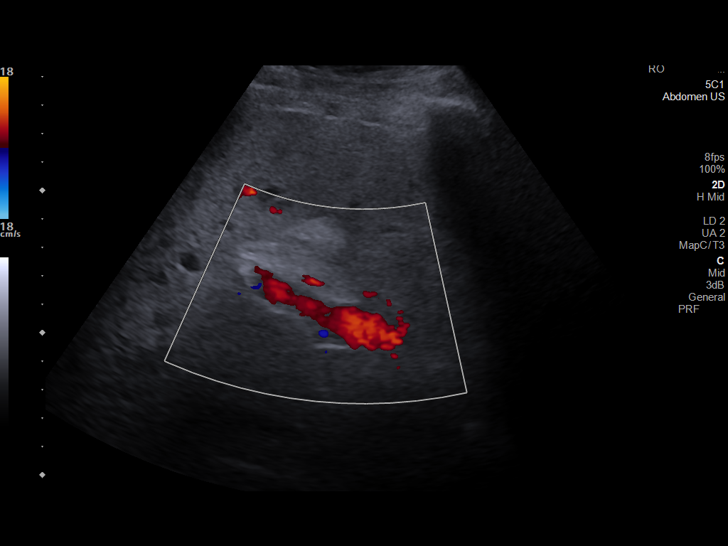

[14 of 25 positions shown; findings below may reference images not displayed]

FINDINGS: Gallbladder:

There are few gallbladder stones. There are no sonographic signs of
acute cholecystitis.

Common bile duct:

Diameter: 3.8 mm.

Liver:

There is increased echogenicity in the liver. No focal abnormality
is seen. Portal vein is patent on color Doppler imaging with normal
direction of blood flow towards the liver.

Other: None.
IMPRESSION: Gallbladder stones.  There are no signs of acute cholecystitis.

Fatty liver.  No focal abnormality is seen in the liver.

## 2023-11-10 DIAGNOSIS — G4733 Obstructive sleep apnea (adult) (pediatric): Secondary | ICD-10-CM | POA: Diagnosis not present

## 2023-12-11 DIAGNOSIS — G4733 Obstructive sleep apnea (adult) (pediatric): Secondary | ICD-10-CM | POA: Diagnosis not present

## 2023-12-16 ENCOUNTER — Encounter: Payer: Self-pay | Admitting: Internal Medicine

## 2023-12-21 DIAGNOSIS — H2513 Age-related nuclear cataract, bilateral: Secondary | ICD-10-CM | POA: Diagnosis not present

## 2023-12-21 DIAGNOSIS — H18593 Other hereditary corneal dystrophies, bilateral: Secondary | ICD-10-CM | POA: Diagnosis not present

## 2023-12-21 DIAGNOSIS — H401133 Primary open-angle glaucoma, bilateral, severe stage: Secondary | ICD-10-CM | POA: Diagnosis not present

## 2023-12-21 DIAGNOSIS — Z48810 Encounter for surgical aftercare following surgery on the sense organs: Secondary | ICD-10-CM | POA: Diagnosis not present

## 2024-02-03 ENCOUNTER — Ambulatory Visit: Admitting: Gastroenterology

## 2024-02-03 ENCOUNTER — Encounter: Payer: Self-pay | Admitting: Gastroenterology

## 2024-02-03 VITALS — BP 122/68 | HR 64 | Temp 97.6°F | Ht 68.0 in | Wt 236.8 lb

## 2024-02-03 DIAGNOSIS — K7400 Hepatic fibrosis, unspecified: Secondary | ICD-10-CM

## 2024-02-03 DIAGNOSIS — K7402 Hepatic fibrosis, advanced fibrosis: Secondary | ICD-10-CM

## 2024-02-03 NOTE — Patient Instructions (Signed)
 We are arranging an endoscopy with Dr. Jena Gauss in near future for routine screening purposes.   You will need to hold ozempic one week prior to the endoscopy, and hold Jardiance 72 hours prior.   I have ordered blood work to be done at [facility], and we will arrange an ultrasound in the near future!  It was great to see you again! We will see you in 6 months!  I enjoyed seeing you again today! I value our relationship and want to provide genuine, compassionate, and quality care. You may receive a survey regarding your visit with me, and I welcome your feedback! Thanks so much for taking the time to complete this. I look forward to seeing you again.      Gelene Mink, PhD, ANP-BC [facility] Gastroenterology

## 2024-02-03 NOTE — Progress Notes (Signed)
 Gastroenterology Office Note     Primary Care Physician:  Ladon Applebaum  Primary Gastroenterologist: Dr. Jena Gauss    Chief Complaint   Chief Complaint  Patient presents with   Follow-up     History of Present Illness   Eric Dougherty is a 73 y.o. male presenting today with a history of  advanced fibrosis, possible underlying cirrhosis likely due to hepatic steatosis and prior alcohol.  Hep B serologies negative, Hep C negative, completed Hep B vaccinations. [place] has been favoring hepatic steatosis although elastography with F2/F3 in the past. 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 To be cautious, he is enrolled in cirrhosis care.   No abdominal pain, N/V, changes in bowel habits, constipation, diarrhea, overt GI bleeding, GERD, dysphagia, unexplained weight loss, lack of appetite, unexplained weight gain. No jaundice or pruritus. No lower extremity edema or ascites. No mental status changes or confusion.  We briefly discussed Rezdiffra (if able to rule out cirrhosis). He would like to hold off on this and continue with proactive screenings and following.     colonoscopy normal in March 2022. 5 year surveillance recommended. EGD also completed March 2022 with normal esophagus, portal gastropathy that was mild, normal duodenum  Past Medical History:  Diagnosis Date   Cirrhosis (HCC)    Diabetes mellitus    resolution of symptoms   Hypertension    no longer on meds   PONV (postoperative nausea and vomiting)    Sciatica     Past Surgical History:  Procedure Laterality Date   BIOPSY  11/09/2018   Procedure: BIOPSY;  Surgeon: Corbin Ade, MD;  Location: [facility];  Service: Endoscopy;;  gastric    COLONOSCOPY  11/20/2005   RMR: Minimal hemorrhoids, otherwise normal   COLONOSCOPY N/A 03/25/2016   three semi-pedunculated polyps 4-6 mm in size, multiple medium-mouthed sigmoid diverticula (hyperplastic).     COLONOSCOPY WITH PROPOFOL N/A 01/02/2021   normal   ESOPHAGOGASTRODUODENOSCOPY N/A 11/09/2018   11/09/2018 with erosive reflux esophagitis, erythematous mucosa in the stomach status post biopsy found to be mild chronic gastritis.   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   HEMORRHOID SURGERY     HERNIA REPAIR     LITHOTRIPSY     TONSILLECTOMY     TOOTH EXTRACTION      Current Outpatient Medications  Medication Sig Dispense Refill   aspirin EC 81 MG tablet Take 81 mg by mouth daily.     atorvastatin (LIPITOR) 40 MG tablet TAKE 1 TABLET ONCE DAILY. (Patient taking differently: Take 40 mg by mouth daily.) 90 tablet 3   Cholecalciferol (VITAMIN D3) 50 MCG (2000 UT) TABS Take 2,000 Units by mouth daily.     dorzolamide-timolol (COSOPT) 2-0.5 % ophthalmic solution Place 1 drop into both eyes 2 (two) times daily.     empagliflozin (JARDIANCE) 25 MG TABS tablet Take 25 mg by mouth daily.     famotidine (PEPCID) 20 MG tablet Take 20 mg by mouth daily.     folic acid (FOLVITE) 1 MG tablet Take 1,000 mcg by mouth daily.     Ibuprofen-Acetaminophen (ADVIL DUAL ACTION) 125-250 MG TABS Take 1 tablet by mouth daily as needed (Pain).     latanoprost (XALATAN) 0.005 % ophthalmic solution Place 1 drop into both eyes at bedtime.     lisinopril (ZESTRIL) 10 MG tablet Take 10 mg by  mouth daily.     naproxen sodium (ALEVE) 220 MG tablet Take 440 mg by mouth daily as needed (pain).     OZEMPIC, 0.25 OR 0.5 MG/DOSE, 2 MG/3ML SOPN Inject 2 mg into the skin once a week.     tadalafil (CIALIS) 5 MG tablet Take 5 mg by mouth daily.     tamsulosin (FLOMAX) 0.4 MG CAPS capsule Take 0.4 mg by mouth daily.      thiamine (VITAMIN B-1) 100 MG tablet Take 100 mg by mouth daily.     vitamin C (ASCORBIC ACID) 500 MG tablet Take 500 mg by mouth daily.     zinc gluconate 50 MG tablet Take 50 mg by mouth daily.     No current  facility-administered medications for this visit.    Allergies as of 02/03/2024   (No Known Allergies)    Family History  Problem Relation Age of Onset   Leukemia Father    Hypothyroidism Brother    Glaucoma Brother    Rectal cancer Brother 39       s/p surgical resection; dx 2 years ago   Osteoarthritis Other    Prostate cancer Paternal Grandfather    Cirrhosis Cousin     Social History   Socioeconomic History   Marital status: Married    Spouse name: Not on file   Number of children: Not on file   Years of education: Not on file   Highest education level: Not on file  Occupational History   Not on file  Tobacco Use   Smoking status: Former   Smokeless tobacco: Former    Types: Chew    Quit date: 11/02/1978  Vaping Use   Vaping status: Never Used  Substance and Sexual Activity   Alcohol use: Not Currently    Alcohol/week: 7.0 standard drinks of alcohol    Types: 7 Standard drinks or equivalent per week    Comment: 2-3 at a time; 2-3 times a week; previously: none in 2019 but did have 1 drink in June on vacation.   Drug use: No   Sexual activity: Not on file  Other Topics Concern   Not on file  Social History Narrative   ** Merged History Encounter **       Social Drivers of Health   Financial Resource Strain: Not on file  Food Insecurity: Not on file  Transportation Needs: Not on file  Physical Activity: Not on file  Stress: Not on file  Social Connections: Not on file  Intimate Partner Violence: Not on file     Review of Systems   Gen: Denies any fever, chills, fatigue, weight loss, lack of appetite.  CV: Denies chest pain, heart palpitations, peripheral edema, syncope.  Resp: Denies shortness of breath at rest or with exertion. Denies wheezing or cough.  GI: Denies dysphagia or odynophagia. Denies jaundice, hematemesis, fecal incontinence. GU : Denies urinary burning, urinary frequency, urinary hesitancy MS: Denies joint pain, muscle weakness,  cramps, or limitation of movement.  Derm: Denies rash, itching, dry skin Psych: Denies depression, anxiety, memory loss, and confusion Heme: Denies bruising, bleeding, and enlarged lymph nodes.   Physical Exam   BP 122/68 (BP Location: Right Arm, Patient Position: Sitting, Cuff Size: Large)   Pulse 64   Temp 97.6 F (36.4 C) (Oral)   Ht 5\' 8"  (1.727 m)   Wt 236 lb 12.8 oz (107.4 kg)   SpO2 95%   BMI 36.01 kg/m  General:   Alert and oriented. Pleasant  and cooperative. Well-nourished and well-developed.  Head:  Normocephalic and atraumatic. Eyes:  Without icterus Abdomen:  +BS, soft, non-tender and non-distended. No HSM noted. No guarding or rebound. Ventral hernia vs rectus diastasis. Small UH Rectal:  Deferred  Msk:  Symmetrical without gross deformities. Normal posture. Extremities:  Without edema. Neurologic:  Alert and  oriented x4;  grossly normal neurologically. Skin:  Intact without significant lesions or rashes. Psych:  Alert and cooperative. Normal mood and affect.   Assessment   Eric Dougherty is a 73 y.o. male presenting today with a history of advanced fibrosis, unable to exclude underlying early cirrhosis likely due to hepatic steatosis and prior alcohol.    Liver fibrosis: F2/F3 in the past with good data quality. We have been following proactively due to risk of progression to cirrhosis in light of F3. He also does have chronic mild thrombocytopenia, and portal gastropathy on EGD in 2022. We will perform screening in near future. I did discuss with him potential other evaluation such as fibroscan, but he would like to hold off on this and defer further extensive work-up for any candidacy for Rezdiffra. He is very well-compensated. Update [place], MELD Labs, AFP now. Return in 6 months. He has had vaccinations.   Colonoscopy due in 2027.    PLAN  Proceed with upper endoscopy for screening varices by Dr. Jena Gauss in near future: the risks, benefits, and alternatives have  been discussed with the patient in detail. The patient states understanding and desires to proceed.  MELD Labs, AFP, US abdomen complete Avoid ETOH Continue diet/lifestyle changes 6 month follow-up Colonoscopy in 2027   Gelene Mink, PhD, ANP-BC [facility]

## 2024-02-10 LAB — CBC WITH DIFFERENTIAL/PLATELET
Basophils Absolute: 0.1 10*3/uL (ref 0.0–0.2)
Basos: 1 %
EOS (ABSOLUTE): 0.2 10*3/uL (ref 0.0–0.4)
Eos: 3 %
Hematocrit: 48.5 % (ref 37.5–51.0)
Hemoglobin: 16.2 g/dL (ref 13.0–17.7)
Immature Grans (Abs): 0 10*3/uL (ref 0.0–0.1)
Immature Granulocytes: 0 %
Lymphocytes Absolute: 1.2 10*3/uL (ref 0.7–3.1)
Lymphs: 19 %
MCH: 31.1 pg (ref 26.6–33.0)
MCHC: 33.4 g/dL (ref 31.5–35.7)
MCV: 93 fL (ref 79–97)
Monocytes Absolute: 0.5 10*3/uL (ref 0.1–0.9)
Monocytes: 8 %
Neutrophils Absolute: 4.6 10*3/uL (ref 1.4–7.0)
Neutrophils: 69 %
Platelets: 138 10*3/uL — ABNORMAL LOW (ref 150–450)
RBC: 5.21 x10E6/uL (ref 4.14–5.80)
RDW: 12.8 % (ref 11.6–15.4)
WBC: 6.6 10*3/uL (ref 3.4–10.8)

## 2024-02-10 LAB — COMPREHENSIVE METABOLIC PANEL WITH GFR
ALT: 23 IU/L (ref 0–44)
AST: 20 IU/L (ref 0–40)
Albumin: 4.4 g/dL (ref 3.8–4.8)
Alkaline Phosphatase: 83 IU/L (ref 44–121)
BUN/Creatinine Ratio: 15 (ref 10–24)
BUN: 11 mg/dL (ref 8–27)
Bilirubin Total: 0.6 mg/dL (ref 0.0–1.2)
CO2: 23 mmol/L (ref 20–29)
Calcium: 9.3 mg/dL (ref 8.6–10.2)
Chloride: 104 mmol/L (ref 96–106)
Creatinine, Ser: 0.75 mg/dL — ABNORMAL LOW (ref 0.76–1.27)
Globulin, Total: 2.6 g/dL (ref 1.5–4.5)
Glucose: 112 mg/dL — ABNORMAL HIGH (ref 70–99)
Potassium: 4.3 mmol/L (ref 3.5–5.2)
Sodium: 140 mmol/L (ref 134–144)
Total Protein: 7 g/dL (ref 6.0–8.5)
eGFR: 95 mL/min/{1.73_m2} (ref >59)

## 2024-02-10 LAB — ENHANCED LIVER FIBROSIS (ELF): ELF(TM) Score: 9.47 (ref <9.80)

## 2024-02-10 LAB — AFP TUMOR MARKER: AFP, Serum, Tumor Marker: <1.8 ng/mL (ref 0.0–8.4)

## 2024-02-10 LAB — PROTIME-INR
INR: 1 (ref 0.9–1.2)
Prothrombin Time: 11.7 s (ref 9.1–12.0)

## 2024-02-11 ENCOUNTER — Ambulatory Visit (HOSPITAL_COMMUNITY)
Admission: RE | Admit: 2024-02-11 | Discharge: 2024-02-11 | Disposition: A | Discharge status: Home / Self Care | Source: Ambulatory Visit | Attending: Gastroenterology | Admitting: Gastroenterology

## 2024-02-11 DIAGNOSIS — K7400 Hepatic fibrosis, unspecified: Secondary | ICD-10-CM | POA: Insufficient documentation

## 2024-02-11 DIAGNOSIS — N200 Calculus of kidney: Secondary | ICD-10-CM | POA: Diagnosis not present

## 2024-02-11 DIAGNOSIS — K8020 Calculus of gallbladder without cholecystitis without obstruction: Secondary | ICD-10-CM | POA: Diagnosis not present

## 2024-02-11 DIAGNOSIS — K7689 Other specified diseases of liver: Secondary | ICD-10-CM | POA: Diagnosis not present

## 2024-02-17 DIAGNOSIS — E782 Mixed hyperlipidemia: Secondary | ICD-10-CM | POA: Diagnosis not present

## 2024-02-17 DIAGNOSIS — I10 Essential (primary) hypertension: Secondary | ICD-10-CM | POA: Diagnosis not present

## 2024-02-17 DIAGNOSIS — E6609 Other obesity due to excess calories: Secondary | ICD-10-CM | POA: Diagnosis not present

## 2024-02-17 DIAGNOSIS — K7460 Unspecified cirrhosis of liver: Secondary | ICD-10-CM | POA: Diagnosis not present

## 2024-02-17 DIAGNOSIS — E1159 Type 2 diabetes mellitus with other circulatory complications: Secondary | ICD-10-CM | POA: Diagnosis not present

## 2024-02-17 DIAGNOSIS — R972 Elevated prostate specific antigen [PSA]: Secondary | ICD-10-CM | POA: Diagnosis not present

## 2024-02-17 DIAGNOSIS — Z6834 Body mass index (BMI) 34.0-34.9, adult: Secondary | ICD-10-CM | POA: Diagnosis not present

## 2024-02-17 DIAGNOSIS — E7849 Other hyperlipidemia: Secondary | ICD-10-CM | POA: Diagnosis not present

## 2024-03-07 ENCOUNTER — Telehealth: Payer: Self-pay | Admitting: *Deleted

## 2024-03-07 ENCOUNTER — Encounter: Payer: Self-pay | Admitting: *Deleted

## 2024-03-07 NOTE — Telephone Encounter (Signed)
 [employer] PAApproved Authorization #[id number]  Tracking #[id number] Dates of service 04/26/2024 - 07/26/2024

## 2024-03-08 ENCOUNTER — Encounter: Payer: Self-pay | Admitting: *Deleted

## 2024-03-15 DIAGNOSIS — H2513 Age-related nuclear cataract, bilateral: Secondary | ICD-10-CM | POA: Diagnosis not present

## 2024-03-15 DIAGNOSIS — H401133 Primary open-angle glaucoma, bilateral, severe stage: Secondary | ICD-10-CM | POA: Diagnosis not present

## 2024-03-15 DIAGNOSIS — H04123 Dry eye syndrome of bilateral lacrimal glands: Secondary | ICD-10-CM | POA: Diagnosis not present

## 2024-03-15 DIAGNOSIS — H18593 Other hereditary corneal dystrophies, bilateral: Secondary | ICD-10-CM | POA: Diagnosis not present

## 2024-04-10 DIAGNOSIS — T63304A Toxic effect of unspecified spider venom, undetermined, initial encounter: Secondary | ICD-10-CM | POA: Diagnosis not present

## 2024-04-10 DIAGNOSIS — L03221 Cellulitis of neck: Secondary | ICD-10-CM | POA: Diagnosis not present

## 2024-04-21 NOTE — Patient Instructions (Signed)
 Eric Dougherty  04/21/2024     @PREFPERIOPPHARMACY @   Your procedure is scheduled on 04/26/2024.   Report to [facility] at  0900 A.M.   Call this number if you have problems the morning of surgery:  [phone]  If you experience any cold or flu symptoms such as cough, fever, chills, shortness of breath, etc. between now and your scheduled surgery, please notify us  at the above number.   Remember:        Your last dose of ozempic should have been on 04/18/2024.        Your last dose of jardiance should be on 04/22/2024.   Follow the diet instructions given to you by the office.   You may drink clear liquids until 0700 am on 04/26/2024.    Clear liquids allowed are:                    Water , Juice (No red color; non-citric and without pulp; diabetics please choose diet or no sugar options), Carbonated beverages (diabetics please choose diet or no sugar options), Clear Tea (No creamer, milk, or cream, including half & half and powdered creamer), Black Coffee Only (No creamer, milk or cream, including half & half and powdered creamer), and Clear Sports drink (No red color; diabetics please choose diet or no sugar options)    Take these medicines the morning of surgery with A SIP OF WATER                                                famotidine.    Do not wear jewelry, make-up or nail polish, including gel polish,  artificial nails, or any other type of covering on natural nails (fingers and  toes).  Do not wear lotions, powders, or perfumes, or deodorant.  Do not shave 48 hours prior to surgery.  Men may shave face and neck.  Do not bring valuables to the hospital.  [facility] is not responsible for any belongings or valuables.  Contacts, dentures or bridgework may not be worn into surgery.  Leave your suitcase in the car.  After surgery it may be brought to your room.  For patients admitted to the hospital, discharge time will be determined by your treatment  team.  Patients discharged the day of surgery will not be allowed to drive home and must have someone with them for 24 hours.    Special instructions:   DO NOT smoke tobacco or vape for 24 hours before your procedure.  Please read over the following fact sheets that you were given. Anesthesia Post-op Instructions and Care and Recovery After Surgery      Upper Endoscopy, Adult, Care After After the procedure, it is common to have a sore throat. It is also common to have: Mild stomach pain or discomfort. Bloating. Nausea. Follow these instructions at home: The instructions below may help you care for yourself at home. Your health care provider may give you more instructions. If you have questions, ask your health care provider. If you were given a sedative during the procedure, it can affect you for several hours. Do not drive or operate machinery until your health care provider says that it is safe. If you will be going home right after the procedure, plan to have a responsible adult: Take you  home from the hospital or clinic. You will not be allowed to drive. Care for you for the time you are told. Follow instructions from your health care provider about what you may eat and drink. Return to your normal activities as told by your health care provider. Ask your health care provider what activities are safe for you. Take over-the-counter and prescription medicines only as told by your health care provider. Contact a health care provider if you: Have a sore throat that lasts longer than one day. Have trouble swallowing. Have a fever. Get help right away if you: Vomit blood or your vomit looks like coffee grounds. Have bloody, black, or tarry stools. Have a very bad sore throat or you cannot swallow. Have difficulty breathing or very bad pain in your chest or abdomen. These symptoms may be an emergency. Get help right away. Call 911. Do not wait to see if the symptoms will go  away. Do not drive yourself to the hospital. Summary After the procedure, it is common to have a sore throat, mild stomach discomfort, bloating, and nausea. If you were given a sedative during the procedure, it can affect you for several hours. Do not drive until your health care provider says that it is safe. Follow instructions from your health care provider about what you may eat and drink. Return to your normal activities as told by your health care provider. This information is not intended to replace advice given to you by your health care provider. Make sure you discuss any questions you have with your health care provider. Document Revised: 01/28/2022 Document Reviewed: 01/28/2022 Elsevier Patient Education  2024 Elsevier Inc.General Anesthesia, Adult, Care After The following information offers guidance on how to care for yourself after your procedure. Your health care provider may also give you more specific instructions. If you have problems or questions, contact your health care provider. What can I expect after the procedure? After the procedure, it is common for people to: Have pain or discomfort at the IV site. Have nausea or vomiting. Have a sore throat or hoarseness. Have trouble concentrating. Feel cold or chills. Feel weak, sleepy, or tired (fatigue). Have soreness and body aches. These can affect parts of the body that were not involved in surgery. Follow these instructions at home: For the time period you were told by your health care provider:  Rest. Do not participate in activities where you could fall or become injured. Do not drive or use machinery. Do not drink alcohol. Do not take sleeping pills or medicines that cause drowsiness. Do not make important decisions or sign legal documents. Do not take care of children on your own. General instructions Drink enough fluid to keep your urine pale yellow. If you have sleep apnea, surgery and certain medicines can  increase your risk for breathing problems. Follow instructions from your health care provider about wearing your sleep device: Anytime you are sleeping, including during daytime naps. While taking prescription pain medicines, sleeping medicines, or medicines that make you drowsy. Return to your normal activities as told by your health care provider. Ask your health care provider what activities are safe for you. Take over-the-counter and prescription medicines only as told by your health care provider. Do not use any products that contain nicotine or tobacco. These products include cigarettes, chewing tobacco, and vaping devices, such as e-cigarettes. These can delay incision healing after surgery. If you need help quitting, ask your health care provider. Contact a health care provider if:  You have nausea or vomiting that does not get better with medicine. You vomit every time you eat or drink. You have pain that does not get better with medicine. You cannot urinate or have bloody urine. You develop a skin rash. You have a fever. Get help right away if: You have trouble breathing. You have chest pain. You vomit blood. These symptoms may be an emergency. Get help right away. Call 911. Do not wait to see if the symptoms will go away. Do not drive yourself to the hospital. Summary After the procedure, it is common to have a sore throat, hoarseness, nausea, vomiting, or to feel weak, sleepy, or fatigue. For the time period you were told by your health care provider, do not drive or use machinery. Get help right away if you have difficulty breathing, have chest pain, or vomit blood. These symptoms may be an emergency. This information is not intended to replace advice given to you by your health care provider. Make sure you discuss any questions you have with your health care provider. Document Revised: 01/16/2022 Document Reviewed: 01/16/2022 Elsevier Patient Education  2024 [employer].

## 2024-04-24 ENCOUNTER — Other Ambulatory Visit (HOSPITAL_COMMUNITY)

## 2024-04-24 ENCOUNTER — Encounter (HOSPITAL_COMMUNITY)
Admission: RE | Admit: 2024-04-24 | Discharge: 2024-04-24 | Disposition: A | Discharge status: Home / Self Care | Source: Ambulatory Visit | Attending: Internal Medicine | Admitting: Internal Medicine

## 2024-04-24 DIAGNOSIS — K766 Portal hypertension: Secondary | ICD-10-CM

## 2024-04-26 ENCOUNTER — Ambulatory Visit (HOSPITAL_COMMUNITY): Admitting: Certified Registered"

## 2024-04-26 ENCOUNTER — Encounter (HOSPITAL_COMMUNITY): Payer: Self-pay | Admitting: Internal Medicine

## 2024-04-26 ENCOUNTER — Encounter (HOSPITAL_COMMUNITY)
Admission: RE | Disposition: A | Discharge status: Home / Self Care | Payer: Self-pay | Source: Home / Self Care | Attending: Internal Medicine

## 2024-04-26 ENCOUNTER — Ambulatory Visit (HOSPITAL_COMMUNITY)
Admission: RE | Admit: 2024-04-26 | Discharge: 2024-04-26 | Disposition: A | Discharge status: Home / Self Care | Attending: Internal Medicine | Admitting: Internal Medicine

## 2024-04-26 DIAGNOSIS — Z7985 Long-term (current) use of injectable non-insulin antidiabetic drugs: Secondary | ICD-10-CM | POA: Diagnosis not present

## 2024-04-26 DIAGNOSIS — E119 Type 2 diabetes mellitus without complications: Secondary | ICD-10-CM | POA: Diagnosis not present

## 2024-04-26 DIAGNOSIS — Z7982 Long term (current) use of aspirin: Secondary | ICD-10-CM | POA: Diagnosis not present

## 2024-04-26 DIAGNOSIS — K7402 Hepatic fibrosis, advanced fibrosis: Secondary | ICD-10-CM | POA: Diagnosis not present

## 2024-04-26 DIAGNOSIS — Z87891 Personal history of nicotine dependence: Secondary | ICD-10-CM | POA: Insufficient documentation

## 2024-04-26 DIAGNOSIS — I10 Essential (primary) hypertension: Secondary | ICD-10-CM | POA: Insufficient documentation

## 2024-04-26 DIAGNOSIS — Z860100 Personal history of colon polyps, unspecified: Secondary | ICD-10-CM | POA: Insufficient documentation

## 2024-04-26 DIAGNOSIS — Z7984 Long term (current) use of oral hypoglycemic drugs: Secondary | ICD-10-CM | POA: Insufficient documentation

## 2024-04-26 DIAGNOSIS — K3189 Other diseases of stomach and duodenum: Secondary | ICD-10-CM | POA: Diagnosis not present

## 2024-04-26 DIAGNOSIS — K7460 Unspecified cirrhosis of liver: Secondary | ICD-10-CM

## 2024-04-26 DIAGNOSIS — R6889 Other general symptoms and signs: Secondary | ICD-10-CM | POA: Diagnosis not present

## 2024-04-26 DIAGNOSIS — K766 Portal hypertension: Secondary | ICD-10-CM | POA: Diagnosis not present

## 2024-04-26 DIAGNOSIS — Z79899 Other long term (current) drug therapy: Secondary | ICD-10-CM | POA: Insufficient documentation

## 2024-04-26 DIAGNOSIS — Z800 Family history of malignant neoplasm of digestive organs: Secondary | ICD-10-CM | POA: Insufficient documentation

## 2024-04-26 HISTORY — PX: ESOPHAGOGASTRODUODENOSCOPY: SHX5428

## 2024-04-26 LAB — GLUCOSE, CAPILLARY: Glucose-Capillary: 100 mg/dL — ABNORMAL HIGH (ref 70–99)

## 2024-04-26 SURGERY — EGD (ESOPHAGOGASTRODUODENOSCOPY)
Anesthesia: General

## 2024-04-26 MED ORDER — LACTATED RINGERS IV SOLN: INTRAVENOUS | Status: DC | PRN

## 2024-04-26 MED ORDER — LIDOCAINE 2% (20 MG/ML) 5 ML SYRINGE
INTRAMUSCULAR | Status: DC | PRN
  Administered 2024-04-26: 60 mg via INTRAVENOUS

## 2024-04-26 MED ORDER — PROPOFOL 500 MG/50ML IV EMUL
INTRAVENOUS | Status: DC | PRN
  Administered 2024-04-26: 60 mg via INTRAVENOUS
  Administered 2024-04-26: 125 ug/kg/min via INTRAVENOUS

## 2024-04-26 MED ORDER — ONDANSETRON HCL 4 MG/2ML IJ SOLN
INTRAMUSCULAR | Status: DC | PRN
  Administered 2024-04-26: 4 mg via INTRAVENOUS

## 2024-04-26 NOTE — Op Note (Signed)
 [facility] Patient Name: Eric Dougherty Procedure Date: 04/26/2024 10:19 AM MRN: [id number] Date of Birth: [birth year] Attending MD: Lamar Ozell Hollingshead , MD, [id number] CSN: [id number] Age: 73 Admit Type: Outpatient Procedure:                Upper GI endoscopy Indications:              Screening procedure, Cirrhosis rule out esophageal                            varices Providers:                Lamar Ozell Hollingshead, MD, Jon LABOR. Gerome RN, RN,                            Dorcas Lenis, Technician Referring MD:              Medicines:                Propofol  per Anesthesia Complications:            No immediate complications. Estimated Blood Loss:     Estimated blood loss was minimal. Procedure:                Pre-Anesthesia Assessment:                           - Prior to the procedure, a History and Physical                            was performed, and patient medications and                            allergies were reviewed. The patient's tolerance of                            previous anesthesia was also reviewed. The risks                            and benefits of the procedure and the sedation                            options and risks were discussed with the patient.                            All questions were answered, and informed consent                            was obtained. Prior Anticoagulants: The patient has                            taken no anticoagulant or antiplatelet agents. ASA                            Grade Assessment: III - A patient with severe  systemic disease. After reviewing the risks and                            benefits, the patient was deemed in satisfactory                            condition to undergo the procedure.                           After obtaining informed consent, the endoscope was                            passed under direct vision. Throughout the                            procedure, the  patient's blood pressure, pulse, and                            oxygen saturations were monitored continuously. The                            [id number]-[id number] ([id number]) scope was introduced through the                            mouth, and advanced to the second part of duodenum.                            The upper GI endoscopy was accomplished without                            difficulty. The patient tolerated the procedure                            well. Scope In: 10:34:06 AM Scope Out: 10:39:28 AM Total Procedure Duration: 0 hours 5 minutes 22 seconds  Findings:      The examined esophagus was normal.      Moderate portal hypertensive gastropathy was found in the entire       examined stomach. Punctate antral hemorrhages. No gastric varices. This       was biopsied with a cold forceps for histology. Estimated blood loss was       minimal.      The duodenal bulb and second portion of the duodenum were normal. Impression:               - Normal esophagus.                           - Portal hypertensive gastropathy. Biopsied.                           - Normal duodenal bulb and second portion of the                            duodenum. Moderate Sedation:      Moderate (conscious) sedation was personally administered by an  anesthesia professional. The following parameters were monitored: oxygen       saturation, heart rate, blood pressure, respiratory rate, EKG, adequacy       of pulmonary ventilation, and response to care. Recommendation:           - Patient has a contact number available for                            emergencies. The signs and symptoms of potential                            delayed complications were discussed with the                            patient. Return to normal activities tomorrow.                            Written discharge instructions were provided to the                            patient.                           - Resume previous diet.                            - Continue present medications.                           - Return to my office in 6 weeks. Procedure Code(s):        --- Professional ---                           [phone], Esophagogastroduodenoscopy, flexible,                            transoral; with biopsy, single or multiple Diagnosis Code(s):        --- Professional ---                           K76.6, Portal hypertension                           K31.89, Other diseases of stomach and duodenum                           Z13.810, Encounter for screening for upper                            gastrointestinal disorder                           K74.60, Unspecified cirrhosis of liver CPT copyright 2022 American Medical Association. All rights reserved. The codes documented in this report are preliminary and upon coder review may  be revised to meet current compliance requirements. Lamar HERO. Zion Lint, MD Lamar Ozell Hollingshead, MD 04/26/2024 10:51:50 AM This report has been signed electronically. Number  of Addenda: 0

## 2024-04-26 NOTE — Discharge Instructions (Addendum)
 EGD Discharge instructions Please read the instructions outlined below and refer to this sheet in the next few weeks. These discharge instructions provide you with general information on caring for yourself after you leave the hospital. Your doctor may also give you specific instructions. While your treatment has been planned according to the most current medical practices available, unavoidable complications occasionally occur. If you have any problems or questions after discharge, please call your doctor. ACTIVITY You may resume your regular activity but move at a slower pace for the next 24 hours.  Take frequent rest periods for the next 24 hours.  Walking will help expel (get rid of) the air and reduce the bloated feeling in your abdomen.  No driving for 24 hours (because of the anesthesia (medicine) used during the test).  You may shower.  Do not sign any important legal documents or operate any machinery for 24 hours (because of the anesthesia used during the test).  NUTRITION Drink plenty of fluids.  You may resume your normal diet.  Begin with a light meal and progress to your normal diet.  Avoid alcoholic beverages for 24 hours or as instructed by your caregiver.  MEDICATIONS You may resume your normal medications unless your caregiver tells you otherwise.  WHAT YOU CAN EXPECT TODAY You may experience abdominal discomfort such as a feeling of fullness or "gas" pains.  FOLLOW-UP Your doctor will discuss the results of your test with you.  SEEK IMMEDIATE MEDICAL ATTENTION IF ANY OF THE FOLLOWING OCCUR: Excessive nausea (feeling sick to your stomach) and/or vomiting.  Severe abdominal pain and distention (swelling).  Trouble swallowing.  Temperature over 101 F (37.8 C).  Rectal bleeding or vomiting of blood.     No esophageal varices found today  There are changes in the stomach as seen before consistent with chronic liver disease.  Biopsies taken.  Further recommendations to  follow in the near future  Office visit with   Therisa Stager in 6 weeks   at patient request I called Gee Habig at [phone] and reviewed findings and recommendation

## 2024-04-26 NOTE — Anesthesia Preprocedure Evaluation (Signed)
 Anesthesia Evaluation  Patient identified by MRN, date of birth, ID band Patient awake    Reviewed: Allergy & Precautions, H&P , NPO status , Patient's Chart, lab work & pertinent test results, reviewed documented beta blocker date and time   History of Anesthesia Complications (+) PONV and history of anesthetic complications  Airway Mallampati: II  TM Distance: >3 FB Neck ROM: full    Dental no notable dental hx.    Pulmonary neg pulmonary ROS, former smoker   Pulmonary exam normal breath sounds clear to auscultation       Cardiovascular Exercise Tolerance: Good hypertension,  Rhythm:regular Rate:Normal     Neuro/Psych  Neuromuscular disease  negative psych ROS   GI/Hepatic PUD,,,(+) Cirrhosis         Endo/Other  diabetes    Renal/GU negative Renal ROS  negative genitourinary   Musculoskeletal   Abdominal   Peds  Hematology negative hematology ROS (+)   Anesthesia Other Findings   Reproductive/Obstetrics negative OB ROS                             Anesthesia Physical Anesthesia Plan  ASA: 3  Anesthesia Plan: General   Post-op Pain Management:    Induction:   PONV Risk Score and Plan: Propofol  infusion  Airway Management Planned:   Additional Equipment:   Intra-op Plan:   Post-operative Plan:   Informed Consent: I have reviewed the patients History and Physical, chart, labs and discussed the procedure including the risks, benefits and alternatives for the proposed anesthesia with the patient or authorized representative who has indicated his/her understanding and acceptance.     Dental Advisory Given  Plan Discussed with: CRNA  Anesthesia Plan Comments:        Anesthesia Quick Evaluation

## 2024-04-26 NOTE — Anesthesia Postprocedure Evaluation (Signed)
 Anesthesia Post Note  Patient: Eric Dougherty  Procedure(s) Performed: EGD (ESOPHAGOGASTRODUODENOSCOPY)  Patient location during evaluation: Phase II Anesthesia Type: General Level of consciousness: awake Pain management: pain level controlled Vital Signs Assessment: post-procedure vital signs reviewed and stable Respiratory status: spontaneous breathing and respiratory function stable Cardiovascular status: blood pressure returned to baseline and stable Postop Assessment: no headache and no apparent nausea or vomiting Anesthetic complications: no Comments: Late entry   No notable events documented.   Last Vitals:  Vitals:   04/26/24 0932 04/26/24 1047  BP: (!) 152/73 129/72  Pulse: 61 (!) 57  Resp: 15 10  Temp:  (!) 36.4 C  SpO2: 96% 98%    Last Pain:  Vitals:   04/26/24 1047  TempSrc: Oral  PainSc: 0-No pain                 Yvonna JINNY Bosworth

## 2024-04-26 NOTE — H&P (Signed)
 @LOGO @   Primary Care Physician:  Leonce Lucie PARAS, PA-C Primary Gastroenterologist:  Dr. Shaaron  Pre-Procedure History & Physical: HPI:  Eric Dougherty is a 73 y.o. male here for   Screening for esophageal varices.  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  Prior history of portal gastropathy.  Past Medical History:  Diagnosis Date   Cirrhosis (HCC)    Diabetes mellitus    resolution of symptoms   Hypertension    no longer on meds   PONV (postoperative nausea and vomiting)    Sciatica     Past Surgical History:  Procedure Laterality Date   BIOPSY  11/09/2018   Procedure: BIOPSY;  Surgeon: Shaaron Lamar HERO, MD;  Location: [facility];  Service: Endoscopy;;  gastric    COLONOSCOPY  11/20/2005   RMR: Minimal hemorrhoids, otherwise normal   COLONOSCOPY N/A 03/25/2016   three semi-pedunculated polyps 4-6 mm in size, multiple medium-mouthed sigmoid diverticula (hyperplastic).    COLONOSCOPY WITH PROPOFOL  N/A 01/02/2021   normal   ESOPHAGOGASTRODUODENOSCOPY N/A 11/09/2018   11/09/2018 with erosive reflux esophagitis, erythematous mucosa in the stomach status post biopsy found to be mild chronic gastritis.   ESOPHAGOGASTRODUODENOSCOPY (EGD) WITH PROPOFOL  N/A 01/02/2021   normal esophagus, portal gastropathy that was mild, normal duodenum.   HEMORRHOID SURGERY     HERNIA REPAIR     LITHOTRIPSY     TONSILLECTOMY     TOOTH EXTRACTION      Prior to Admission medications   Medication Sig Start Date End Date Taking? Authorizing Provider  aspirin EC 81 MG tablet Take 81 mg by mouth daily.   Yes [provider]  atorvastatin  (LIPITOR) 40 MG tablet TAKE 1 TABLET ONCE DAILY. Patient taking differently: Take 40 mg by mouth daily. 01/13/19  Yes BranchDorn FALCON, MD  Cholecalciferol (VITAMIN D3) 50 MCG (2000 UT) TABS Take 2,000 Units by mouth daily.   Yes [provider]  dorzolamide-timolol (COSOPT) 2-0.5 % ophthalmic solution Place 1 drop into  both eyes 2 (two) times daily. 01/10/24  Yes [provider]  empagliflozin (JARDIANCE) 25 MG TABS tablet Take 25 mg by mouth daily.   Yes [provider]  famotidine (PEPCID) 20 MG tablet Take 20 mg by mouth daily.   Yes [provider]  folic acid (FOLVITE) 1 MG tablet Take 1,000 mcg by mouth daily.   Yes [provider]  Ibuprofen -Acetaminophen  (ADVIL  DUAL ACTION) 125-250 MG TABS Take 1 tablet by mouth daily as needed (Pain).   Yes [provider]  latanoprost (XALATAN) 0.005 % ophthalmic solution Place 1 drop into both eyes at bedtime. 12/05/14  Yes [provider]  lisinopril (ZESTRIL) 10 MG tablet Take 10 mg by mouth daily.   Yes [provider]  naproxen sodium (ALEVE) 220 MG tablet Take 440 mg by mouth daily as needed (pain).   Yes [provider]  tadalafil (CIALIS) 5 MG tablet Take 5 mg by mouth daily.   Yes [provider]  tamsulosin (FLOMAX) 0.4 MG CAPS capsule Take 0.4 mg by mouth daily.  04/16/13  Yes [provider]  thiamine (VITAMIN B-1) 100 MG tablet Take 100 mg by mouth daily.   Yes [provider]  vitamin C (ASCORBIC ACID) 500 MG tablet Take 500 mg by mouth daily.   Yes [provider]  zinc gluconate 50 MG tablet Take 50 mg by mouth daily.   Yes [provider]  OZEMPIC, 0.25 OR 0.5 MG/DOSE, 2 MG/3ML  SOPN Inject 2 mg into the skin once a week. 01/26/24   [provider]    Allergies as of 03/07/2024   (No Known Allergies)    Family History  Problem Relation Age of Onset   Leukemia Father    Hypothyroidism Brother    Glaucoma Brother    Rectal cancer Brother 21       s/p surgical resection; dx 2 years ago   Osteoarthritis Other    Prostate cancer Paternal Grandfather    Cirrhosis Cousin     Social History   Socioeconomic History   Marital status: Married    Spouse name: Not on file   Number of children: Not on file   Years of education:  Not on file   Highest education level: Not on file  Occupational History   Not on file  Tobacco Use   Smoking status: Former   Smokeless tobacco: Former    Types: Chew    Quit date: 11/02/1978  Vaping Use   Vaping status: Never Used  Substance and Sexual Activity   Alcohol use: Not Currently    Alcohol/week: 7.0 standard drinks of alcohol    Types: 7 Standard drinks or equivalent per week    Comment: 2-3 at a time; 2-3 times a week; previously: none in 2019 but did have 1 drink in June on vacation.   Drug use: No   Sexual activity: Not on file  Other Topics Concern   Not on file  Social History Narrative   ** Merged History Encounter **       Social Drivers of Corporate investment banker Strain: Not on file  Food Insecurity: Not on file  Transportation Needs: Not on file  Physical Activity: Not on file  Stress: Not on file  Social Connections: Not on file  Intimate Partner Violence: Not on file    Review of Systems: See HPI, otherwise negative ROS  Physical Exam: BP (!) 152/73   Pulse 61   Temp (!) 97.5 F (36.4 C) (Oral)   Resp 15   Ht 5' 8 (1.727 m)   Wt 107.5 kg   SpO2 96%   BMI 36.04 kg/m  General:   Alert,  Well-developed, well-nourished, pleasant and cooperative in NAD Neck:  Supple; no masses or thyromegaly. No significant cervical adenopathy. Lungs:  Clear throughout to auscultation.   No wheezes, crackles, or rhonchi. No acute distress. Heart:  Regular rate and rhythm; no murmurs, clicks, rubs,  or gallops. Abdomen: Non-distended, normal bowel sounds.  Soft and nontender without appreciable mass or hepatosplenomegaly.   Impression/Plan:    73 year old gentleman F3 liver fibrosis known portal gastropathy thrombocytopenia he is here for screening EGD to assess for varices.  Denies dysphagia.  Surveillance EGD per plan.  The risks, benefits, limitations, alternatives and imponderables have been reviewed with the patient. Potential for esophageal dilation,  biopsy, etc. have also been reviewed.  Questions have been answered. All parties agreeable.      Notice: This dictation was prepared with Dragon dictation along with smaller phrase technology. Any transcriptional errors that result from this process are unintentional and may not be corrected upon review.

## 2024-04-26 NOTE — Transfer of Care (Signed)
 Immediate Anesthesia Transfer of Care Note  Patient: Eric Dougherty  Procedure(s) Performed: EGD (ESOPHAGOGASTRODUODENOSCOPY)  Patient Location: Short Stay  Anesthesia Type:General  Level of Consciousness: awake and patient cooperative  Airway & Oxygen Therapy: Patient Spontanous Breathing  Post-op Assessment: Report given to RN and Post -op Vital signs reviewed and stable  Post vital signs: Reviewed and stable  Last Vitals:  Vitals Value Taken Time  BP 129/72 04/26/24 10:47  Temp 36.4 C 04/26/24 10:47  Pulse 57 04/26/24 10:47  Resp 10 04/26/24 10:47  SpO2 98 % 04/26/24 10:47    Last Pain:  Vitals:   04/26/24 1047  TempSrc: Oral  PainSc: 0-No pain         Complications: No notable events documented.

## 2024-04-26 NOTE — Anesthesia Procedure Notes (Signed)
 Date/Time: 04/26/2024 10:34 AM  Performed by: Para Jerelene CROME, CRNAOxygen Delivery Method: Nasal cannula Comments: OptiFlow Nasal Cannula.

## 2024-04-27 ENCOUNTER — Encounter (HOSPITAL_COMMUNITY): Payer: Self-pay | Admitting: Internal Medicine

## 2024-04-27 LAB — SURGICAL PATHOLOGY

## 2024-04-30 ENCOUNTER — Ambulatory Visit: Payer: Self-pay | Admitting: Internal Medicine

## 2024-05-23 ENCOUNTER — Encounter (HOSPITAL_COMMUNITY): Payer: Self-pay

## 2024-05-23 ENCOUNTER — Encounter (HOSPITAL_COMMUNITY)
Admission: RE | Admit: 2024-05-23 | Discharge: 2024-05-23 | Disposition: A | Discharge status: Home / Self Care | Source: Ambulatory Visit | Attending: Optometry | Admitting: Optometry

## 2024-05-23 HISTORY — DX: Sleep apnea, unspecified: G47.30

## 2024-05-24 DIAGNOSIS — H2511 Age-related nuclear cataract, right eye: Secondary | ICD-10-CM | POA: Diagnosis not present

## 2024-05-24 NOTE — H&P (Signed)
 Surgical History & Physical  Patient Name: Eric Dougherty  DOB: [birth year]  Surgery: Cataract extraction with intraocular lens implant phacoemulsification; Right Eye : Goniotomy Omni procedure; Right Eye Surgeon: Marsa Cleverly MD Surgery Date: 05/26/2024 Pre-Op Date: 03/15/2024  HPI: A 34 Yr. old male patient present for 3 month follow up, cataract and POAG OU. Patient states in order to read, he needs really good light. Complains of not being able to see as well as he used to. Difficulties more with small print and filling out forms. This is negatively affecting the patient's quality of life and the patient is unable to function adequately in life with the current level of vision. Using Dorz/Timolol BID OU and Latanoprost qhs OU.  Medical History: Glaucoma Cataracts  Diabetes High Blood Pressure  Review of Systems Cardiovascular High Blood Pressure Endocrine diabetes All recorded systems are negative except as noted above.  Social Former smoker   Medication Latanoprost, Cosopt, Prednisolone-moxiflox-bromfen,  tadalafil ,  Ozempic ,  atorvastatin  ,  lisinopril ,  Jardiance ,  tamsulosin   Sx/Procedures Lasik  Drug Allergies  NKDA  History & Physical: Heent: cataracts NECK: supple without bruits LUNGS: lungs clear to auscultation CV: regular rate and rhythm Abdomen: soft and non-tender  Impression & Plan: Assessment: 1.  CATARACT AGE-RELATED NUCLEAR; Both Eyes (H25.13) 2.  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  s/p LASIK/PRK/RK ; Both Eyes (Z48.810) 6.  DRY EYE SYNDROME/TEAR FILM INSUFFICIENCY; Both Eyes (H04.123)  Plan: 1.  Cataracts are visually significant and account for the patient's complaints. Discussed all risks, benefits, procedures and recovery, including infection, loss of vision and eye, need for glasses after surgery or additional  procedures. Patient understands changing glasses will not improve vision. Patient indicated understanding of procedure. All questions answered. Patient desires to have surgery, recommend phacoemulsification with intraocular lens. Patient to have preliminary testing necessary (Argos/IOL Master, Mac OCT, TOPO) Educational materials provided:Cataract.  Plan: - Proceed with cataract surgery OD, followed by OS - target shooter and sights with left eye - Plan for best distance target with DIB00 - HX OF LASIK, needs BARRETT TRUE K calcs - Moderate glaucoma OU and plan for MIGS with Omni - No DM, no fuchs, no prior eye surgery - good dilation - Dextenza if available  2.  Glaucoma: large cups, IOP high, and HVF worse OD than OS. Consider MIGs along with cataract extraction  Current gtts: Cosopt BID OU, Latanoprost QHS OU Surgical Hx:  Family Hx: no known  Tmax: 28/25 on Latanoprost  Gonio (12/21/23): open throughout CCT: 516/526  OCT (10/19/23): OD: ave 52 OS: ave 61  HVF 24-2 12/21/23 OD: Sup>inf arcuate, mild diffuse depression, MD -7.4 OS: early inf arcaute, MD -4.58  3.  Per Dr. Darroll  4.  Overall mild, will use artificial tears as noted below  5.  Performed around 2000 by Dr. Meridee  6.  Dry eye. Mild signs of dry eye at this time. Can use artificial tears QID OU PRN and warm compresses once daily as needed.

## 2024-05-26 ENCOUNTER — Encounter (HOSPITAL_COMMUNITY)
Admission: RE | Disposition: A | Discharge status: Home / Self Care | Payer: Self-pay | Source: Home / Self Care | Attending: Optometry

## 2024-05-26 ENCOUNTER — Ambulatory Visit (HOSPITAL_COMMUNITY)
Admission: RE | Admit: 2024-05-26 | Discharge: 2024-05-26 | Disposition: A | Discharge status: Home / Self Care | Attending: Optometry | Admitting: Optometry

## 2024-05-26 ENCOUNTER — Ambulatory Visit (HOSPITAL_COMMUNITY): Payer: Self-pay | Admitting: Anesthesiology

## 2024-05-26 DIAGNOSIS — K7460 Unspecified cirrhosis of liver: Secondary | ICD-10-CM | POA: Insufficient documentation

## 2024-05-26 DIAGNOSIS — E1139 Type 2 diabetes mellitus with other diabetic ophthalmic complication: Secondary | ICD-10-CM | POA: Diagnosis not present

## 2024-05-26 DIAGNOSIS — H2181 Floppy iris syndrome: Secondary | ICD-10-CM | POA: Diagnosis not present

## 2024-05-26 DIAGNOSIS — I10 Essential (primary) hypertension: Secondary | ICD-10-CM | POA: Insufficient documentation

## 2024-05-26 DIAGNOSIS — H401113 Primary open-angle glaucoma, right eye, severe stage: Secondary | ICD-10-CM | POA: Diagnosis not present

## 2024-05-26 DIAGNOSIS — H2511 Age-related nuclear cataract, right eye: Secondary | ICD-10-CM | POA: Diagnosis not present

## 2024-05-26 DIAGNOSIS — G709 Myoneural disorder, unspecified: Secondary | ICD-10-CM | POA: Diagnosis not present

## 2024-05-26 DIAGNOSIS — H5203 Hypermetropia, bilateral: Secondary | ICD-10-CM | POA: Insufficient documentation

## 2024-05-26 DIAGNOSIS — Z87891 Personal history of nicotine dependence: Secondary | ICD-10-CM | POA: Diagnosis not present

## 2024-05-26 DIAGNOSIS — H2513 Age-related nuclear cataract, bilateral: Secondary | ICD-10-CM | POA: Insufficient documentation

## 2024-05-26 DIAGNOSIS — H5711 Ocular pain, right eye: Secondary | ICD-10-CM | POA: Insufficient documentation

## 2024-05-26 DIAGNOSIS — H18593 Other hereditary corneal dystrophies, bilateral: Secondary | ICD-10-CM | POA: Diagnosis not present

## 2024-05-26 DIAGNOSIS — G4730 Sleep apnea, unspecified: Secondary | ICD-10-CM | POA: Diagnosis not present

## 2024-05-26 DIAGNOSIS — Z8711 Personal history of peptic ulcer disease: Secondary | ICD-10-CM | POA: Insufficient documentation

## 2024-05-26 DIAGNOSIS — E1136 Type 2 diabetes mellitus with diabetic cataract: Secondary | ICD-10-CM | POA: Insufficient documentation

## 2024-05-26 DIAGNOSIS — H04123 Dry eye syndrome of bilateral lacrimal glands: Secondary | ICD-10-CM | POA: Diagnosis not present

## 2024-05-26 HISTORY — PX: CATARACT EXTRACTION W/PHACO: SHX586

## 2024-05-26 HISTORY — PX: ANTERIOR VITRECTOMY: SHX1173

## 2024-05-26 HISTORY — PX: INSERTION, STENT, DRUG-ELUTING, LACRIMAL CANALICULUS: SHX7453

## 2024-05-26 LAB — GLUCOSE, CAPILLARY: Glucose-Capillary: 108 mg/dL — ABNORMAL HIGH (ref 70–99)

## 2024-05-26 SURGERY — PHACOEMULSIFICATION, CATARACT, WITH IOL INSERTION
Anesthesia: Monitor Anesthesia Care | Site: Eye | Laterality: Right

## 2024-05-26 MED ORDER — DEXAMETHASONE 0.4 MG OP INST
VAGINAL_INSERT | OPHTHALMIC | Status: DC | PRN
  Administered 2024-05-26: .4 mg via OPHTHALMIC

## 2024-05-26 MED ORDER — LACTATED RINGERS IV SOLN: INTRAVENOUS | Status: DC

## 2024-05-26 MED ORDER — EPHEDRINE 5 MG/ML INJ
INTRAVENOUS | Status: AC
  Filled 2024-05-26: qty 5

## 2024-05-26 MED ORDER — PHENYLEPHRINE HCL 2.5 % OP SOLN
1.0000 [drp] | OPHTHALMIC | Status: AC
  Administered 2024-05-26 (×3): 1 [drp] via OPHTHALMIC

## 2024-05-26 MED ORDER — TETRACAINE 0.5 % OP SOLN OPTIME - NO CHARGE
OPHTHALMIC | Status: DC | PRN
  Administered 2024-05-26 (×2): 2 [drp] via OPHTHALMIC

## 2024-05-26 MED ORDER — TROPICAMIDE 1 % OP SOLN
1.0000 [drp] | OPHTHALMIC | Status: AC
  Administered 2024-05-26 (×3): 1 [drp] via OPHTHALMIC

## 2024-05-26 MED ORDER — MIDAZOLAM HCL 2 MG/2ML IJ SOLN
INTRAMUSCULAR | Status: DC | PRN
Start: 2024-05-26 — End: 2024-05-26
  Administered 2024-05-26: 2 mg via INTRAVENOUS

## 2024-05-26 MED ORDER — LIDOCAINE HCL 3.5 % OP GEL
1.0000 | Freq: Once | OPHTHALMIC | Status: AC
  Administered 2024-05-26: 1 via OPHTHALMIC

## 2024-05-26 MED ORDER — STERILE WATER FOR IRRIGATION IR SOLN
Status: DC | PRN
Start: 2024-05-26 — End: 2024-05-26
  Administered 2024-05-26: 1

## 2024-05-26 MED ORDER — SIGHTPATH DOSE#1 NA HYALUR & NA CHOND-NA HYALUR IO KIT
PACK | INTRAOCULAR | Status: DC | PRN
  Administered 2024-05-26: 1 via OPHTHALMIC

## 2024-05-26 MED ORDER — MIDAZOLAM HCL 2 MG/2ML IJ SOLN
INTRAMUSCULAR | Status: AC
  Filled 2024-05-26: qty 2

## 2024-05-26 MED ORDER — TETRACAINE HCL 0.5 % OP SOLN
1.0000 [drp] | OPHTHALMIC | Status: AC
  Administered 2024-05-26 (×3): 1 [drp] via OPHTHALMIC

## 2024-05-26 MED ORDER — MOXIFLOXACIN HCL 5 MG/ML IO SOLN
INTRAOCULAR | Status: DC | PRN
  Administered 2024-05-26: .2 mL via INTRACAMERAL

## 2024-05-26 MED ORDER — CARBACHOL 0.01 % IO SOLN
INTRAOCULAR | Status: AC
  Filled 2024-05-26: qty 1.5

## 2024-05-26 MED ORDER — ACETAZOLAMIDE ER 500 MG PO CP12
500.0000 mg | ORAL_CAPSULE | Freq: Two times a day (BID) | ORAL | Status: DC
  Administered 2024-05-26: 500 mg via ORAL
  Filled 2024-05-26 (×3): qty 1

## 2024-05-26 MED ORDER — PHENYLEPHRINE-KETOROLAC 1-0.3 % IO SOLN
INTRAOCULAR | Status: DC | PRN
  Administered 2024-05-26: 500 mL via OPHTHALMIC

## 2024-05-26 MED ORDER — DEXAMETHASONE 0.4 MG OP INST
VAGINAL_INSERT | OPHTHALMIC | Status: AC
  Filled 2024-05-26: qty 1

## 2024-05-26 MED ORDER — SIGHTPATH DOSE#1 SODIUM HYALURONATE 10 MG/ML IO SOLUTION
PREFILLED_SYRINGE | INTRAOCULAR | Status: DC | PRN
  Administered 2024-05-26: .85 mL via INTRAOCULAR

## 2024-05-26 MED ORDER — POVIDONE-IODINE 5 % OP SOLN
OPHTHALMIC | Status: DC | PRN
  Administered 2024-05-26: 1 via OPHTHALMIC

## 2024-05-26 MED ORDER — LIDOCAINE HCL (PF) 1 % IJ SOLN
INTRAMUSCULAR | Status: DC | PRN
Start: 2024-05-26 — End: 2024-05-26
  Administered 2024-05-26: 1 mL

## 2024-05-26 MED ORDER — FENTANYL CITRATE (PF) 100 MCG/2ML IJ SOLN
INTRAMUSCULAR | Status: DC | PRN
  Administered 2024-05-26: 50 ug via INTRAVENOUS

## 2024-05-26 MED ORDER — FENTANYL CITRATE (PF) 100 MCG/2ML IJ SOLN
INTRAMUSCULAR | Status: AC
  Filled 2024-05-26: qty 2

## 2024-05-26 MED ORDER — SODIUM CHLORIDE 0.9% FLUSH
INTRAVENOUS | Status: DC | PRN
Start: 2024-05-26 — End: 2024-05-26
  Administered 2024-05-26 (×2): 10 mL via INTRAVENOUS

## 2024-05-26 MED ORDER — BSS IO SOLN
INTRAOCULAR | Status: DC | PRN
  Administered 2024-05-26: 15 mL via INTRAOCULAR

## 2024-05-26 SURGICAL SUPPLY — 19 items
CATARACT SUITE SIGHTPATH (MISCELLANEOUS) ×2 IMPLANT
CLIP IPRISM (KITS) ×1 IMPLANT
CLOTH BEACON ORANGE TIMEOUT ST (SAFETY) ×2 IMPLANT
DEVICE OMNI ERGO OPTH SU (OPHTHALMIC) ×1 IMPLANT
DRSG TEGADERM 4X4.75 (GAUZE/BANDAGES/DRESSINGS) ×2 IMPLANT
EYE SHIELD UNIVERSAL CLEAR (GAUZE/BANDAGES/DRESSINGS) ×1 IMPLANT
FEE CATARACT SUITE SIGHTPATH (MISCELLANEOUS) ×2 IMPLANT
GLOVE BIOGEL PI IND STRL 7.0 (GLOVE) ×4 IMPLANT
LENS IOL TECNIS EYHANCE 20.5 (Intraocular Lens) ×1 IMPLANT
NDL HYPO 18GX1.5 BLUNT FILL (NEEDLE) ×1 IMPLANT
NEEDLE HYPO 18GX1.5 BLUNT FILL (NEEDLE) ×2 IMPLANT
PACK VITRECTOMY ANTERIOR (MISCELLANEOUS) ×1 IMPLANT
PAD ARMBOARD POSITIONER FOAM (MISCELLANEOUS) ×2 IMPLANT
POSITIONER HEAD 8X9X4 ADT (SOFTGOODS) ×2 IMPLANT
RING TENSION CAPSULAR (Miscellaneous) ×1 IMPLANT
SYR TB 1ML LL NO SAFETY (SYRINGE) ×2 IMPLANT
TAPE SURG TRANSPORE 1 IN (GAUZE/BANDAGES/DRESSINGS) ×1 IMPLANT
VISCOELASTIC (OPHTHALMIC) ×1 IMPLANT
WATER STERILE IRR 250ML POUR (IV SOLUTION) ×2 IMPLANT

## 2024-05-26 NOTE — Discharge Instructions (Signed)
 Please discharge patient when stable, will follow up today with Dr. Ilsa Iha at the [facility] office immediately following discharge.  Leave shield in place until visit.  All paperwork with discharge instructions will be given at the office.  [facility] Address:  [address]  [place], [place] [address]  Dr. Chaya Jan Phone: [phone]

## 2024-05-26 NOTE — Interval H&P Note (Signed)
 History and Physical Interval Note:  05/26/2024 9:21 AM  The H and P was reviewed and updated. The patient was examined.  No changes were found after exam.  The surgical eye was marked.   Eric Dougherty

## 2024-05-26 NOTE — Transfer of Care (Addendum)
 Immediate Anesthesia Transfer of Care Note  Patient: Eric Dougherty  Procedure(s) Performed: PHACOEMULSIFICATION, CATARACT, WITH IOL INSERTION (Right: Eye) INSERTION, STENT, DRUG-ELUTING, LACRIMAL CANALICULUS (Right: Eye) VITRECTOMY, ANTERIOR (Right: Eye)  Patient Location: Short Stay  Anesthesia Type:MAC  Level of Consciousness: awake, alert , oriented, and patient cooperative  Airway & Oxygen Therapy: Patient Spontanous Breathing  Post-op Assessment: Report given to RN and Post -op Vital signs reviewed and stable  Post vital signs: Reviewed and stable  Last Vitals:  Vitals Value Taken Time  BP 141/71 05/26/24   1020  Temp 36.5 05/26/24   1020  Pulse 63 05/26/24   1020  Resp 18 05/26/24   1020  SpO2 97% 05/26/24   1020    Last Pain:  Vitals:   05/26/24 0906  PainSc: 0-No pain         Complications: No notable events documented.

## 2024-05-26 NOTE — Anesthesia Preprocedure Evaluation (Signed)
 Anesthesia Evaluation  Patient identified by MRN, date of birth, ID band Patient awake    Reviewed: Allergy & Precautions, H&P , NPO status , Patient's Chart, lab work & pertinent test results, reviewed documented beta blocker date and time   History of Anesthesia Complications (+) PONV and history of anesthetic complications  Airway Mallampati: II  TM Distance: >3 FB Neck ROM: full    Dental no notable dental hx.    Pulmonary sleep apnea , former smoker   Pulmonary exam normal breath sounds clear to auscultation       Cardiovascular Exercise Tolerance: Good hypertension,  Rhythm:regular Rate:Normal     Neuro/Psych  Neuromuscular disease  negative psych ROS   GI/Hepatic PUD,,,(+) Cirrhosis         Endo/Other  diabetes    Renal/GU negative Renal ROS  negative genitourinary   Musculoskeletal   Abdominal   Peds  Hematology negative hematology ROS (+)   Anesthesia Other Findings   Reproductive/Obstetrics negative OB ROS                              Anesthesia Physical Anesthesia Plan  ASA: 3  Anesthesia Plan: MAC   Post-op Pain Management:    Induction:   PONV Risk Score and Plan:   Airway Management Planned:   Additional Equipment:   Intra-op Plan:   Post-operative Plan:   Informed Consent: I have reviewed the patients History and Physical, chart, labs and discussed the procedure including the risks, benefits and alternatives for the proposed anesthesia with the patient or authorized representative who has indicated his/her understanding and acceptance.     Dental Advisory Given  Plan Discussed with: CRNA  Anesthesia Plan Comments:         Anesthesia Quick Evaluation

## 2024-05-26 NOTE — Op Note (Signed)
 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 Item Serial No. Manufacturer Lot No. LRB No. Used Action  LENS IOL TECNIS EYHANCE 20.5 - [id number] Intraocular Lens LENS IOL TECNIS EYHANCE 20.5 [id number] [facility]  Right 1 Implanted  capsular tension ring   [id number]  [id number] Right 1 Implanted    Indications:  Visually significant age-related cataract, Right Eye  Procedure:  The patient was seen and identified in the pre-operative area. The operative eye was identified and dilated.  The operative eye was marked.  Topical anesthesia was administered to the operative eye.     The patient was then to the operative suite and placed in the supine position.  A timeout was performed confirming the patient, procedure to be performed, and all other relevant information.   The patient's face was prepped and draped in the usual fashion for intra-ocular surgery.  A lid speculum was placed into the operative eye and the surgical microscope moved into place and focused.  A superotemporal paracentesis was created using a 20 gauge paracentesis blade.  BSS mixed with Omidria , followed by 1% lidocaine  was injected into the anterior chamber.  Viscoelastic  was injected into the anterior chamber.  A temporal clear-corneal main wound incision was created using a 2.36mm microkeratome.  A continuous curvilinear capsulorrhexis was initiated using an irrigating cystitome and completed using capsulorrhexis forceps.  Hydrodissection and hydrodeliniation were performed.  Viscoelastic was injected into the anterior chamber.  A phacoemulsification handpiece and a chopper as a second instrument were used to remove the nucleus and epinucleus. The irrigation/aspiration handpiece was used to remove any remaining cortical material.   The capsular bag was reinflated with viscoelastic, checked, and found to be intact. At this point, posterior pressure was noted and the iris moved anteriorly. The intraocular lens was inserted into the capsular bag, though the patient noted pain and movement with insertion. Afterwards there was notable inferior nasal zonular loss. The head of bed was elevated and lid speculum was narrowed. Further provisc was injected into the anterior chamber. A 12 mm capsular tension ring was inserted and rotated into position within the capsular bag with good centration of the intraocular lens.  The capsular bag remained intact but there was notably an area of iris peaking inferiorly in the area where zonular loss had been noted. A paracentesis was created inferiorly, and the vitrector was set up. Anterior vitrectomy was performed in the area anterior to the prior zonular dehiscence. There was noted to be a vitreous strand to the inferior paracentesis which was cut away using a Weck-cel and scissors. Further anterior vitrectomy was completed in this area. The viscoelastic was irrigated out. The clear corneal wound and paracenteses incisions were then hydrated and checked with Weck-Cels to be watertight. Moxifloxacin  was instilled into the anterior chamber.  The lid-speculum and drape were removed. The lower punctum was dilated, and the dextenza   implant was inserted  into it. The patient's face was cleaned with a wet and dry 4x4. A clear shield was taped over the eye. The patient was taken to the post-operative care unit in good condition, having tolerated the procedure well. The patient was given 500 mg of Diamox  in the post-operative area.   Post-Op Instructions: The patient will follow up at [facility] for a same day post-operative evaluation and will receive all other orders and instructions.

## 2024-05-27 NOTE — Anesthesia Postprocedure Evaluation (Signed)
 Anesthesia Post Note  Patient: Eric Dougherty  Procedure(s) Performed: PHACOEMULSIFICATION, CATARACT, WITH IOL INSERTION (Right: Eye) INSERTION, STENT, DRUG-ELUTING, LACRIMAL CANALICULUS (Right: Eye) VITRECTOMY, ANTERIOR (Right: Eye)  Patient location during evaluation: Phase II Anesthesia Type: MAC Level of consciousness: awake Pain management: pain level controlled Vital Signs Assessment: post-procedure vital signs reviewed and stable Respiratory status: spontaneous breathing and respiratory function stable Cardiovascular status: blood pressure returned to baseline and stable Postop Assessment: no headache and no apparent nausea or vomiting Anesthetic complications: no Comments: Late entry   No notable events documented.   Last Vitals:  Vitals:   05/26/24 0930 05/26/24 1020  BP:  (!) 141/71  Pulse: (!) 59 63  Resp: 17 18  Temp:  36.5 C  SpO2: 97% 97%    Last Pain:  Vitals:   05/26/24 1020  TempSrc: Oral  PainSc: 0-No pain                 Yvonna JINNY Bosworth

## 2024-06-05 ENCOUNTER — Encounter (HOSPITAL_COMMUNITY): Payer: Self-pay | Admitting: Optometry

## 2024-06-07 ENCOUNTER — Encounter (HOSPITAL_COMMUNITY): Admission: RE | Admit: 2024-06-07 | Source: Ambulatory Visit

## 2024-06-09 ENCOUNTER — Ambulatory Visit (INDEPENDENT_AMBULATORY_CARE_PROVIDER_SITE_OTHER): Payer: Self-pay | Admitting: Ophthalmology

## 2024-06-09 ENCOUNTER — Encounter (HOSPITAL_COMMUNITY): Admission: RE | Payer: Self-pay | Source: Home / Self Care

## 2024-06-09 ENCOUNTER — Ambulatory Visit (HOSPITAL_COMMUNITY): Admission: RE | Admit: 2024-06-09 | Source: Home / Self Care | Admitting: Optometry

## 2024-06-09 ENCOUNTER — Encounter (INDEPENDENT_AMBULATORY_CARE_PROVIDER_SITE_OTHER): Payer: Self-pay | Admitting: Ophthalmology

## 2024-06-09 DIAGNOSIS — H25812 Combined forms of age-related cataract, left eye: Secondary | ICD-10-CM | POA: Diagnosis not present

## 2024-06-09 DIAGNOSIS — H35033 Hypertensive retinopathy, bilateral: Secondary | ICD-10-CM | POA: Diagnosis not present

## 2024-06-09 DIAGNOSIS — Z7985 Long-term (current) use of injectable non-insulin antidiabetic drugs: Secondary | ICD-10-CM | POA: Diagnosis not present

## 2024-06-09 DIAGNOSIS — Z961 Presence of intraocular lens: Secondary | ICD-10-CM

## 2024-06-09 DIAGNOSIS — I10 Essential (primary) hypertension: Secondary | ICD-10-CM

## 2024-06-09 DIAGNOSIS — E119 Type 2 diabetes mellitus without complications: Secondary | ICD-10-CM

## 2024-06-09 DIAGNOSIS — H318 Other specified disorders of choroid: Secondary | ICD-10-CM

## 2024-06-09 DIAGNOSIS — H4311 Vitreous hemorrhage, right eye: Secondary | ICD-10-CM | POA: Diagnosis not present

## 2024-06-09 SURGERY — PHACOEMULSIFICATION, CATARACT, WITH IOL INSERTION
Anesthesia: Monitor Anesthesia Care | Laterality: Left

## 2024-06-09 MED ORDER — HYDROCODONE-ACETAMINOPHEN 5-325 MG PO TABS
1.0000 | ORAL_TABLET | Freq: Four times a day (QID) | ORAL | 0 refills | Status: DC | PRN
Start: 2024-06-09 — End: 2024-06-09

## 2024-06-09 MED ORDER — HYDROCODONE-ACETAMINOPHEN 5-325 MG PO TABS: 1.0000 | ORAL_TABLET | Freq: Four times a day (QID) | ORAL | 0 refills | Status: DC | PRN

## 2024-06-09 NOTE — Progress Notes (Signed)
 [facility] - Clinic Note  06/09/2024   CHIEF COMPLAINT Patient presents for Retina Evaluation  HISTORY OF PRESENT ILLNESS: Eric Dougherty is a 73 y.o. male who presents to the clinic today for:  HPI     Retina Evaluation   In right eye.  This started 2 weeks ago.  Duration of 2 weeks.  Associated Symptoms Pain.  I, the attending physician,  performed the HPI with the patient and updated documentation appropriately.        Comments   Patient here for Retina Evaluation. Referred by Dr Juli. Patient states vision is a blur in OD. Like looking through wax paper. Sees light. Started since cataract surgery July 25th. Was to be a combo with glaucoma sx but cataract sx was only done. Has eye pain and radiated around OD. It throbs. Using drops OSRX QID OD, Brimonidine TID OD, Timolol BID OU Latanoprost QHS OU ans Atropic BID OD.      Last edited by [name], MD on 06/09/2024 11:19 AM.     Referral from Dr. Juli s/p CE IOL OD on 07.25.25 complicated by anterior vitreous prolapse and underwent limited anterior vitrectomy. Developed choroidals after surgery, referred for retinal eval.   Pt states VA is no better/no worse since CE. Pt states he remembers how he woke up it felt painful in OD, like he had been punched in the eye. Still experiencing some pain, not sleeping well, taking tylenol . Reports family hx of glaucoma. Pt has a list of gtts he's on per Dr. Juli:   JADE (yellow cap) TID OD Brimonidine TID OD Timolol 1gtt every day OD Latanoprost at bedtime OD Dorzolamide TID OU Atropine BID OD   Referring physician: [name], MD [address] [place],  [place] [address]  HISTORICAL INFORMATION:  Selected notes from the MEDICAL RECORD NUMBER Referred by Dr. Juli for [facility] and choroidal effusion OD following cataract surgery LEE:  Ocular Hx- PMH-   CURRENT MEDICATIONS: Current Outpatient Medications (Ophthalmic Drugs)  Medication Sig    dorzolamide-timolol (COSOPT) 2-0.5 % ophthalmic solution Place 1 drop into both eyes 2 (two) times daily.   latanoprost (XALATAN) 0.005 % ophthalmic solution Place 1 drop into both eyes at bedtime.   No current facility-administered medications for this visit. (Ophthalmic Drugs)   Current Outpatient Medications (Other)  Medication Sig   aspirin EC 81 MG tablet Take 81 mg by mouth daily.   atorvastatin  (LIPITOR) 40 MG tablet TAKE 1 TABLET ONCE DAILY. (Patient taking differently: Take 40 mg by mouth daily.)   Cholecalciferol (VITAMIN D3) 50 MCG (2000 UT) TABS Take 2,000 Units by mouth daily.   empagliflozin (JARDIANCE) 25 MG TABS tablet Take 25 mg by mouth daily.   famotidine (PEPCID) 20 MG tablet Take 20 mg by mouth daily.   folic acid (FOLVITE) 1 MG tablet Take 1,000 mcg by mouth daily.   HYDROcodone -acetaminophen  (NORCO/VICODIN) 5-325 MG tablet Take 1 tablet by mouth every 6 (six) hours as needed for moderate pain (pain score 4-6).   lisinopril (ZESTRIL) 10 MG tablet Take 10 mg by mouth daily.   OZEMPIC, 0.25 OR 0.5 MG/DOSE, 2 MG/3ML SOPN Inject 2 mg into the skin once a week.   tadalafil (CIALIS) 5 MG tablet Take 5 mg by mouth daily.   tamsulosin (FLOMAX) 0.4 MG CAPS capsule Take 0.4 mg by mouth daily.    thiamine (VITAMIN B-1) 100 MG tablet Take 100 mg by mouth daily.   vitamin C (ASCORBIC ACID) 500  MG tablet Take 500 mg by mouth daily.   zinc gluconate 50 MG tablet Take 50 mg by mouth daily.   Ibuprofen -Acetaminophen  (ADVIL  DUAL ACTION) 125-250 MG TABS Take 1 tablet by mouth daily as needed (Pain). (Patient not taking: Reported on 06/09/2024)   naproxen sodium (ALEVE) 220 MG tablet Take 440 mg by mouth daily as needed (pain). (Patient not taking: Reported on 06/09/2024)   No current facility-administered medications for this visit. (Other)   REVIEW OF SYSTEMS: ROS   Positive for: Eyes Last edited by Orval Asberry RAMAN, COA on 06/09/2024  9:37 AM.     ALLERGIES No Known Allergies PAST  MEDICAL HISTORY Past Medical History:  Diagnosis Date   Cirrhosis (HCC)    Diabetes mellitus    resolution of symptoms   Hypertension    no longer on meds   PONV (postoperative nausea and vomiting)    Sciatica    Sleep apnea    Past Surgical History:  Procedure Laterality Date   ANTERIOR VITRECTOMY Right 05/26/2024   Procedure: VITRECTOMY, ANTERIOR;  Surgeon: [name], MD;  Location: [facility];  Service: Ophthalmology;  Laterality: Right;   BIOPSY  11/09/2018   Procedure: BIOPSY;  Surgeon: Shaaron Lamar HERO, MD;  Location: [facility];  Service: Endoscopy;;  gastric    CATARACT EXTRACTION W/PHACO Right 05/26/2024   Procedure: PHACOEMULSIFICATION, CATARACT, WITH IOL INSERTION;  Surgeon: [name], MD;  Location: [facility];  Service: Ophthalmology;  Laterality: Right;  CDE: 4.08   COLONOSCOPY  11/20/2005   RMR: Minimal hemorrhoids, otherwise normal   COLONOSCOPY N/A 03/25/2016   three semi-pedunculated polyps 4-6 mm in size, multiple medium-mouthed sigmoid diverticula (hyperplastic).    COLONOSCOPY WITH PROPOFOL  N/A 01/02/2021   normal   ESOPHAGOGASTRODUODENOSCOPY N/A 11/09/2018   11/09/2018 with erosive reflux esophagitis, erythematous mucosa in the stomach status post biopsy found to be mild chronic gastritis.   ESOPHAGOGASTRODUODENOSCOPY N/A 04/26/2024   Procedure: EGD (ESOPHAGOGASTRODUODENOSCOPY);  Surgeon: Shaaron Lamar HERO, MD;  Location: [facility];  Service: Endoscopy;  Laterality: N/A;  11:00 AM , ASA 3   ESOPHAGOGASTRODUODENOSCOPY (EGD) WITH PROPOFOL  N/A 01/02/2021   normal esophagus, portal gastropathy that was mild, normal duodenum.   HEMORRHOID SURGERY     HERNIA REPAIR     INSERTION, STENT, DRUG-ELUTING, LACRIMAL CANALICULUS Right 05/26/2024   Procedure: INSERTION, STENT, DRUG-ELUTING, LACRIMAL CANALICULUS;  Surgeon: [name], MD;  Location: [facility];  Service: Ophthalmology;  Laterality: Right;   LITHOTRIPSY     TONSILLECTOMY     TOOTH EXTRACTION      FAMILY HISTORY Family History  Problem Relation Age of Onset   Leukemia Father    Hypothyroidism Brother    Glaucoma Brother    Rectal cancer Brother 28       s/p surgical resection; dx 2 years ago   Osteoarthritis Other    Prostate cancer Paternal Grandfather    Cirrhosis Cousin    SOCIAL HISTORY Social History   Tobacco Use   Smoking status: Former   Smokeless tobacco: Former    Types: Chew    Quit date: 11/02/1978  Vaping Use   Vaping status: Never Used  Substance Use Topics   Alcohol use: Not Currently    Alcohol/week: 7.0 standard drinks of alcohol    Types: 7 Standard drinks or equivalent per week    Comment: 2-3 at a time; 2-3 times a week; previously: none in 2019 but did have 1 drink in June on vacation.   Drug use: No  OPHTHALMIC EXAM:  Base Eye Exam     Visual Acuity (Snellen - Linear)       Right Left   Dist cc HM 20/30 -1   Dist ph cc  20/25 +1    Correction: Glasses         Tonometry (Tonopen, 9:30 AM)       Right Left   Pressure 28,27 26  Brimonidine and cosopt given OU.         Pupils       Dark Light Shape React APD   Right 6 6 Irregular NR None   Left 3 2 Round Slow None         Visual Fields (Counting fingers)       Left Right    Full    Restrictions  Total superior temporal, inferior temporal, superior nasal, inferior nasal deficiencies         Extraocular Movement       Right Left    Full, Ortho Full, Ortho         Neuro/Psych     Oriented x3: Yes   Mood/Affect: Normal         Dilation     Both eyes: 1.0% Mydriacyl , 2.5% Phenylephrine  @ 9:30 AM           Additional Tests     Keratometry (AR)       K1 Axis K2 Axis   Right 40.75 83 43.00 173   Left 41.00 092 42.50 002           Slit Lamp and Fundus Exam     External Exam       Right Left   External Normal Normal         Slit Lamp Exam       Right Left   Lids/Lashes Dermatochalasis Dermatochalasis, mild Ptosis    Conjunctiva/Sclera White and quiet, temporal pinguecula White and quiet   Cornea Trace PEE, mild tear film debri, Well healed temporal cataract wound trace tear film debris   Anterior Chamber Deep, narrow angle, no NVI Deep, narrow temporal angle   Iris Peaked pupil to 630, well dilated Round and dilated, no NVI   Lens PC IOL in good position 2+ Cortical cataract, 2+ Nuclear sclerosis   Anterior Vitreous Vitreous syneresis, +RBC's, vitreous to corneal wound at 630 Vitreous syneresis         Fundus Exam       Right Left   Disc  Pink and Sharp   C/D Ratio  0.6   Macula  Flat, Blunted foveal reflex, RPE mottling, no heme or edema   Vessels  Attenuated, mild tortuosity   Periphery Diffuse VH, no view Attached, no heme           Refraction     Wearing Rx       Sphere Cylinder Axis Add   Right +0.25 +1.25 167 +2.25   Left -0.50 +2.00 179 +2.25  Before cataract surgery glasses        Manifest Refraction (Auto)       Sphere Cylinder Axis   Right -2.50 +2.50 163   Left +0.50 +2.00 172         Cycloplegic Refraction (Subjective)       Sphere Cylinder Axis Dist VA   Right -2.50 +2.50 164 80-2   Left +1.00 +1.50 180 30+2           IMAGING AND PROCEDURES  Imaging and Procedures for 06/09/2024  OCT,  Retina - OU - Both Eyes       Right Eye Quality was poor. Central Foveal Thickness: 306. Progression has no prior data. Findings include normal foveal contour, no IRF, no SRF (NFP, no IRF no SRF. Scattered vitreous opacities. Irregular RPE contour temporally, consistent w/ choroidal effusion. ).   Left Eye Quality was good. Central Foveal Thickness: 279. Progression has no prior data. Findings include normal foveal contour, no IRF, no SRF.   Notes *Images captured and stored on drive  Diagnosis / Impression:  OD: NFP; no IRF/SRF. Scattered vitreous opacities. Irregular RPE contour temporally - consistent w/ choroidal effusion.  OS: NFP; no IRF/SRF    Clinical  management:  See below  Abbreviations: NFP - Normal foveal profile. CME - cystoid macular edema. PED - pigment epithelial detachment. IRF - intraretinal fluid. SRF - subretinal fluid. EZ - ellipsoid zone. ERM - epiretinal membrane. ORA - outer retinal atrophy. ORT - outer retinal tubulation. SRHM - subretinal hyper-reflective material. IRHM - intraretinal hyper-reflective material      B-Scan Ultrasound - OD - Right Eye       Quality was good. Findings included choroidal detachment, vitreous hemorrhage, vitreous opacities.   Notes **Images stored on drive**  Impression: OD: vitreous opacities consistent with hemorrhage; choroidal effusion temporal periphery; no obvious RT/RD or mass           ASSESSMENT/PLAN:   ICD-10-CM   1. Vitreous hemorrhage of right eye (HCC)  H43.11 OCT, Retina - OU - Both Eyes    B-Scan Ultrasound - OD - Right Eye    2. Choroidal effusion  H31.8 B-Scan Ultrasound - OD - Right Eye   OD    3. 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 Diabetes mellitus treated with injections of non-insulin medication (HCC)  E11.9    Z79.85     5. Essential hypertension  I10     6. Hypertensive retinopathy of both eyes  H35.033     7. Pseudophakia of right eye  Z96.1     8. Combined forms of age-related cataract of left eye  H25.812      S/p CEIOL OD on 07.25.25 complicated by ant vitreous prolapse and underwent limited anterior vitrectomy. - developed choroidal effusion after surgery  1. Vitreous hemorrhage OD  - diffuse VH following cataract surgery  - b-scan 08.08.25 shows diffuse vit opacities consistent with VH, no obvious RT/RD; +choroidal effusion (see below)  - VH precautions reviewed -- minimize activities, keep head elevated, avoid ASA/NSAIDs/blood thinners as able   - monitor for now - f/u 2-3 wks  2. Choroidal Effusion OD  - OCT OD shows NFP, no IRF/SRF centrally. Scattered vitreous opacities. Irregular RPE contour  temporally, consistent w/ choroidal effusion  - b-scan 08.08.25 also shows +choroidal effusion temporal periphery  - continue OSRX drop QID OD  - would recommend additional PredForte QID OD  - Recommend upright positioning as much as possible, continue gtt regimen as above.  - pt reports significant eye pain / discomfort -- can use Norco q4h prn  - discussed case with Dr. Juli -- Dr. Juli to f/u with pt next week  - f/u here in 2-3 wks, DFE, OCT  3,4. Diabetes mellitus, type 2 without retinopathy - The incidence, risk factors for progression, natural history and treatment options for diabetic retinopathy  were discussed with patient.   - The need for close monitoring of blood glucose, blood pressure, and serum lipids, avoiding cigarette  or any type of tobacco, and the need for long term follow up was also discussed with patient. - f/u in 1 year, sooner prn   5,6. Hypertensive retinopathy OU - discussed importance of tight BP control - monitor   7. Pseudophakia OD  - s/p CE/IOL OD  - IOL in good position -- VH and choroidal effusion (see above)  - monitor   8. Mixed Cataract OS - The symptoms of cataract, surgical options, and treatments and risks were discussed with patient. - discussed diagnosis and progression - under the expert management of Dr. Juli  Ophthalmic Meds Ordered this visit:  Meds ordered this encounter  Medications   DISCONTD: HYDROcodone -acetaminophen  (NORCO/VICODIN) 5-325 MG tablet    Sig: Take 1 tablet by mouth every 6 (six) hours as needed for moderate pain (pain score 4-6).    Dispense:  30 tablet    Refill:  0   HYDROcodone -acetaminophen  (NORCO/VICODIN) 5-325 MG tablet    Sig: Take 1 tablet by mouth every 6 (six) hours as needed for moderate pain (pain score 4-6).    Dispense:  30 tablet    Refill:  0     Return for 2-3wks VH OD, DFE, OCT.  There are no Patient Instructions on file for this visit.  Explained the diagnoses, plan, and follow up  with the patient and they expressed understanding.  Patient expressed understanding of the importance of proper follow up care.   [place]ell JUDITHANN Hans, M.D., Ph.D. Diseases & Surgery of the Retina and Vitreous [facility] 06/09/2024  I have reviewed the above documentation for accuracy and completeness, and I agree with the above. Redell JUDITHANN Hans, M.D., Ph.D. 06/13/24 9:56 PM   Abbreviations: M myopia (nearsighted); A astigmatism; H hyperopia (farsighted); P presbyopia; Mrx spectacle prescription;  CTL contact lenses; OD right eye; OS left eye; OU both eyes  XT exotropia; ET esotropia; PEK punctate epithelial keratitis; PEE punctate epithelial erosions; DES dry eye syndrome; MGD meibomian gland dysfunction; ATs artificial tears; PFAT's preservative free artificial tears; NSC nuclear sclerotic cataract; PSC posterior subcapsular cataract; ERM epi-retinal membrane; PVD posterior vitreous detachment; RD retinal detachment; DM diabetes mellitus; DR diabetic retinopathy; NPDR non-proliferative diabetic retinopathy; PDR proliferative diabetic retinopathy; CSME clinically significant macular edema; DME diabetic macular edema; dbh dot blot hemorrhages; CWS cotton wool spot; POAG primary open angle glaucoma; C/D cup-to-disc ratio; HVF humphrey visual field; GVF goldmann visual field; OCT optical coherence tomography; IOP intraocular pressure; BRVO Branch retinal vein occlusion; CRVO central retinal vein occlusion; CRAO central retinal artery occlusion; BRAO branch retinal artery occlusion; RT retinal tear; SB scleral buckle; PPV pars plana vitrectomy; VH Vitreous hemorrhage; PRP panretinal laser photocoagulation; IVK intravitreal kenalog; VMT vitreomacular traction; MH Macular hole;  NVD neovascularization of the disc; NVE neovascularization elsewhere; AREDS age related eye disease study; ARMD age related macular degeneration; POAG primary open angle glaucoma; EBMD epithelial/anterior basement  membrane dystrophy; ACIOL anterior chamber intraocular lens; IOL intraocular lens; PCIOL posterior chamber intraocular lens; Phaco/IOL phacoemulsification with intraocular lens placement; PRK photorefractive keratectomy; LASIK laser assisted in situ keratomileusis; HTN hypertension; DM diabetes mellitus; COPD chronic obstructive pulmonary disease

## 2024-06-22 NOTE — Progress Notes (Signed)
 [facility] - Clinic Note  06/28/2024   CHIEF COMPLAINT Patient presents for Retina Follow Up  HISTORY OF PRESENT ILLNESS: Eric Dougherty is a 73 y.o. male who presents to the clinic today for:  HPI     Retina Follow Up   In right eye.  This started 3.  Duration of 3 weeks.  Since onset it is stable.  I, the attending physician,  performed the HPI with the patient and updated documentation appropriately.        Comments   3 week retina follow up VH OD pt is reporting he is photo phobic today but vision seems about the same he is diabetic and is not currently checking his blood sugar at this time pt is using PF qid OD and moxi qid OD       Last edited by [name], MD on 07/01/2024  2:27 PM.      Pt states  Referring physician: [name], MD [address] [place],  [place] [address]  HISTORICAL INFORMATION:  Selected notes from the MEDICAL RECORD NUMBER Referred by Dr. Juli for [facility] and choroidal effusion OD following cataract surgery LEE:  Ocular Hx- PMH-   CURRENT MEDICATIONS: Current Outpatient Medications (Ophthalmic Drugs)  Medication Sig   dorzolamide-timolol (COSOPT) 2-0.5 % ophthalmic solution Place 1 drop into both eyes 2 (two) times daily.   latanoprost (XALATAN) 0.005 % ophthalmic solution Place 1 drop into both eyes at bedtime.   No current facility-administered medications for this visit. (Ophthalmic Drugs)   Current Outpatient Medications (Other)  Medication Sig   aspirin EC 81 MG tablet Take 81 mg by mouth daily.   atorvastatin  (LIPITOR) 40 MG tablet TAKE 1 TABLET ONCE DAILY. (Patient taking differently: Take 40 mg by mouth daily.)   Cholecalciferol (VITAMIN D3) 50 MCG (2000 UT) TABS Take 2,000 Units by mouth daily.   empagliflozin (JARDIANCE) 25 MG TABS tablet Take 25 mg by mouth daily.   famotidine (PEPCID) 20 MG tablet Take 20 mg by mouth daily.   folic acid (FOLVITE) 1 MG tablet Take 1,000 mcg by mouth daily.    HYDROcodone -acetaminophen  (NORCO/VICODIN) 5-325 MG tablet Take 1 tablet by mouth every 6 (six) hours as needed for moderate pain (pain score 4-6).   Ibuprofen -Acetaminophen  (ADVIL  DUAL ACTION) 125-250 MG TABS Take 1 tablet by mouth daily as needed (Pain).   lisinopril (ZESTRIL) 10 MG tablet Take 10 mg by mouth daily.   naproxen sodium (ALEVE) 220 MG tablet Take 440 mg by mouth daily as needed (pain).   OZEMPIC, 0.25 OR 0.5 MG/DOSE, 2 MG/3ML SOPN Inject 2 mg into the skin once a week.   tadalafil (CIALIS) 5 MG tablet Take 5 mg by mouth daily.   tamsulosin (FLOMAX) 0.4 MG CAPS capsule Take 0.4 mg by mouth daily.    thiamine (VITAMIN B-1) 100 MG tablet Take 100 mg by mouth daily.   vitamin C (ASCORBIC ACID) 500 MG tablet Take 500 mg by mouth daily.   zinc gluconate 50 MG tablet Take 50 mg by mouth daily.   No current facility-administered medications for this visit. (Other)   REVIEW OF SYSTEMS: ROS   Positive for: Eyes Last edited by Resa Delon LELON, COT on 06/28/2024  8:53 AM.      ALLERGIES No Known Allergies PAST MEDICAL HISTORY Past Medical History:  Diagnosis Date   Cirrhosis (HCC)    Diabetes mellitus    resolution of symptoms   Hypertension    no longer  on meds   PONV (postoperative nausea and vomiting)    Sciatica    Sleep apnea    Past Surgical History:  Procedure Laterality Date   ANTERIOR VITRECTOMY Right 05/26/2024   Procedure: VITRECTOMY, ANTERIOR;  Surgeon: [name], MD;  Location: [facility];  Service: Ophthalmology;  Laterality: Right;   BIOPSY  11/09/2018   Procedure: BIOPSY;  Surgeon: Shaaron Lamar HERO, MD;  Location: [facility];  Service: Endoscopy;;  gastric    CATARACT EXTRACTION W/PHACO Right 05/26/2024   Procedure: PHACOEMULSIFICATION, CATARACT, WITH IOL INSERTION;  Surgeon: [name], MD;  Location: [facility];  Service: Ophthalmology;  Laterality: Right;  CDE: 4.08   COLONOSCOPY  11/20/2005   RMR: Minimal hemorrhoids, otherwise normal    COLONOSCOPY N/A 03/25/2016   three semi-pedunculated polyps 4-6 mm in size, multiple medium-mouthed sigmoid diverticula (hyperplastic).    COLONOSCOPY WITH PROPOFOL  N/A 01/02/2021   normal   ESOPHAGOGASTRODUODENOSCOPY N/A 11/09/2018   11/09/2018 with erosive reflux esophagitis, erythematous mucosa in the stomach status post biopsy found to be mild chronic gastritis.   ESOPHAGOGASTRODUODENOSCOPY N/A 04/26/2024   Procedure: EGD (ESOPHAGOGASTRODUODENOSCOPY);  Surgeon: Shaaron Lamar HERO, MD;  Location: [facility];  Service: Endoscopy;  Laterality: N/A;  11:00 AM , ASA 3   ESOPHAGOGASTRODUODENOSCOPY (EGD) WITH PROPOFOL  N/A 01/02/2021   normal esophagus, portal gastropathy that was mild, normal duodenum.   HEMORRHOID SURGERY     HERNIA REPAIR     INSERTION, STENT, DRUG-ELUTING, LACRIMAL CANALICULUS Right 05/26/2024   Procedure: INSERTION, STENT, DRUG-ELUTING, LACRIMAL CANALICULUS;  Surgeon: [name], MD;  Location: [facility];  Service: Ophthalmology;  Laterality: Right;   LITHOTRIPSY     TONSILLECTOMY     TOOTH EXTRACTION     FAMILY HISTORY Family History  Problem Relation Age of Onset   Leukemia Father    Hypothyroidism Brother    Glaucoma Brother    Rectal cancer Brother 12       s/p surgical resection; dx 2 years ago   Osteoarthritis Other    Prostate cancer Paternal Grandfather    Cirrhosis Cousin    SOCIAL HISTORY Social History   Tobacco Use   Smoking status: Former   Smokeless tobacco: Former    Types: Chew    Quit date: 11/02/1978  Vaping Use   Vaping status: Never Used  Substance Use Topics   Alcohol use: Not Currently    Alcohol/week: 7.0 standard drinks of alcohol    Types: 7 Standard drinks or equivalent per week    Comment: 2-3 at a time; 2-3 times a week; previously: none in 2019 but did have 1 drink in June on vacation.   Drug use: No       OPHTHALMIC EXAM:  Base Eye Exam     Visual Acuity (Snellen - Linear)       Right Left   Dist cc 20/400 20/25    Dist ph cc  NI         Tonometry (Tonopen, 8:58 AM)       Right Left   Pressure 36 32  Brimonidine/cosopt OU.        Pupils       Pupils Dark Light Shape React APD   Right PERRL 5 5 Round NR None   Left PERRL 3 2 Round Brisk None         Visual Fields       Left Right    Full    Restrictions  Total superior temporal, inferior temporal, superior nasal,  inferior nasal deficiencies         Neuro/Psych     Oriented x3: Yes   Mood/Affect: Normal         Dilation     Both eyes: 2.5% Phenylephrine  @ 8:58 AM           Slit Lamp and Fundus Exam     External Exam       Right Left   External Normal Normal         Slit Lamp Exam       Right Left   Lids/Lashes Dermatochalasis Dermatochalasis, mild Ptosis   Conjunctiva/Sclera White and quiet, temporal pinguecula White and quiet   Cornea Trace PEE, mild tear film debri, Well healed temporal cataract wound trace tear film debris   Anterior Chamber Deep, narrow angle Deep, narrow temporal angle   Iris Peaked pupil to 630, well dilated, No NVI Round and dilated, no NVI   Lens PC IOL in good position 2+ Cortical cataract, 2+ Nuclear sclerosis   Anterior Vitreous Vitreous syneresis, +RBC's, vitreous to corneal wound at 630, diffuse Vh clearing and settling inferiorly Vitreous syneresis         Fundus Exam       Right Left   Disc Hazy view, mild pallor, sharp rim Pink and Sharp   C/D Ratio 0.4 0.6   Macula Hazy view, grossly flat Flat, Blunted foveal reflex, RPE mottling, no heme or edema   Vessels Hazy, view, Vascular attenuation, Tortuous Attenuated, mild tortuosity   Periphery Hazy view, choroidal improved Attached, no heme           Refraction     Wearing Rx       Sphere Cylinder Axis Add   Right +0.25 +1.25 167 +2.25   Left -0.50 +2.00 179 +2.25           IMAGING AND PROCEDURES  Imaging and Procedures for 06/28/2024  OCT, Retina - OU - Both Eyes       Right Eye Quality was  poor. Progression has no prior data. Findings include normal foveal contour, no IRF, no SRF (NFP, no IRF no SRF. Scattered vitreous opacities. Irregular RPE contour ).   Left Eye Quality was good. Central Foveal Thickness: 284. Progression has been stable. Findings include normal foveal contour, no IRF, no SRF, vitreomacular adhesion .   Notes *Images captured and stored on drive  Diagnosis / Impression:  OD: NFP; no IRF/SRF. Scattered vitreous opacities. Irregular RPE contour temporally  OS: NFP; no IRF/SRF    Clinical management:  See below  Abbreviations: NFP - Normal foveal profile. CME - cystoid macular edema. PED - pigment epithelial detachment. IRF - intraretinal fluid. SRF - subretinal fluid. EZ - ellipsoid zone. ERM - epiretinal membrane. ORA - outer retinal atrophy. ORT - outer retinal tubulation. SRHM - subretinal hyper-reflective material. IRHM - intraretinal hyper-reflective material            ASSESSMENT/PLAN:   ICD-10-CM   1. Vitreous hemorrhage of right eye (HCC)  H43.11 OCT, Retina - OU - Both Eyes    2. Choroidal effusion  H31.8     3. 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 Diabetes mellitus treated with injections of non-insulin medication (HCC)  E11.9    Z79.85     5. Essential hypertension  I10     6. Hypertensive retinopathy of both eyes  H35.033     7. Pseudophakia of right eye  Z96.1  8. Combined forms of age-related cataract of left eye  H25.812      **S/p CEIOL OD on 07.25.25 complicated by ant vitreous prolapse and underwent limited anterior vitrectomy. - developed choroidal effusion and vit hemorrhage after surgery  1. Vitreous hemorrhage OD  - diffuse VH following cataract surgery - b-scan 08.08.25 shows diffuse vit opacities consistent with VH, no obvious RT/RD; +choroidal effusion (see below) - VH precautions reviewed -- minimize activities, keep head elevated, avoid ASA/NSAIDs/blood thinners as able   -  monitor for now - f/u 2-3 wks  2. Choroidal Effusion OD - OCT OD shows NFP, no IRF/SRF centrally. Scattered vitreous opacities. Irregular RPE contour temporally improved - b-scan 08.08.25 also shows +choroidal effusion temporal periphery - Per Dr. Luiz; Moxi OD QID, PredForte QID OD, Brim OS TID, Timolol OD BID, Latanoprost OU at bedtime, Dorz OU TID, and Atropine OD BID - restart Diamox  PO 2 pills BID due to elevated eye pressure - Recommend upright positioning as much as possible, continue gtt regimen as above. - pt reports significant eye pain / discomfort that has improved with sleeping with head elevated - Dr. Juli to f/u with pt next week, 09.02.25  - f/u here in 3 wks, DFE, OCT  3,4. Diabetes mellitus, type 2 without retinopathy - The incidence, risk factors for progression, natural history and treatment options for diabetic retinopathy were discussed with patient.   - The need for close monitoring of blood glucose, blood pressure, and serum lipids, avoiding cigarette or any type of tobacco, and the need for long term follow up was also discussed with patient. - f/u in 1 year, sooner prn   5,6. Hypertensive retinopathy OU - discussed importance of tight BP control - monitor   7. Pseudophakia OD  - s/p CE/IOL OD  - IOL in good position -- VH and choroidal effusion (see above)  - monitor   8. Mixed Cataract OS - The symptoms of cataract, surgical options, and treatments and risks were discussed with patient. - discussed diagnosis and progression - under the expert management of Dr. Juli  Ophthalmic Meds Ordered this visit:  No orders of the defined types were placed in this encounter.    Return in about 3 weeks (around 07/19/2024) for f/u Choroidal Effusion OD , DFE, OCT.  There are no Patient Instructions on file for this visit.  Explained the diagnoses, plan, and follow up with the patient and they expressed understanding.  Patient expressed understanding of the  importance of proper follow up care.   This document serves as a record of services personally performed by Redell JUDITHANN Hans, MD, PhD. It was created on their behalf by Wanda GEANNIE Keens, COT an ophthalmic technician. The creation of this record is the provider's dictation and/or activities during the visit.    Electronically signed by:  Wanda GEANNIE Keens, COT  07/01/24 2:40 PM  Redell JUDITHANN Hans, M.D., Ph.D. Diseases & Surgery of the Retina and Vitreous [facility] 06/28/2024  I have reviewed the above documentation for accuracy and completeness, and I agree with the above. Redell JUDITHANN Hans, M.D., Ph.D. 07/01/24 2:42 PM  Abbreviations: M myopia (nearsighted); A astigmatism; H hyperopia (farsighted); P presbyopia; Mrx spectacle prescription;  CTL contact lenses; OD right eye; OS left eye; OU both eyes  XT exotropia; ET esotropia; PEK punctate epithelial keratitis; PEE punctate epithelial erosions; DES dry eye syndrome; MGD meibomian gland dysfunction; ATs artificial tears; PFAT's preservative free artificial tears; NSC nuclear sclerotic cataract;  PSC posterior subcapsular cataract; ERM epi-retinal membrane; PVD posterior vitreous detachment; RD retinal detachment; DM diabetes mellitus; DR diabetic retinopathy; NPDR non-proliferative diabetic retinopathy; PDR proliferative diabetic retinopathy; CSME clinically significant macular edema; DME diabetic macular edema; dbh dot blot hemorrhages; CWS cotton wool spot; POAG primary open angle glaucoma; C/D cup-to-disc ratio; HVF humphrey visual field; GVF goldmann visual field; OCT optical coherence tomography; IOP intraocular pressure; BRVO Branch retinal vein occlusion; CRVO central retinal vein occlusion; CRAO central retinal artery occlusion; BRAO branch retinal artery occlusion; RT retinal tear; SB scleral buckle; PPV pars plana vitrectomy; VH Vitreous hemorrhage; PRP panretinal laser photocoagulation; IVK intravitreal kenalog; VMT  vitreomacular traction; MH Macular hole;  NVD neovascularization of the disc; NVE neovascularization elsewhere; AREDS age related eye disease study; ARMD age related macular degeneration; POAG primary open angle glaucoma; EBMD epithelial/anterior basement membrane dystrophy; ACIOL anterior chamber intraocular lens; IOL intraocular lens; PCIOL posterior chamber intraocular lens; Phaco/IOL phacoemulsification with intraocular lens placement; PRK photorefractive keratectomy; LASIK laser assisted in situ keratomileusis; HTN hypertension; DM diabetes mellitus; COPD chronic obstructive pulmonary disease

## 2024-06-28 ENCOUNTER — Encounter (INDEPENDENT_AMBULATORY_CARE_PROVIDER_SITE_OTHER): Payer: Self-pay | Admitting: Ophthalmology

## 2024-06-28 ENCOUNTER — Ambulatory Visit (INDEPENDENT_AMBULATORY_CARE_PROVIDER_SITE_OTHER): Admitting: Ophthalmology

## 2024-06-28 DIAGNOSIS — I10 Essential (primary) hypertension: Secondary | ICD-10-CM

## 2024-06-28 DIAGNOSIS — H25812 Combined forms of age-related cataract, left eye: Secondary | ICD-10-CM

## 2024-06-28 DIAGNOSIS — H35033 Hypertensive retinopathy, bilateral: Secondary | ICD-10-CM

## 2024-06-28 DIAGNOSIS — E119 Type 2 diabetes mellitus without complications: Secondary | ICD-10-CM

## 2024-06-28 DIAGNOSIS — Z7985 Long-term (current) use of injectable non-insulin antidiabetic drugs: Secondary | ICD-10-CM

## 2024-06-28 DIAGNOSIS — H318 Other specified disorders of choroid: Secondary | ICD-10-CM

## 2024-06-28 DIAGNOSIS — Z961 Presence of intraocular lens: Secondary | ICD-10-CM | POA: Diagnosis not present

## 2024-06-28 DIAGNOSIS — H4311 Vitreous hemorrhage, right eye: Secondary | ICD-10-CM | POA: Diagnosis not present

## 2024-07-01 ENCOUNTER — Encounter (INDEPENDENT_AMBULATORY_CARE_PROVIDER_SITE_OTHER): Payer: Self-pay | Admitting: Ophthalmology

## 2024-07-04 ENCOUNTER — Other Ambulatory Visit: Payer: Self-pay | Admitting: Optometry

## 2024-07-04 DIAGNOSIS — H40813 Glaucoma with increased episcleral venous pressure, bilateral: Secondary | ICD-10-CM

## 2024-07-04 NOTE — Progress Notes (Signed)
 Elevated Intraocular pressure with concern for elevated episcleral venous pressure. Will obtain Echocardiogram and CTA head and neck to further evaluate.   Marsa Cleverly, MD

## 2024-07-10 NOTE — Progress Notes (Signed)
 [facility] - Clinic Note  07/19/2024   CHIEF COMPLAINT Patient presents for Retina Follow Up  HISTORY OF PRESENT ILLNESS: Eric Dougherty is a 73 y.o. male who presents to the clinic today for:  HPI     Retina Follow Up   In right eye.  This started 3.  Duration of 3 weeks.  Since onset it is stable.  I, the attending physician,  performed the HPI with the patient and updated documentation appropriately.        Comments   Pt states about once a week he gets a parting in the clouds of his eye and he can see for a few minutes. Pt states typically the vision in his eye is like the tv show Twilight Zone. Pt states when reading [facility] chart through the pinhole he has to line up with each letter individually. Pt has been consistent with drops and has had the latanoprost changed out for Rocklatan per his last visit w/Dr Juli: Moxi OD BID, PredForte BID OD, Brim OD TID, Timolol OD BID, Rocklatan OU at bedtime, Dorz OU TID, and Atropine OD QAM.      Last edited by [name], MD on 07/29/2024 12:06 PM.     Pt states he sees Dr. Juli next Wednesday. He feels the vision is getting better.  Referring physician: [name], MD [address] [place],  [place] [address]  HISTORICAL INFORMATION:  Selected notes from the MEDICAL RECORD NUMBER Referred by Dr. Juli for [facility] and choroidal effusion OD following cataract surgery LEE:  Ocular Hx- PMH-   CURRENT MEDICATIONS: Current Outpatient Medications (Ophthalmic Drugs)  Medication Sig   dorzolamide-timolol (COSOPT) 2-0.5 % ophthalmic solution Place 1 drop into both eyes 2 (two) times daily.   latanoprost (XALATAN) 0.005 % ophthalmic solution Place 1 drop into both eyes at bedtime.   No current facility-administered medications for this visit. (Ophthalmic Drugs)   Current Outpatient Medications (Other)  Medication Sig   aspirin EC 81 MG tablet Take 81 mg by mouth daily.   atorvastatin  (LIPITOR) 40 MG  tablet TAKE 1 TABLET ONCE DAILY. (Patient taking differently: Take 40 mg by mouth daily.)   Cholecalciferol (VITAMIN D3) 50 MCG (2000 UT) TABS Take 2,000 Units by mouth daily.   empagliflozin (JARDIANCE) 25 MG TABS tablet Take 25 mg by mouth daily.   famotidine (PEPCID) 20 MG tablet Take 20 mg by mouth daily.   folic acid (FOLVITE) 1 MG tablet Take 1,000 mcg by mouth daily.   HYDROcodone -acetaminophen  (NORCO/VICODIN) 5-325 MG tablet Take 1 tablet by mouth every 6 (six) hours as needed for moderate pain (pain score 4-6).   Ibuprofen -Acetaminophen  (ADVIL  DUAL ACTION) 125-250 MG TABS Take 1 tablet by mouth daily as needed (Pain).   lisinopril (ZESTRIL) 10 MG tablet Take 10 mg by mouth daily.   naproxen sodium (ALEVE) 220 MG tablet Take 440 mg by mouth daily as needed (pain).   OZEMPIC, 0.25 OR 0.5 MG/DOSE, 2 MG/3ML SOPN Inject 2 mg into the skin once a week.   tadalafil (CIALIS) 5 MG tablet Take 5 mg by mouth daily.   tamsulosin (FLOMAX) 0.4 MG CAPS capsule Take 0.4 mg by mouth daily.    thiamine (VITAMIN B-1) 100 MG tablet Take 100 mg by mouth daily.   vitamin C (ASCORBIC ACID) 500 MG tablet Take 500 mg by mouth daily.   zinc gluconate 50 MG tablet Take 50 mg by mouth daily.   No current facility-administered medications for  this visit. (Other)   REVIEW OF SYSTEMS: ROS   Positive for: Eyes Last edited by Elnor Avelina RAMAN, COT on 07/19/2024  9:26 AM.     ALLERGIES No Known Allergies  PAST MEDICAL HISTORY Past Medical History:  Diagnosis Date   Cirrhosis (HCC)    Diabetes mellitus    resolution of symptoms   Hypertension    no longer on meds   PONV (postoperative nausea and vomiting)    Sciatica    Sleep apnea    Past Surgical History:  Procedure Laterality Date   ANTERIOR VITRECTOMY Right 05/26/2024   Procedure: VITRECTOMY, ANTERIOR;  Surgeon: [name], MD;  Location: [facility];  Service: Ophthalmology;  Laterality: Right;   BIOPSY  11/09/2018   Procedure: BIOPSY;   Surgeon: Shaaron Lamar HERO, MD;  Location: [facility];  Service: Endoscopy;;  gastric    CATARACT EXTRACTION W/PHACO Right 05/26/2024   Procedure: PHACOEMULSIFICATION, CATARACT, WITH IOL INSERTION;  Surgeon: [name], MD;  Location: [facility];  Service: Ophthalmology;  Laterality: Right;  CDE: 4.08   COLONOSCOPY  11/20/2005   RMR: Minimal hemorrhoids, otherwise normal   COLONOSCOPY N/A 03/25/2016   three semi-pedunculated polyps 4-6 mm in size, multiple medium-mouthed sigmoid diverticula (hyperplastic).    COLONOSCOPY WITH PROPOFOL  N/A 01/02/2021   normal   ESOPHAGOGASTRODUODENOSCOPY N/A 11/09/2018   11/09/2018 with erosive reflux esophagitis, erythematous mucosa in the stomach status post biopsy found to be mild chronic gastritis.   ESOPHAGOGASTRODUODENOSCOPY N/A 04/26/2024   Procedure: EGD (ESOPHAGOGASTRODUODENOSCOPY);  Surgeon: Shaaron Lamar HERO, MD;  Location: [facility];  Service: Endoscopy;  Laterality: N/A;  11:00 AM , ASA 3   ESOPHAGOGASTRODUODENOSCOPY (EGD) WITH PROPOFOL  N/A 01/02/2021   normal esophagus, portal gastropathy that was mild, normal duodenum.   HEMORRHOID SURGERY     HERNIA REPAIR     INSERTION, STENT, DRUG-ELUTING, LACRIMAL CANALICULUS Right 05/26/2024   Procedure: INSERTION, STENT, DRUG-ELUTING, LACRIMAL CANALICULUS;  Surgeon: [name], MD;  Location: [facility];  Service: Ophthalmology;  Laterality: Right;   LITHOTRIPSY     TONSILLECTOMY     TOOTH EXTRACTION     FAMILY HISTORY Family History  Problem Relation Age of Onset   Leukemia Father    Hypothyroidism Brother    Glaucoma Brother    Rectal cancer Brother 57       s/p surgical resection; dx 2 years ago   Osteoarthritis Other    Prostate cancer Paternal Grandfather    Cirrhosis Cousin    SOCIAL HISTORY Social History   Tobacco Use   Smoking status: Former   Smokeless tobacco: Former    Types: Chew    Quit date: 11/02/1978  Vaping Use   Vaping status: Never Used  Substance Use Topics    Alcohol use: Not Currently    Alcohol/week: 7.0 standard drinks of alcohol    Types: 7 Standard drinks or equivalent per week    Comment: 2-3 at a time; 2-3 times a week; previously: none in 2019 but did have 1 drink in June on vacation.   Drug use: No       OPHTHALMIC EXAM:  Base Eye Exam     Visual Acuity (Snellen - Linear)       Right Left   Dist cc 20/350 +1 20/25   Dist ph cc 20/70 +2 NI         Tonometry (Tonopen, 9:34 AM)       Right Left   Pressure 25 18  Pupils       Pupils Dark Light Shape React APD   Right PERRL 5 5 Round NR None   Left PERRL 3 2 Round Brisk NR         Visual Fields       Left Right    Full    Restrictions  Total superior temporal, inferior temporal, superior nasal, inferior nasal deficiencies         Extraocular Movement       Right Left    Full, Ortho Full, Ortho         Neuro/Psych     Oriented x3: Yes   Mood/Affect: Normal         Dilation     Left eye: 1.0% Mydriacyl , 2.5% Phenylephrine  @ 9:35 AM           Slit Lamp and Fundus Exam     External Exam       Right Left   External Normal Normal         Slit Lamp Exam       Right Left   Lids/Lashes Dermatochalasis Dermatochalasis, mild Ptosis   Conjunctiva/Sclera White and quiet, temporal pinguecula, 1+ Injection White and quiet   Cornea Trace PEE, mild tear film debri, Well healed temporal cataract wound trace tear film debris   Anterior Chamber Deep, narrow angle, 1+ fine Cell/pigment Deep, narrow temporal angle   Iris Peaked pupil to 630, well dilated, No NVI Round and dilated, no NVI   Lens PC IOL in good position, trace Posterior capsular opacification 2+ Cortical cataract, 2+ Nuclear sclerosis   Anterior Vitreous Vitreous syneresis, +RBC's -improving, vitreous to corneal wound at 630, diffuse Vh clearing and settling inferiorly Vitreous syneresis         Fundus Exam       Right Left   Disc mild pallor, sharp rim Pink and Sharp    C/D Ratio 0.4 0.6   Macula Hazy view improved, grossly flat, Blunted foveal reflex, RPE mottling, No heme or edema Flat, Blunted foveal reflex, RPE mottling, no heme or edema   Vessels Vascular attenuation, Tortuous Attenuated, mild tortuosity   Periphery Hazy view, choroidal improved Attached, no heme           Refraction     Wearing Rx       Sphere Cylinder Axis Add   Right +0.25 +1.25 167 +2.25   Left -0.50 +2.00 179 +2.25           IMAGING AND PROCEDURES  Imaging and Procedures for 07/19/2024  OCT, Retina - OU - Both Eyes       Right Eye Quality was poor. Central Foveal Thickness: 311. Progression has improved. Findings include normal foveal contour, no IRF, no SRF (NFP, no IRF no SRF. Scattered vitreous opacities- improved. Irregular RPE contour ).   Left Eye Quality was good. Central Foveal Thickness: 280. Progression has been stable. Findings include normal foveal contour, no IRF, no SRF, vitreomacular adhesion .   Notes *Images captured and stored on drive  Diagnosis / Impression:  OD: NFP; no IRF/SRF. Scattered vitreous opacities- improved. Irregular RPE contour temporally  OS: NFP; no IRF/SRF   Clinical management:  See below  Abbreviations: NFP - Normal foveal profile. CME - cystoid macular edema. PED - pigment epithelial detachment. IRF - intraretinal fluid. SRF - subretinal fluid. EZ - ellipsoid zone. ERM - epiretinal membrane. ORA - outer retinal atrophy. ORT - outer retinal tubulation. SRHM - subretinal hyper-reflective material. IRHM -  intraretinal hyper-reflective material           ASSESSMENT/PLAN:   ICD-10-CM   1. Vitreous hemorrhage of right eye (HCC)  H43.11 OCT, Retina - OU - Both Eyes    2. Choroidal effusion  H31.8     3. 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 Diabetes mellitus treated with injections of non-insulin medication (HCC)  E11.9    Z79.85     5. Essential hypertension  I10     6.  Hypertensive retinopathy of both eyes  H35.033     7. Pseudophakia of right eye  Z96.1     8. Combined forms of age-related cataract of left eye  H25.812      **S/p CEIOL OD on 07.25.25 complicated by ant vitreous prolapse and underwent limited anterior vitrectomy. - developed choroidal effusion and vit hemorrhage after surgery  1. Vitreous hemorrhage OD  - diffuse VH following cataract surgery - b-scan 08.08.25 shows diffuse vit opacities consistent with VH, no obvious RT/RD; +choroidal effusion (see below) - VH precautions reviewed -- minimize activities, keep head elevated, avoid ASA/NSAIDs/blood thinners as able   - monitor for now - f/u 3-4 wks, DFE, OCT  2. Choroidal Effusion OD - OCT OD shows NFP, no IRF/SRF centrally. Scattered vitreous opacities. Irregular RPE contour temporally improved - b-scan 08.08.25 also shows +choroidal effusion temporal periphery - Per Dr. Luiz; Moxi OD QID, PredForte QID OD, Brim OS TID, Timolol OD BID, Rocklatan OU QHS, Dorz OU TID, and Atropine OD BID - stopped Diamox  PO 2 pills BID due to sickness - Recommend upright positioning as much as possible, continue gtt regimen as above. - pt reports significant eye pain / discomfort that has improved with sleeping with head elevated - Dr. Juli to f/u with pt next week  - f/u here in 3-4 wks, DFE, OCT  3,4. Diabetes mellitus, type 2 without retinopathy - The incidence, risk factors for progression, natural history and treatment options for diabetic retinopathy were discussed with patient.   - The need for close monitoring of blood glucose, blood pressure, and serum lipids, avoiding cigarette or any type of tobacco, and the need for long term follow up was also discussed with patient. - f/u in 1 year, sooner prn   5,6. Hypertensive retinopathy OU - discussed importance of tight BP control - monitor   7. Pseudophakia OD  - s/p CE/IOL OD  - IOL in good position -- VH and choroidal effusion (see  above)  - monitor   8. Mixed Cataract OS - The symptoms of cataract, surgical options, and treatments and risks were discussed with patient. - discussed diagnosis and progression - under the expert management of Dr. Juli  Ophthalmic Meds Ordered this visit:  No orders of the defined types were placed in this encounter.    Return in about 4 weeks (around 08/16/2024) for f/u VH , OD, DFE, OCT.  There are no Patient Instructions on file for this visit.  Explained the diagnoses, plan, and follow up with the patient and they expressed understanding.  Patient expressed understanding of the importance of proper follow up care.   This document serves as a record of services personally performed by Redell JUDITHANN Hans, MD, PhD. It was created on their behalf by Almetta Pesa, an ophthalmic technician. The creation of this record is the provider's dictation and/or activities during the visit.    Electronically signed by: Almetta Pesa, OA, 07/29/24  12:07 PM  This document serves as a record of services personally performed by Redell JUDITHANN Hans, MD, PhD. It was created on their behalf by Wanda GEANNIE Keens, COT an ophthalmic technician. The creation of this record is the provider's dictation and/or activities during the visit.    Electronically signed by:  Wanda GEANNIE Keens, COT  07/29/24 12:07 PM  Redell JUDITHANN Hans, M.D., Ph.D. Diseases & Surgery of the Retina and Vitreous [facility] 07/19/2024  I have reviewed the above documentation for accuracy and completeness, and I agree with the above. Redell JUDITHANN Hans, M.D., Ph.D. 07/29/24 12:11 PM   Abbreviations: M myopia (nearsighted); A astigmatism; H hyperopia (farsighted); P presbyopia; Mrx spectacle prescription;  CTL contact lenses; OD right eye; OS left eye; OU both eyes  XT exotropia; ET esotropia; PEK punctate epithelial keratitis; PEE punctate epithelial erosions; DES dry eye syndrome; MGD meibomian gland  dysfunction; ATs artificial tears; PFAT's preservative free artificial tears; NSC nuclear sclerotic cataract; PSC posterior subcapsular cataract; ERM epi-retinal membrane; PVD posterior vitreous detachment; RD retinal detachment; DM diabetes mellitus; DR diabetic retinopathy; NPDR non-proliferative diabetic retinopathy; PDR proliferative diabetic retinopathy; CSME clinically significant macular edema; DME diabetic macular edema; dbh dot blot hemorrhages; CWS cotton wool spot; POAG primary open angle glaucoma; C/D cup-to-disc ratio; HVF humphrey visual field; GVF goldmann visual field; OCT optical coherence tomography; IOP intraocular pressure; BRVO Branch retinal vein occlusion; CRVO central retinal vein occlusion; CRAO central retinal artery occlusion; BRAO branch retinal artery occlusion; RT retinal tear; SB scleral buckle; PPV pars plana vitrectomy; VH Vitreous hemorrhage; PRP panretinal laser photocoagulation; IVK intravitreal kenalog; VMT vitreomacular traction; MH Macular hole;  NVD neovascularization of the disc; NVE neovascularization elsewhere; AREDS age related eye disease study; ARMD age related macular degeneration; POAG primary open angle glaucoma; EBMD epithelial/anterior basement membrane dystrophy; ACIOL anterior chamber intraocular lens; IOL intraocular lens; PCIOL posterior chamber intraocular lens; Phaco/IOL phacoemulsification with intraocular lens placement; PRK photorefractive keratectomy; LASIK laser assisted in situ keratomileusis; HTN hypertension; DM diabetes mellitus; COPD chronic obstructive pulmonary disease

## 2024-07-19 ENCOUNTER — Encounter (INDEPENDENT_AMBULATORY_CARE_PROVIDER_SITE_OTHER): Payer: Self-pay | Admitting: Ophthalmology

## 2024-07-19 ENCOUNTER — Ambulatory Visit (INDEPENDENT_AMBULATORY_CARE_PROVIDER_SITE_OTHER): Admitting: Ophthalmology

## 2024-07-19 DIAGNOSIS — H318 Other specified disorders of choroid: Secondary | ICD-10-CM

## 2024-07-19 DIAGNOSIS — H4311 Vitreous hemorrhage, right eye: Secondary | ICD-10-CM | POA: Diagnosis not present

## 2024-07-19 DIAGNOSIS — H25812 Combined forms of age-related cataract, left eye: Secondary | ICD-10-CM | POA: Diagnosis not present

## 2024-07-19 DIAGNOSIS — I10 Essential (primary) hypertension: Secondary | ICD-10-CM

## 2024-07-19 DIAGNOSIS — Z7985 Long-term (current) use of injectable non-insulin antidiabetic drugs: Secondary | ICD-10-CM | POA: Diagnosis not present

## 2024-07-19 DIAGNOSIS — Z961 Presence of intraocular lens: Secondary | ICD-10-CM | POA: Diagnosis not present

## 2024-07-19 DIAGNOSIS — H35033 Hypertensive retinopathy, bilateral: Secondary | ICD-10-CM

## 2024-07-19 DIAGNOSIS — E119 Type 2 diabetes mellitus without complications: Secondary | ICD-10-CM

## 2024-07-29 ENCOUNTER — Encounter (INDEPENDENT_AMBULATORY_CARE_PROVIDER_SITE_OTHER): Payer: Self-pay | Admitting: Ophthalmology

## 2024-08-07 ENCOUNTER — Ambulatory Visit (HOSPITAL_COMMUNITY)
Admission: RE | Admit: 2024-08-07 | Discharge: 2024-08-07 | Disposition: A | Discharge status: Home / Self Care | Source: Ambulatory Visit | Attending: Cardiology | Admitting: Cardiology

## 2024-08-07 DIAGNOSIS — I10 Essential (primary) hypertension: Secondary | ICD-10-CM

## 2024-08-07 DIAGNOSIS — H40813 Glaucoma with increased episcleral venous pressure, bilateral: Secondary | ICD-10-CM | POA: Insufficient documentation

## 2024-08-07 LAB — ECHOCARDIOGRAM COMPLETE
Area-P 1/2: 2.91 cm2
S' Lateral: 3.11 cm

## 2024-08-07 MED ORDER — PERFLUTREN LIPID MICROSPHERE
1.0000 mL | INTRAVENOUS | Status: AC | PRN
  Administered 2024-08-07: 3 mL via INTRAVENOUS

## 2024-08-08 NOTE — Progress Notes (Signed)
 [facility] - Clinic Note  08/16/2024   CHIEF COMPLAINT Patient presents for Retina Follow Up  HISTORY OF PRESENT ILLNESS: Eric Dougherty is a 73 y.o. male who presents to the clinic today for:  HPI     Retina Follow Up   In right eye.  This started 3.  Duration of 3 weeks.  Since onset it is stable.  I, the attending physician,  performed the HPI with the patient and updated documentation appropriately.        Comments   Pt states everything is about the same as his last visit HPI but eye does seem noticeably less healthy, eye is more red, glossy, and cloudy. Pt had tooth pulled yesterday. Pt has been consistent with drops: Moxi OD BID, PredForte BID OD, Brim OD TID, Timolol OD BID, Rocklatan OU at bedtime, Dorz OU TID, and Atropine OD QAM.      Last edited by [name], MD on 08/20/2024  6:02 PM.    Pt states he saw Dr. Juli 1 wk after his last OV here. Pt was told Dr. Juli felt he could clear up some things w/ a laser, pt is hesitant. Wants Dr. Agnes opinion. Vision is limited, driving is difficult and so is something as simple as going up steps. Patient is frustrated w/ how long [facility] is taking to clear but doesn't want anything done unless its going to provide improvement. Pt had ECG per Dr. Juli.   Referring physician: [name], MD [address] [place],  [place] [address]  HISTORICAL INFORMATION:  Selected notes from the MEDICAL RECORD NUMBER Referred by Dr. Juli for [facility] and choroidal effusion OD following cataract surgery LEE:  Ocular Hx- PMH-   CURRENT MEDICATIONS: Current Outpatient Medications (Ophthalmic Drugs)  Medication Sig   dorzolamide-timolol (COSOPT) 2-0.5 % ophthalmic solution Place 1 drop into both eyes 2 (two) times daily.   latanoprost (XALATAN) 0.005 % ophthalmic solution Place 1 drop into both eyes at bedtime.   No current facility-administered medications for this visit. (Ophthalmic Drugs)   Current  Outpatient Medications (Other)  Medication Sig   aspirin EC 81 MG tablet Take 81 mg by mouth daily.   atorvastatin  (LIPITOR) 40 MG tablet TAKE 1 TABLET ONCE DAILY. (Patient taking differently: Take 40 mg by mouth daily.)   Cholecalciferol (VITAMIN D3) 50 MCG (2000 UT) TABS Take 2,000 Units by mouth daily.   empagliflozin (JARDIANCE) 25 MG TABS tablet Take 25 mg by mouth daily.   famotidine (PEPCID) 20 MG tablet Take 20 mg by mouth daily.   folic acid (FOLVITE) 1 MG tablet Take 1,000 mcg by mouth daily.   HYDROcodone -acetaminophen  (NORCO/VICODIN) 5-325 MG tablet Take 1 tablet by mouth every 6 (six) hours as needed for moderate pain (pain score 4-6).   Ibuprofen -Acetaminophen  (ADVIL  DUAL ACTION) 125-250 MG TABS Take 1 tablet by mouth daily as needed (Pain).   lisinopril (ZESTRIL) 10 MG tablet Take 10 mg by mouth daily.   naproxen sodium (ALEVE) 220 MG tablet Take 440 mg by mouth daily as needed (pain).   OZEMPIC, 0.25 OR 0.5 MG/DOSE, 2 MG/3ML SOPN Inject 2 mg into the skin once a week.   tadalafil (CIALIS) 5 MG tablet Take 5 mg by mouth daily.   tamsulosin (FLOMAX) 0.4 MG CAPS capsule Take 0.4 mg by mouth daily.    thiamine (VITAMIN B-1) 100 MG tablet Take 100 mg by mouth daily.   vitamin C (ASCORBIC ACID) 500 MG tablet Take 500 mg  by mouth daily.   zinc gluconate 50 MG tablet Take 50 mg by mouth daily.   No current facility-administered medications for this visit. (Other)   REVIEW OF SYSTEMS: ROS   Positive for: Eyes Last edited by Elnor Avelina RAMAN, COT on 08/16/2024  8:43 AM.     ALLERGIES No Known Allergies  PAST MEDICAL HISTORY Past Medical History:  Diagnosis Date   Cirrhosis (HCC)    Diabetes mellitus    resolution of symptoms   Hypertension    no longer on meds   PONV (postoperative nausea and vomiting)    Sciatica    Sleep apnea    Past Surgical History:  Procedure Laterality Date   ANTERIOR VITRECTOMY Right 05/26/2024   Procedure: VITRECTOMY, ANTERIOR;  Surgeon:  [name], MD;  Location: [facility];  Service: Ophthalmology;  Laterality: Right;   BIOPSY  11/09/2018   Procedure: BIOPSY;  Surgeon: Shaaron Lamar HERO, MD;  Location: [facility];  Service: Endoscopy;;  gastric    CATARACT EXTRACTION W/PHACO Right 05/26/2024   Procedure: PHACOEMULSIFICATION, CATARACT, WITH IOL INSERTION;  Surgeon: [name], MD;  Location: [facility];  Service: Ophthalmology;  Laterality: Right;  CDE: 4.08   COLONOSCOPY  11/20/2005   RMR: Minimal hemorrhoids, otherwise normal   COLONOSCOPY N/A 03/25/2016   three semi-pedunculated polyps 4-6 mm in size, multiple medium-mouthed sigmoid diverticula (hyperplastic).    COLONOSCOPY WITH PROPOFOL  N/A 01/02/2021   normal   ESOPHAGOGASTRODUODENOSCOPY N/A 11/09/2018   11/09/2018 with erosive reflux esophagitis, erythematous mucosa in the stomach status post biopsy found to be mild chronic gastritis.   ESOPHAGOGASTRODUODENOSCOPY N/A 04/26/2024   Procedure: EGD (ESOPHAGOGASTRODUODENOSCOPY);  Surgeon: Shaaron Lamar HERO, MD;  Location: [facility];  Service: Endoscopy;  Laterality: N/A;  11:00 AM , ASA 3   ESOPHAGOGASTRODUODENOSCOPY (EGD) WITH PROPOFOL  N/A 01/02/2021   normal esophagus, portal gastropathy that was mild, normal duodenum.   HEMORRHOID SURGERY     HERNIA REPAIR     INSERTION, STENT, DRUG-ELUTING, LACRIMAL CANALICULUS Right 05/26/2024   Procedure: INSERTION, STENT, DRUG-ELUTING, LACRIMAL CANALICULUS;  Surgeon: [name], MD;  Location: [facility];  Service: Ophthalmology;  Laterality: Right;   LITHOTRIPSY     TONSILLECTOMY     TOOTH EXTRACTION     FAMILY HISTORY Family History  Problem Relation Age of Onset   Leukemia Father    Hypothyroidism Brother    Glaucoma Brother    Rectal cancer Brother 45       s/p surgical resection; dx 2 years ago   Osteoarthritis Other    Prostate cancer Paternal Grandfather    Cirrhosis Cousin    SOCIAL HISTORY Social History   Tobacco Use   Smoking status: Former    Smokeless tobacco: Former    Types: Chew    Quit date: 11/02/1978  Vaping Use   Vaping status: Never Used  Substance Use Topics   Alcohol use: Not Currently    Alcohol/week: 7.0 standard drinks of alcohol    Types: 7 Standard drinks or equivalent per week    Comment: 2-3 at a time; 2-3 times a week; previously: none in 2019 but did have 1 drink in June on vacation.   Drug use: No       OPHTHALMIC EXAM:  Base Eye Exam     Visual Acuity (Snellen - Linear)       Right Left   Dist cc 20/800 20/25   Dist ph cc 20/80 -1 NI         Tonometry (  Tonopen, 8:50 AM)       Right Left   Pressure 22 19         Pupils       Pupils Dark Light Shape React APD   Right PERRL 6 6 Irregular NR None   Left PERRL 4 3 Round Brisk NR         Visual Fields (Toys)       Left Right    Full    Restrictions  Total superior temporal, inferior temporal, superior nasal, inferior nasal deficiencies         Extraocular Movement       Right Left    Full, Ortho Full, Ortho         Neuro/Psych     Oriented x3: Yes   Mood/Affect: Normal         Dilation     Both eyes: 1.0% Mydriacyl , 2.5% Phenylephrine  @ 8:52 AM           Slit Lamp and Fundus Exam     External Exam       Right Left   External Normal Normal         Slit Lamp Exam       Right Left   Lids/Lashes Dermatochalasis Dermatochalasis, mild Ptosis   Conjunctiva/Sclera White and quiet, temporal pinguecula White and quiet   Cornea Trace PEE, mild tear film debris, Well healed temporal cataract wound, mild EBMD superiorly trace tear film debris   Anterior Chamber Deep, narrow angle, 1+ fine Cell/pigment Deep, narrow temporal angle   Iris Peaked pupil to 630, well dilated, No NVI Round and dilated, no NVI   Lens PC IOL in good position, trace Posterior capsular opacification 2-3+ Cortical cataract, 2-3+ Nuclear sclerosis   Anterior Vitreous Vitreous syneresis, +RBC's -improving, vitreous to corneal wound at  630, diffuse VH clearing and settling inferiorly Vitreous syneresis         Fundus Exam       Right Left   Disc mild pallor, sharp rim Pink and Sharp   C/D Ratio 0.4 0.6   Macula Flat, Blunted foveal reflex, RPE mottling, No heme or edema Flat, Blunted foveal reflex, RPE mottling, no heme or edema   Vessels Vascular attenuation, mild tortuosity Attenuated, mild tortuosity   Periphery Hazy view improved, choroidal effusion improved, scattered MA/DBH Attached, no heme           Refraction     Wearing Rx       Sphere Cylinder Axis Add   Right +0.25 +1.25 167 +2.25   Left -0.50 +2.00 179 +2.25           IMAGING AND PROCEDURES  Imaging and Procedures for 08/16/2024  OCT, Retina - OU - Both Eyes       Right Eye Quality was poor. Central Foveal Thickness: 287. Progression has improved. Findings include normal foveal contour, no IRF, no SRF (NFP, no IRF no SRF. Scattered vitreous opacities- improved. Irregular RPE contour--improving).   Left Eye Quality was good. Central Foveal Thickness: 280. Progression has been stable. Findings include normal foveal contour, no IRF, no SRF, vitreomacular adhesion .   Notes *Images captured and stored on drive  Diagnosis / Impression:  OD: NFP; no IRF/SRF. Scattered vitreous opacities- improved. Irregular RPE contour temporally  OS: NFP; no IRF/SRF   Clinical management:  See below  Abbreviations: NFP - Normal foveal profile. CME - cystoid macular edema. PED - pigment epithelial detachment. IRF - intraretinal fluid. SRF - subretinal  fluid. EZ - ellipsoid zone. ERM - epiretinal membrane. ORA - outer retinal atrophy. ORT - outer retinal tubulation. SRHM - subretinal hyper-reflective material. IRHM - intraretinal hyper-reflective material           ASSESSMENT/PLAN:   ICD-10-CM   1. Vitreous hemorrhage of right eye (HCC)  H43.11 OCT, Retina - OU - Both Eyes    2. Choroidal effusion  H31.8     3. Diabetes mellitus type 2 without  retinopathy (HCC)  E11.9     4. Diabetes mellitus treated with injections of non-insulin medication (HCC)  E11.9    Z79.85     5. Essential hypertension  I10     6. Hypertensive retinopathy of both eyes  H35.033     7. Pseudophakia of right eye  Z96.1     8. Combined forms of age-related cataract of left eye  H25.812      **S/p CEIOL OD on 07.25.25 complicated by ant vitreous prolapse and underwent limited anterior vitrectomy. - developed choroidal effusion and vit hemorrhage after surgery  1. Vitreous hemorrhage OD  - diffuse VH following cataract surgery -- improving - b-scan 08.08.25 shows diffuse vit opacities consistent with VH, no obvious RT/RD; +choroidal effusion (see below)  - VH precautions reviewed -- minimize activities, keep head elevated, avoid ASA/NSAIDs/blood thinners as able   - monitor for now; PPV was discussed with patient as option for clearing out debris and VH OD in hopes of improving vision.  - f/u 4 wks, DFE, OCT  2. Choroidal Effusion OD - OCT OD shows NFP, no IRF no SRF. Scattered vitreous opacities- improved. Irregular RPE contour--improving - b-scan 08.08.25 also showed +choroidal effusion temporal periphery - Per Dr. Juli; Moxi OD QID, PredForte QID OD, Brim OS TID, Timolol OD BID, Rocklatan OU QHS, Dorz OU TID, and Atropine OD BID - stopped Diamox  PO 2 pills BID due to sickness - Recommend upright positioning as much as possible, continue gtt regimen per Dr. Juli - pt reports significant eye pain / discomfort that has improved with sleeping with head elevated - Dr. Juli to f/u with pt next week  - f/u here in 4 wks, DFE, OCT  3,4. Diabetes mellitus, type 2 without retinopathy - The incidence, risk factors for progression, natural history and treatment options for diabetic retinopathy were discussed with patient.   - The need for close monitoring of blood glucose, blood pressure, and serum lipids, avoiding cigarette or any type of tobacco, and  the need for long term follow up was also discussed with patient. - f/u in 1 year, sooner prn   5,6. Hypertensive retinopathy OU - discussed importance of tight BP control - monitor   7. Pseudophakia OD  - s/p CE/IOL OD  - IOL in good position -- VH and choroidal effusion (see above)  - monitor   8. Mixed Cataract OS - The symptoms of cataract, surgical options, and treatments and risks were discussed with patient. - discussed diagnosis and progression - under the expert management of Dr. Juli  Ophthalmic Meds Ordered this visit:  No orders of the defined types were placed in this encounter.    Return for 3-4wks VH/choroidal effusion OD, DFE, OCT.  There are no Patient Instructions on file for this visit.  Explained the diagnoses, plan, and follow up with the patient and they expressed understanding.  Patient expressed understanding of the importance of proper follow up care.   This document serves as a record of services personally performed by Redell  JUDITHANN Hans, MD, PhD. It was created on their behalf by Almetta Pesa, an ophthalmic technician. The creation of this record is the provider's dictation and/or activities during the visit.    Electronically signed by: Almetta Pesa, OA, 08/20/24  6:03 PM  Redell JUDITHANN Hans, M.D., Ph.D. Diseases & Surgery of the Retina and Vitreous [facility] 08/16/2024  I have reviewed the above documentation for accuracy and completeness, and I agree with the above. Redell JUDITHANN Hans, M.D., Ph.D. 08/20/24 6:05 PM   Abbreviations: M myopia (nearsighted); A astigmatism; H hyperopia (farsighted); P presbyopia; Mrx spectacle prescription;  CTL contact lenses; OD right eye; OS left eye; OU both eyes  XT exotropia; ET esotropia; PEK punctate epithelial keratitis; PEE punctate epithelial erosions; DES dry eye syndrome; MGD meibomian gland dysfunction; ATs artificial tears; PFAT's preservative free artificial tears; NSC nuclear  sclerotic cataract; PSC posterior subcapsular cataract; ERM epi-retinal membrane; PVD posterior vitreous detachment; RD retinal detachment; DM diabetes mellitus; DR diabetic retinopathy; NPDR non-proliferative diabetic retinopathy; PDR proliferative diabetic retinopathy; CSME clinically significant macular edema; DME diabetic macular edema; dbh dot blot hemorrhages; CWS cotton wool spot; POAG primary open angle glaucoma; C/D cup-to-disc ratio; HVF humphrey visual field; GVF goldmann visual field; OCT optical coherence tomography; IOP intraocular pressure; BRVO Branch retinal vein occlusion; CRVO central retinal vein occlusion; CRAO central retinal artery occlusion; BRAO branch retinal artery occlusion; RT retinal tear; SB scleral buckle; PPV pars plana vitrectomy; VH Vitreous hemorrhage; PRP panretinal laser photocoagulation; IVK intravitreal kenalog; VMT vitreomacular traction; MH Macular hole;  NVD neovascularization of the disc; NVE neovascularization elsewhere; AREDS age related eye disease study; ARMD age related macular degeneration; POAG primary open angle glaucoma; EBMD epithelial/anterior basement membrane dystrophy; ACIOL anterior chamber intraocular lens; IOL intraocular lens; PCIOL posterior chamber intraocular lens; Phaco/IOL phacoemulsification with intraocular lens placement; PRK photorefractive keratectomy; LASIK laser assisted in situ keratomileusis; HTN hypertension; DM diabetes mellitus; COPD chronic obstructive pulmonary disease

## 2024-08-16 ENCOUNTER — Ambulatory Visit (INDEPENDENT_AMBULATORY_CARE_PROVIDER_SITE_OTHER): Admitting: Ophthalmology

## 2024-08-16 ENCOUNTER — Encounter (INDEPENDENT_AMBULATORY_CARE_PROVIDER_SITE_OTHER): Payer: Self-pay | Admitting: Ophthalmology

## 2024-08-16 DIAGNOSIS — H35033 Hypertensive retinopathy, bilateral: Secondary | ICD-10-CM

## 2024-08-16 DIAGNOSIS — Z961 Presence of intraocular lens: Secondary | ICD-10-CM

## 2024-08-16 DIAGNOSIS — E119 Type 2 diabetes mellitus without complications: Secondary | ICD-10-CM

## 2024-08-16 DIAGNOSIS — H318 Other specified disorders of choroid: Secondary | ICD-10-CM

## 2024-08-16 DIAGNOSIS — I10 Essential (primary) hypertension: Secondary | ICD-10-CM

## 2024-08-16 DIAGNOSIS — H4311 Vitreous hemorrhage, right eye: Secondary | ICD-10-CM

## 2024-08-16 DIAGNOSIS — Z7985 Long-term (current) use of injectable non-insulin antidiabetic drugs: Secondary | ICD-10-CM

## 2024-08-16 DIAGNOSIS — H25812 Combined forms of age-related cataract, left eye: Secondary | ICD-10-CM

## 2024-08-20 ENCOUNTER — Encounter (INDEPENDENT_AMBULATORY_CARE_PROVIDER_SITE_OTHER): Payer: Self-pay | Admitting: Ophthalmology

## 2024-08-30 NOTE — Progress Notes (Signed)
 [facility] - Clinic Note  09/06/2024   CHIEF COMPLAINT Patient presents for Retina Follow Up  HISTORY OF PRESENT ILLNESS: Eric Dougherty is a 73 y.o. male who presents to the clinic today for:  HPI     Retina Follow Up   In left eye.  This started 3 weeks ago.  Duration of 3 weeks.  Since onset it is gradually worsening.  I, the attending physician,  performed the HPI with the patient and updated documentation appropriately.        Comments   3 week retina follow up VH OD pt is reporting vision is fluctuating he has had flashes and floaters pt is reporting last reading 180 range about a week ago pt is using PRED  BID OD BROM TID od TIMOLOL BID OU LATANOPROST OU at bedtime DORZ TID OU ATROPINE OD at bedtime       Last edited by [name], MD on 09/14/2024 12:53 PM.     Referring physician: [name], MD [address] [place],  [place] [address]  HISTORICAL INFORMATION:  Selected notes from the MEDICAL RECORD NUMBER Referred by Dr. Juli for [facility] and choroidal effusion OD following cataract surgery LEE:  Ocular Hx- PMH-   CURRENT MEDICATIONS: No current facility-administered medications for this visit. (Ophthalmic Drugs)   No current outpatient medications on file. (Ophthalmic Drugs)   No current facility-administered medications for this visit. (Other)   No current outpatient medications on file. (Other)   Facility-Administered Medications Ordered in Other Visits (Other)  Medication Route   0.9 %  sodium chloride  infusion Intravenous   insulin aspart (novoLOG) injection 0-7 Units Subcutaneous   REVIEW OF SYSTEMS: ROS   Positive for: Eyes Last edited by Resa Delon LELON, COT on 09/06/2024  8:51 AM.      ALLERGIES No Known Allergies  PAST MEDICAL HISTORY Past Medical History:  Diagnosis Date   Cirrhosis (HCC)    Diabetes mellitus    resolution of symptoms   History of kidney stones    Hypertension    no longer on  meds   PONV (postoperative nausea and vomiting)    Sciatica    Sleep apnea    Past Surgical History:  Procedure Laterality Date   ANTERIOR VITRECTOMY Right 05/26/2024   Procedure: VITRECTOMY, ANTERIOR;  Surgeon: [name], MD;  Location: [facility];  Service: Ophthalmology;  Laterality: Right;   BIOPSY  11/09/2018   Procedure: BIOPSY;  Surgeon: Shaaron Lamar HERO, MD;  Location: [facility];  Service: Endoscopy;;  gastric    CATARACT EXTRACTION W/PHACO Right 05/26/2024   Procedure: PHACOEMULSIFICATION, CATARACT, WITH IOL INSERTION;  Surgeon: [name], MD;  Location: [facility];  Service: Ophthalmology;  Laterality: Right;  CDE: 4.08   COLONOSCOPY  11/20/2005   RMR: Minimal hemorrhoids, otherwise normal   COLONOSCOPY N/A 03/25/2016   three semi-pedunculated polyps 4-6 mm in size, multiple medium-mouthed sigmoid diverticula (hyperplastic).    COLONOSCOPY WITH PROPOFOL  N/A 01/02/2021   normal   ESOPHAGOGASTRODUODENOSCOPY N/A 11/09/2018   11/09/2018 with erosive reflux esophagitis, erythematous mucosa in the stomach status post biopsy found to be mild chronic gastritis.   ESOPHAGOGASTRODUODENOSCOPY N/A 04/26/2024   Procedure: EGD (ESOPHAGOGASTRODUODENOSCOPY);  Surgeon: Shaaron Lamar HERO, MD;  Location: [facility];  Service: Endoscopy;  Laterality: N/A;  11:00 AM , ASA 3   ESOPHAGOGASTRODUODENOSCOPY (EGD) WITH PROPOFOL  N/A 01/02/2021   normal esophagus, portal gastropathy that was mild, normal duodenum.   HEMORRHOID SURGERY  HERNIA REPAIR     INSERTION, STENT, DRUG-ELUTING, LACRIMAL CANALICULUS Right 05/26/2024   Procedure: INSERTION, STENT, DRUG-ELUTING, LACRIMAL CANALICULUS;  Surgeon: [name], MD;  Location: [facility];  Service: Ophthalmology;  Laterality: Right;   LITHOTRIPSY     TONSILLECTOMY     TOOTH EXTRACTION     FAMILY HISTORY Family History  Problem Relation Age of Onset   Leukemia Father    Hypothyroidism Brother    Glaucoma Brother    Rectal cancer Brother  68       s/p surgical resection; dx 2 years ago   Osteoarthritis Other    Prostate cancer Paternal Grandfather    Cirrhosis Cousin    SOCIAL HISTORY Social History   Tobacco Use   Smoking status: Former   Smokeless tobacco: Former    Types: Chew    Quit date: 11/02/1978  Vaping Use   Vaping status: Never Used  Substance Use Topics   Alcohol use: Not Currently    Alcohol/week: 7.0 standard drinks of alcohol    Types: 7 Standard drinks or equivalent per week    Comment: 2-3 at a time; 2-3 times a week; previously: none in 2019 but did have 1 drink in June on vacation.   Drug use: No       OPHTHALMIC EXAM:  Base Eye Exam     Visual Acuity (Snellen - Linear)       Right Left   Dist cc 20/400 20/25 -1   Dist ph cc 20/80 -1 NI    Correction: Glasses         Tonometry (Tonopen, 9:00 AM)       Right Left   Pressure 28 26  1  gtts brom dorz ou @907         Pupils       Pupils Dark Light Shape React APD   Right PERRL 5 5 Round NR None   Left PERRL 4 3 Round Brisk None         Visual Fields       Left Right    Full    Restrictions  Total superior temporal, inferior temporal, superior nasal, inferior nasal deficiencies         Extraocular Movement       Right Left    Full, Ortho Full, Ortho         Neuro/Psych     Oriented x3: Yes   Mood/Affect: Normal         Dilation     Both eyes: 2.5% Phenylephrine  @ 9:01 AM           Slit Lamp and Fundus Exam     External Exam       Right Left   External Normal Normal         Slit Lamp Exam       Right Left   Lids/Lashes Dermatochalasis Dermatochalasis, mild Ptosis   Conjunctiva/Sclera White and quiet, temporal pinguecula White and quiet   Cornea 1+ fine PEE, tear film debris, Well healed temporal cataract wound, mild EBMD superiorly trace tear film debris   Anterior Chamber Deep, narrow angle, 1+ fine Cell/pigment Deep, narrow temporal angle   Iris Peaked pupil to 630, well dilated, No  NVI Round and dilated, no NVI   Lens PC IOL in good position, fine pigment on optic trace Posterior capsular opacification 2-3+ Cortical cataract, 2-3+ Nuclear sclerosis   Anterior Vitreous Vitreous syneresis, +RBC's -improving, vitreous to corneal wound at 630, diffuse VH clearing and  settling inferiorly Vitreous syneresis         Fundus Exam       Right Left   Disc mild pallor, sharp rim Pink and Sharp   C/D Ratio 0.4 0.6   Macula Flat, Blunted foveal reflex, RPE mottling, No heme or edema Flat, Blunted foveal reflex, RPE mottling, no heme or edema   Vessels Vascular attenuation, mild tortuosity Attenuated, mild tortuosity   Periphery Hazy view improved, choroidal effusion improved, scattered MA/DBH Attached, no heme           IMAGING AND PROCEDURES  Imaging and Procedures for 09/06/2024  OCT, Retina - OU - Both Eyes       Right Eye Quality was poor. Central Foveal Thickness: 290. Progression has improved. Findings include normal foveal contour, no IRF, no SRF (NFP, no IRF no SRF. Scattered vitreous opacities-slightly improved. Irregular RPE contour--improving).   Left Eye Quality was good. Central Foveal Thickness: 287. Progression has been stable. Findings include normal foveal contour, no IRF, no SRF, vitreomacular adhesion .   Notes *Images captured and stored on drive  Diagnosis / Impression:  OD: NFP; no IRF/SRF. Scattered vitreous opacities-slighlty improved. Irregular RPE contour temporally  OS: NFP; no IRF/SRF   Clinical management:  See below  Abbreviations: NFP - Normal foveal profile. CME - cystoid macular edema. PED - pigment epithelial detachment. IRF - intraretinal fluid. SRF - subretinal fluid. EZ - ellipsoid zone. ERM - epiretinal membrane. ORA - outer retinal atrophy. ORT - outer retinal tubulation. SRHM - subretinal hyper-reflective material. IRHM - intraretinal hyper-reflective material            ASSESSMENT/PLAN:   ICD-10-CM   1. Vitreous  hemorrhage of right eye (HCC)  H43.11 OCT, Retina - OU - Both Eyes    2. Choroidal effusion  H31.8     3. 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 Diabetes mellitus treated with injections of non-insulin medication (HCC)  E11.9    Z79.85     5. Essential hypertension  I10     6. Hypertensive retinopathy of both eyes  H35.033     7. Pseudophakia of right eye  Z96.1      **S/p CEIOL OD on 07.25.25 complicated by ant vitreous prolapse and underwent limited anterior vitrectomy. - developed choroidal effusion and vit hemorrhage after surgery  1. Vitreous hemorrhage OD  - diffuse VH following cataract surgery -- improving - b-scan 08.08.25 shows diffuse vit opacities consistent with VH, no obvious RT/RD; +choroidal effusion (see below)  - VH precautions reviewed -- minimize activities, keep head elevated, avoid ASA/NSAIDs/blood thinners as able  - recommend 25g PPV OD for clearing out debris and VH OD in hopes of improving vision.  - pt wishes to proceed with surgery OD - RBA of procedure discussed, questions answered - informed consent obtained and signed - Case scheduled for Thursday, 11.13.25 at [facility] OR - f/u POV1 on 11.14.25  2. Choroidal Effusion OD - OCT OD shows NFP, no IRF no SRF. Scattered vitreous opacities- slightly improved. Irregular RPE contour--improving - b-scan 08.08.25 also showed +choroidal effusion temporal periphery - Per Dr. Juli; Moxi OD QID, PredForte QID OD, Brim OS TID, Timolol OD BID, Rocklatan OU QHS, Dorz OU TID, and Atropine OD BID - stopped Diamox  PO 2 pills BID due to sickness - Recommend upright positioning as much as possible, continue gtt regimen per Dr. Juli - pt reports significant eye pain / discomfort that has improved  with sleeping with head elevated ` - monitor  3,4. Diabetes mellitus, type 2 without retinopathy - The incidence, risk factors for progression, natural history and treatment options for diabetic  retinopathy were discussed with patient.   - The need for close monitoring of blood glucose, blood pressure, and serum lipids, avoiding cigarette or any type of tobacco, and the need for long term follow up was also discussed with patient. - f/u in 1 year, sooner prn   5,6. Hypertensive retinopathy OU - discussed importance of tight BP control - monitor   7. Pseudophakia OD  - s/p CE/IOL OD  - IOL in good position -- VH and choroidal effusion (see above)  - monitor   8. Mixed Cataract OS - The symptoms of cataract, surgical options, and treatments and risks were discussed with patient. - discussed diagnosis and progression - under the expert management of Dr. Juli  Ophthalmic Meds Ordered this visit:  No orders of the defined types were placed in this encounter.    Return in about 9 days (around 09/15/2024) for 8 am POV 1 s/p PPV OD, DFE, OCT.  There are no Patient Instructions on file for this visit.  Explained the diagnoses, plan, and follow up with the patient and they expressed understanding.  Patient expressed understanding of the importance of proper follow up care.   This document serves as a record of services personally performed by Redell JUDITHANN Hans, MD, PhD. It was created on their behalf by Almetta Pesa, an ophthalmic technician. The creation of this record is the provider's dictation and/or activities during the visit.    Electronically signed by: Almetta Pesa, OA, 09/14/24  12:54 PM  This document serves as a record of services personally performed by Redell JUDITHANN Hans, MD, PhD. It was created on their behalf by Wanda GEANNIE Keens, COT an ophthalmic technician. The creation of this record is the provider's dictation and/or activities during the visit.    Electronically signed by:  Wanda GEANNIE Keens, COT  09/14/24 12:54 PM  Redell JUDITHANN Hans, M.D., Ph.D. Diseases & Surgery of the Retina and Vitreous [facility] 09/06/2024  I have  reviewed the above documentation for accuracy and completeness, and I agree with the above. Redell JUDITHANN Hans, M.D., Ph.D. 09/14/24 12:56 PM   Abbreviations: M myopia (nearsighted); A astigmatism; H hyperopia (farsighted); P presbyopia; Mrx spectacle prescription;  CTL contact lenses; OD right eye; OS left eye; OU both eyes  XT exotropia; ET esotropia; PEK punctate epithelial keratitis; PEE punctate epithelial erosions; DES dry eye syndrome; MGD meibomian gland dysfunction; ATs artificial tears; PFAT's preservative free artificial tears; NSC nuclear sclerotic cataract; PSC posterior subcapsular cataract; ERM epi-retinal membrane; PVD posterior vitreous detachment; RD retinal detachment; DM diabetes mellitus; DR diabetic retinopathy; NPDR non-proliferative diabetic retinopathy; PDR proliferative diabetic retinopathy; CSME clinically significant macular edema; DME diabetic macular edema; dbh dot blot hemorrhages; CWS cotton wool spot; POAG primary open angle glaucoma; C/D cup-to-disc ratio; HVF humphrey visual field; GVF goldmann visual field; OCT optical coherence tomography; IOP intraocular pressure; BRVO Branch retinal vein occlusion; CRVO central retinal vein occlusion; CRAO central retinal artery occlusion; BRAO branch retinal artery occlusion; RT retinal tear; SB scleral buckle; PPV pars plana vitrectomy; VH Vitreous hemorrhage; PRP panretinal laser photocoagulation; IVK intravitreal kenalog; VMT vitreomacular traction; MH Macular hole;  NVD neovascularization of the disc; NVE neovascularization elsewhere; AREDS age related eye disease study; ARMD age related macular degeneration; POAG primary open angle glaucoma; EBMD epithelial/anterior basement membrane dystrophy;  ACIOL anterior chamber intraocular lens; IOL intraocular lens; PCIOL posterior chamber intraocular lens; Phaco/IOL phacoemulsification with intraocular lens placement; PRK photorefractive keratectomy; LASIK laser assisted in situ keratomileusis;  HTN hypertension; DM diabetes mellitus; COPD chronic obstructive pulmonary disease

## 2024-09-06 ENCOUNTER — Ambulatory Visit (INDEPENDENT_AMBULATORY_CARE_PROVIDER_SITE_OTHER): Admitting: Ophthalmology

## 2024-09-06 ENCOUNTER — Encounter (INDEPENDENT_AMBULATORY_CARE_PROVIDER_SITE_OTHER): Payer: Self-pay | Admitting: Ophthalmology

## 2024-09-06 DIAGNOSIS — H35033 Hypertensive retinopathy, bilateral: Secondary | ICD-10-CM

## 2024-09-06 DIAGNOSIS — I10 Essential (primary) hypertension: Secondary | ICD-10-CM

## 2024-09-06 DIAGNOSIS — Z7985 Long-term (current) use of injectable non-insulin antidiabetic drugs: Secondary | ICD-10-CM | POA: Diagnosis not present

## 2024-09-06 DIAGNOSIS — H4311 Vitreous hemorrhage, right eye: Secondary | ICD-10-CM | POA: Diagnosis not present

## 2024-09-06 DIAGNOSIS — E119 Type 2 diabetes mellitus without complications: Secondary | ICD-10-CM | POA: Diagnosis not present

## 2024-09-06 DIAGNOSIS — H318 Other specified disorders of choroid: Secondary | ICD-10-CM

## 2024-09-06 DIAGNOSIS — Z961 Presence of intraocular lens: Secondary | ICD-10-CM

## 2024-09-12 ENCOUNTER — Other Ambulatory Visit: Payer: Self-pay

## 2024-09-12 ENCOUNTER — Encounter (HOSPITAL_COMMUNITY): Payer: Self-pay | Admitting: Ophthalmology

## 2024-09-12 NOTE — Progress Notes (Signed)
 SDW CALL  Patient was given pre-op instructions over the phone. The opportunity was given for the patient to ask questions. No further questions asked. Patient verbalized understanding of instructions given.   PCP - Leonce Lucie PARAS, PA-C Cardiologist - denies  PPM/ICD - denies Device Orders -  Rep Notified -   Chest x-ray - na EKG - 04/26/24 Stress Test - NM stress test 08/25/17 ECHO - 08/07/24 Cardiac Cath - denies  Sleep Study - +OSA CPAP - yes  Fasting Blood Sugar - gets his sugar checked twice a year at the doctors. Can't remember what it is. Checks Blood Sugar - doesn't check his sugar  Blood Thinner Instructions:na Aspirin Instructions:pt reports he last took aspirin 09/12/24  ERAS Protcol -clears until 0830. PRE-SURGERY Ensure or G2- no  COVID TEST- na   Anesthesia review: no  Patient denies shortness of breath, fever, cough and chest pain over the phone call   As of today, STOP taking any Aspirin (unless  Special instructions:    Oral Hygiene is also important to reduce your risk of infection.  Remember - BRUSH YOUR TEETH THE MORNING OF SURGERY WITH YOUR REGULAR TOOTHPASTE

## 2024-09-12 NOTE — H&P (Signed)
 Eric Dougherty is an 73 y.o. male.    Chief Complaint: vitreous hemorrhage, RIGHT EYE  HPI: Pt with history of decreased vision following cataract surgery OD on 07.25.25, complicated by posterior capsule violation, vitreous prolapse and vitreous hemorrhage. The vitreous hemorrhage has failed to clear and after a discussion of the risks, benefits and alternatives to surgery, the patient has elected to undergo 25g PPV OD under general anesthesia to clear the vitreous hemorrhage.  Past Medical History:  Diagnosis Date   Cirrhosis (HCC)    Diabetes mellitus    resolution of symptoms   History of kidney stones    Hypertension    no longer on meds   PONV (postoperative nausea and vomiting)    Sciatica    Sleep apnea     Past Surgical History:  Procedure Laterality Date   ANTERIOR VITRECTOMY Right 05/26/2024   Procedure: VITRECTOMY, ANTERIOR;  Surgeon: [name], MD;  Location: [facility];  Service: Ophthalmology;  Laterality: Right;   BIOPSY  11/09/2018   Procedure: BIOPSY;  Surgeon: Shaaron Lamar HERO, MD;  [facility];  Service: Endoscopy;;  gastric    CATARACT EXTRACTION W/PHACO Right 05/26/2024   Procedure: PHACOEMULSIFICATION, CATARACT, WITH IOL INSERTION;  Surgeon: [name], MD;  Location: [facility];  Service: Ophthalmology;  Laterality: Right;  CDE: 4.08   COLONOSCOPY  11/20/2005   RMR: Minimal hemorrhoids, otherwise normal   COLONOSCOPY N/A 03/25/2016   three semi-pedunculated polyps 4-6 mm in size, multiple medium-mouthed sigmoid diverticula (hyperplastic).    COLONOSCOPY WITH PROPOFOL  N/A 01/02/2021   normal   ESOPHAGOGASTRODUODENOSCOPY N/A 11/09/2018   11/09/2018 with erosive reflux esophagitis, erythematous mucosa in the stomach status post biopsy found to be mild chronic gastritis.   ESOPHAGOGASTRODUODENOSCOPY N/A 04/26/2024   Procedure: EGD (ESOPHAGOGASTRODUODENOSCOPY);  Surgeon: Shaaron Lamar HERO, MD;  [facility];  Service: Endoscopy;   Laterality: N/A;  11:00 AM , ASA 3   ESOPHAGOGASTRODUODENOSCOPY (EGD) WITH PROPOFOL  N/A 01/02/2021   normal esophagus, portal gastropathy that was mild, normal duodenum.   HEMORRHOID SURGERY     HERNIA REPAIR     INSERTION, STENT, DRUG-ELUTING, LACRIMAL CANALICULUS Right 05/26/2024   Procedure: INSERTION, STENT, DRUG-ELUTING, LACRIMAL CANALICULUS;  Surgeon: [name], MD;  Location: [facility];  Service: Ophthalmology;  Laterality: Right;   LITHOTRIPSY     TONSILLECTOMY     TOOTH EXTRACTION      Family History  Problem Relation Age of Onset   Leukemia Father    Hypothyroidism Brother    Glaucoma Brother    Rectal cancer Brother 79       s/p surgical resection; dx 2 years ago   Osteoarthritis Other    Prostate cancer Paternal Grandfather    Cirrhosis Cousin    Social History:  reports that he has quit smoking. He quit smokeless tobacco use about 45 years ago.  His smokeless tobacco use included chew. He reports that he does not currently use alcohol after a past usage of about 7.0 standard drinks of alcohol per week. He reports that he does not use drugs.  Allergies: No Known Allergies  No medications prior to admission.    Review of systems otherwise negative  There were no vitals taken for this visit.  Physical exam: Mental status: oriented x3. Eyes: See eye exam associated with this date of surgery Ears, Nose, Throat: within normal limits Neck: Within Normal limits General: within normal limits Chest: Within normal limits Breast: deferred Heart: Within normal limits Abdomen: Within normal  limits GU: deferred Extremities: within normal limits Skin: within normal limits  Assessment/Plan 1. Vitreous hemorrhage, RIGHT EYE  Plan: To [facility] for 25g PPV w/ endolaser OD under general anesthesia - case scheduled for Thursday, Nov. 13, 1130 am -- [facility] OR 08  Eric Dougherty, M.D., Ph.D. Vitreoretinal Surgeon [facility]

## 2024-09-14 ENCOUNTER — Encounter (HOSPITAL_COMMUNITY): Payer: Self-pay | Admitting: Ophthalmology

## 2024-09-14 ENCOUNTER — Other Ambulatory Visit: Payer: Self-pay

## 2024-09-14 ENCOUNTER — Ambulatory Visit (HOSPITAL_COMMUNITY)

## 2024-09-14 ENCOUNTER — Encounter (HOSPITAL_COMMUNITY)
Admission: RE | Disposition: A | Discharge status: Home / Self Care | Payer: Self-pay | Source: Home / Self Care | Attending: Ophthalmology

## 2024-09-14 ENCOUNTER — Ambulatory Visit (HOSPITAL_COMMUNITY)
Admission: RE | Admit: 2024-09-14 | Discharge: 2024-09-14 | Disposition: A | Discharge status: Home / Self Care | Attending: Ophthalmology | Admitting: Ophthalmology

## 2024-09-14 ENCOUNTER — Encounter (INDEPENDENT_AMBULATORY_CARE_PROVIDER_SITE_OTHER): Payer: Self-pay | Admitting: Ophthalmology

## 2024-09-14 DIAGNOSIS — H4311 Vitreous hemorrhage, right eye: Secondary | ICD-10-CM | POA: Diagnosis not present

## 2024-09-14 DIAGNOSIS — E119 Type 2 diabetes mellitus without complications: Secondary | ICD-10-CM | POA: Insufficient documentation

## 2024-09-14 DIAGNOSIS — K7460 Unspecified cirrhosis of liver: Secondary | ICD-10-CM | POA: Diagnosis not present

## 2024-09-14 DIAGNOSIS — I10 Essential (primary) hypertension: Secondary | ICD-10-CM | POA: Insufficient documentation

## 2024-09-14 DIAGNOSIS — G4730 Sleep apnea, unspecified: Secondary | ICD-10-CM | POA: Diagnosis not present

## 2024-09-14 DIAGNOSIS — Z87891 Personal history of nicotine dependence: Secondary | ICD-10-CM | POA: Diagnosis not present

## 2024-09-14 DIAGNOSIS — Z8711 Personal history of peptic ulcer disease: Secondary | ICD-10-CM | POA: Diagnosis not present

## 2024-09-14 HISTORY — DX: Personal history of urinary calculi: Z87.442

## 2024-09-14 HISTORY — PX: PARS PLANA VITRECTOMY: SHX2166

## 2024-09-14 LAB — GLUCOSE, CAPILLARY
Glucose-Capillary: 105 mg/dL — ABNORMAL HIGH (ref 70–99)
Glucose-Capillary: 97 mg/dL (ref 70–99)
Glucose-Capillary: 98 mg/dL (ref 70–99)
Glucose-Capillary: 124 mg/dL — ABNORMAL HIGH (ref 70–99)

## 2024-09-14 SURGERY — PARS PLANA VITRECTOMY 25 GAUGE FOR ENDOPHTHALMITIS
Anesthesia: General | Site: Eye | Laterality: Right

## 2024-09-14 MED ORDER — SODIUM CHLORIDE (PF) 0.9 % IJ SOLN
INTRAMUSCULAR | Status: AC
  Filled 2024-09-14: qty 10

## 2024-09-14 MED ORDER — ORAL CARE MOUTH RINSE: 15.0000 mL | Freq: Once | OROMUCOSAL | Status: AC

## 2024-09-14 MED ORDER — ONDANSETRON HCL 4 MG/2ML IJ SOLN
INTRAMUSCULAR | Status: DC | PRN
  Administered 2024-09-14: 4 mg via INTRAVENOUS

## 2024-09-14 MED ORDER — OXYCODONE HCL 5 MG PO TABS
5.0000 mg | ORAL_TABLET | Freq: Once | ORAL | Status: AC | PRN
  Administered 2024-09-14: 5 mg via ORAL

## 2024-09-14 MED ORDER — STERILE WATER FOR INJECTION IJ SOLN
INTRAMUSCULAR | Status: DC | PRN
  Administered 2024-09-14: 20 mL

## 2024-09-14 MED ORDER — SUGAMMADEX SODIUM 200 MG/2ML IV SOLN
INTRAVENOUS | Status: DC | PRN
  Administered 2024-09-14: 200 mg via INTRAVENOUS

## 2024-09-14 MED ORDER — PHENYLEPHRINE 80 MCG/ML (10ML) SYRINGE FOR IV PUSH (FOR BLOOD PRESSURE SUPPORT)
PREFILLED_SYRINGE | INTRAVENOUS | Status: AC
  Filled 2024-09-14: qty 10

## 2024-09-14 MED ORDER — BSS PLUS IO SOLN
INTRAOCULAR | Status: AC
  Filled 2024-09-14: qty 500

## 2024-09-14 MED ORDER — FENTANYL CITRATE (PF) 100 MCG/2ML IJ SOLN
INTRAMUSCULAR | Status: AC
  Filled 2024-09-14: qty 2

## 2024-09-14 MED ORDER — DROPERIDOL 2.5 MG/ML IJ SOLN: 0.6250 mg | Freq: Once | INTRAMUSCULAR | Status: DC | PRN

## 2024-09-14 MED ORDER — OXYCODONE HCL 5 MG/5ML PO SOLN: 5.0000 mg | Freq: Once | ORAL | Status: AC | PRN

## 2024-09-14 MED ORDER — TROPICAMIDE 1 % OP SOLN
1.0000 [drp] | OPHTHALMIC | Status: AC | PRN
  Administered 2024-09-14 (×3): 1 [drp] via OPHTHALMIC
  Filled 2024-09-14: qty 15

## 2024-09-14 MED ORDER — TRIAMCINOLONE ACETONIDE 40 MG/ML IJ SUSP
INTRAMUSCULAR | Status: AC
  Filled 2024-09-14: qty 5

## 2024-09-14 MED ORDER — EPINEPHRINE PF 1 MG/ML IJ SOLN
INTRAMUSCULAR | Status: AC
  Filled 2024-09-14: qty 1

## 2024-09-14 MED ORDER — ATROPINE SULFATE 1 % OP SOLN
OPHTHALMIC | Status: AC
  Filled 2024-09-14: qty 5

## 2024-09-14 MED ORDER — FENTANYL CITRATE (PF) 100 MCG/2ML IJ SOLN
25.0000 ug | INTRAMUSCULAR | Status: DC | PRN
  Administered 2024-09-14 (×2): 50 ug via INTRAVENOUS

## 2024-09-14 MED ORDER — SODIUM CHLORIDE (PF) 0.9 % IJ SOLN
INTRAMUSCULAR | Status: DC | PRN
  Administered 2024-09-14: 10 mL

## 2024-09-14 MED ORDER — ROCURONIUM BROMIDE 10 MG/ML (PF) SYRINGE
PREFILLED_SYRINGE | INTRAVENOUS | Status: DC | PRN
  Administered 2024-09-14 (×2): 10 mg via INTRAVENOUS
  Administered 2024-09-14: 60 mg via INTRAVENOUS

## 2024-09-14 MED ORDER — NA CHONDROIT SULF-NA HYALURON 40-30 MG/ML IO SOSY
INTRAOCULAR | Status: AC
  Filled 2024-09-14: qty 1

## 2024-09-14 MED ORDER — FENTANYL CITRATE (PF) 100 MCG/2ML IJ SOLN
INTRAMUSCULAR | Status: DC | PRN
  Administered 2024-09-14 (×2): 50 ug via INTRAVENOUS

## 2024-09-14 MED ORDER — GATIFLOXACIN 0.5 % OP SOLN
OPHTHALMIC | Status: AC
  Filled 2024-09-14: qty 2.5

## 2024-09-14 MED ORDER — LIDOCAINE 2% (20 MG/ML) 5 ML SYRINGE
INTRAMUSCULAR | Status: DC | PRN
  Administered 2024-09-14: 60 mg via INTRAVENOUS

## 2024-09-14 MED ORDER — STERILE WATER FOR INJECTION IJ SOLN
INTRAMUSCULAR | Status: DC | PRN
  Administered 2024-09-14: 10 mL

## 2024-09-14 MED ORDER — PROPOFOL 10 MG/ML IV BOLUS
INTRAVENOUS | Status: AC
  Filled 2024-09-14: qty 20

## 2024-09-14 MED ORDER — BSS IO SOLN
INTRAOCULAR | Status: AC
  Filled 2024-09-14: qty 15

## 2024-09-14 MED ORDER — PREDNISOLONE ACETATE 1 % OP SUSP
OPHTHALMIC | Status: AC
  Filled 2024-09-14: qty 5

## 2024-09-14 MED ORDER — ATROPINE SULFATE 1 % OP SOLN
1.0000 [drp] | OPHTHALMIC | Status: AC | PRN
  Administered 2024-09-14 (×3): 1 [drp] via OPHTHALMIC
  Filled 2024-09-14: qty 2

## 2024-09-14 MED ORDER — CEFTAZIDIME 1 G IJ SOLR
INTRAMUSCULAR | Status: AC
  Filled 2024-09-14: qty 1

## 2024-09-14 MED ORDER — BUPIVACAINE HCL (PF) 0.75 % IJ SOLN
INTRAMUSCULAR | Status: AC
  Filled 2024-09-14: qty 10

## 2024-09-14 MED ORDER — INSULIN ASPART 100 UNIT/ML IJ SOLN: 0.0000 [IU] | INTRAMUSCULAR | Status: DC | PRN

## 2024-09-14 MED ORDER — ONDANSETRON HCL 4 MG/2ML IJ SOLN
INTRAMUSCULAR | Status: AC
  Filled 2024-09-14: qty 2

## 2024-09-14 MED ORDER — SODIUM CHLORIDE 0.9 % IV SOLN: INTRAVENOUS | Status: DC

## 2024-09-14 MED ORDER — BRIMONIDINE TARTRATE 0.2 % OP SOLN
OPHTHALMIC | Status: AC
  Filled 2024-09-14: qty 5

## 2024-09-14 MED ORDER — CHLORHEXIDINE GLUCONATE 0.12 % MT SOLN
OROMUCOSAL | Status: AC
  Administered 2024-09-14: 15 mL via OROMUCOSAL
  Filled 2024-09-14: qty 15

## 2024-09-14 MED ORDER — BACITRACIN-POLYMYXIN B 500-10000 UNIT/GM OP OINT
TOPICAL_OINTMENT | OPHTHALMIC | Status: AC
  Filled 2024-09-14: qty 3.5

## 2024-09-14 MED ORDER — NA CHONDROIT SULF-NA HYALURON 40-30 MG/ML IO SOSY
INTRAOCULAR | Status: DC | PRN
  Administered 2024-09-14: .5 mL via INTRAOCULAR

## 2024-09-14 MED ORDER — STERILE WATER FOR INJECTION IJ SOLN
INTRAMUSCULAR | Status: AC
  Filled 2024-09-14: qty 10

## 2024-09-14 MED ORDER — BACITRACIN-POLYMYXIN B 500-10000 UNIT/GM OP OINT
TOPICAL_OINTMENT | OPHTHALMIC | Status: DC | PRN
  Administered 2024-09-14: 1 via OPHTHALMIC

## 2024-09-14 MED ORDER — PHENYLEPHRINE 80 MCG/ML (10ML) SYRINGE FOR IV PUSH (FOR BLOOD PRESSURE SUPPORT)
PREFILLED_SYRINGE | INTRAVENOUS | Status: DC | PRN
  Administered 2024-09-14: 80 ug via INTRAVENOUS

## 2024-09-14 MED ORDER — BRIMONIDINE TARTRATE 0.2 % OP SOLN
OPHTHALMIC | Status: DC | PRN
  Administered 2024-09-14: 1 [drp] via OPHTHALMIC

## 2024-09-14 MED ORDER — PROPOFOL 10 MG/ML IV BOLUS
INTRAVENOUS | Status: DC | PRN
  Administered 2024-09-14: 150 mg via INTRAVENOUS

## 2024-09-14 MED ORDER — ATROPINE SULFATE 1 % OP SOLN
OPHTHALMIC | Status: DC | PRN
  Administered 2024-09-14: 1 [drp] via OPHTHALMIC

## 2024-09-14 MED ORDER — DORZOLAMIDE HCL 2 % OP SOLN
OPHTHALMIC | Status: DC | PRN
  Administered 2024-09-14: 1 [drp] via OPHTHALMIC

## 2024-09-14 MED ORDER — TRIAMCINOLONE ACETONIDE 40 MG/ML IJ SUSP
INTRAMUSCULAR | Status: DC | PRN
  Administered 2024-09-14: 3 mL

## 2024-09-14 MED ORDER — POLYMYXIN B SULFATE 500000 UNITS IJ SOLR
INTRAMUSCULAR | Status: AC
  Filled 2024-09-14: qty 10

## 2024-09-14 MED ORDER — DORZOLAMIDE HCL-TIMOLOL MAL 2-0.5 % OP SOLN
OPHTHALMIC | Status: AC
  Filled 2024-09-14: qty 10

## 2024-09-14 MED ORDER — PREDNISOLONE ACETATE 1 % OP SUSP
OPHTHALMIC | Status: DC | PRN
  Administered 2024-09-14: 1 [drp] via OPHTHALMIC

## 2024-09-14 MED ORDER — OXYCODONE HCL 5 MG PO TABS
ORAL_TABLET | ORAL | Status: AC
  Filled 2024-09-14: qty 1

## 2024-09-14 MED ORDER — EPINEPHRINE PF 1 MG/ML IJ SOLN
INTRAMUSCULAR | Status: DC | PRN
  Administered 2024-09-14: 1 mg

## 2024-09-14 MED ORDER — GATIFLOXACIN 0.5 % OP SOLN
OPHTHALMIC | Status: DC | PRN
  Administered 2024-09-14: 1 [drp] via OPHTHALMIC

## 2024-09-14 MED ORDER — BSS PLUS IO SOLN
INTRAOCULAR | Status: DC | PRN
  Administered 2024-09-14: 1 via INTRAOCULAR

## 2024-09-14 MED ORDER — DEXAMETHASONE SOD PHOSPHATE PF 10 MG/ML IJ SOLN
INTRAMUSCULAR | Status: DC | PRN
  Administered 2024-09-14: 5 mg via INTRAVENOUS

## 2024-09-14 MED ORDER — PHENYLEPHRINE HCL 10 % OP SOLN
1.0000 [drp] | OPHTHALMIC | Status: AC | PRN
  Administered 2024-09-14 (×3): 1 [drp] via OPHTHALMIC
  Filled 2024-09-14: qty 5

## 2024-09-14 MED ORDER — LIDOCAINE 2% (20 MG/ML) 5 ML SYRINGE
INTRAMUSCULAR | Status: AC
  Filled 2024-09-14: qty 5

## 2024-09-14 MED ORDER — PROPARACAINE HCL 0.5 % OP SOLN
1.0000 [drp] | OPHTHALMIC | Status: AC | PRN
  Administered 2024-09-14 (×3): 1 [drp] via OPHTHALMIC
  Filled 2024-09-14: qty 15

## 2024-09-14 MED ORDER — CHLORHEXIDINE GLUCONATE 0.12 % MT SOLN
15.0000 mL | Freq: Once | OROMUCOSAL | Status: AC
  Filled 2024-09-14: qty 15

## 2024-09-14 SURGICAL SUPPLY — 55 items
APPLICATOR COTTON TIP 6 STRL (MISCELLANEOUS) ×4 IMPLANT
BAND WRIST GAS GREEN (MISCELLANEOUS) IMPLANT
BETADINE 5% OPHTHALMIC (OPHTHALMIC) IMPLANT
BLADE EYE CATARACT 19 1.4 BEAV (BLADE) IMPLANT
BNDG EYE OVAL 2 1/8 X 2 5/8 (GAUZE/BANDAGES/DRESSINGS) ×1 IMPLANT
CABLE BIPOLOR RESECTION CORD (MISCELLANEOUS) ×1 IMPLANT
CANNULA ANT CHAM MAIN (OPHTHALMIC RELATED) IMPLANT
CANNULA DUALBORE 25G (CANNULA) ×1 IMPLANT
CANNULA FLEX TIP 25G (CANNULA) ×1 IMPLANT
CLSR STERI-STRIP ANTIMIC 1/2X4 (GAUZE/BANDAGES/DRESSINGS) ×1 IMPLANT
DRAPE INCISE 51X51 W/FILM STRL (DRAPES) ×1 IMPLANT
DRAPE MICROSCOPE LEICA 46X105 (MISCELLANEOUS) ×1 IMPLANT
DRAPE OPHTHALMIC 77X100 STRL (CUSTOM PROCEDURE TRAY) ×1 IMPLANT
FILTER STRAW FLUID ASPIR (MISCELLANEOUS) IMPLANT
FORCEPS GRIESHABER ILM 25G A (INSTRUMENTS) IMPLANT
FORCEPS GRIESHABER MAX 25G (MISCELLANEOUS) IMPLANT
GAS AUTO FILL CONSTELLATION (OPHTHALMIC) IMPLANT
GLOVE BIO SURGEON STRL SZ7 (GLOVE) ×1 IMPLANT
GLOVE BIO SURGEON STRL SZ7.5 (GLOVE) ×1 IMPLANT
GLOVE BIOGEL M 7.0 STRL (GLOVE) ×1 IMPLANT
GOWN STRL REUS W/ TWL LRG LVL3 (GOWN DISPOSABLE) ×3 IMPLANT
KIT BASIN OR (CUSTOM PROCEDURE TRAY) ×1 IMPLANT
KIT PERFLUORON PROCEDURE 5ML (MISCELLANEOUS) IMPLANT
LENS VITRECTOMY FLAT OCLR DISP (MISCELLANEOUS) IMPLANT
LOOP FINESSE 25 GA (MISCELLANEOUS) IMPLANT
NDL 18GX1X1/2 (RX/OR ONLY) (NEEDLE) ×2 IMPLANT
NDL 25GX 5/8IN NON SAFETY (NEEDLE) ×1 IMPLANT
NDL FILTER BLUNT 18X1 1/2 (NEEDLE) ×1 IMPLANT
NDL HYPO 30X.5 LL (NEEDLE) ×2 IMPLANT
NEEDLE 18GX1X1/2 (RX/OR ONLY) (NEEDLE) ×2 IMPLANT
NEEDLE 25GX 5/8IN NON SAFETY (NEEDLE) ×2 IMPLANT
NEEDLE FILTER BLUNT 18X1 1/2 (NEEDLE) ×2 IMPLANT
NEEDLE HYPO 30X.5 LL (NEEDLE) ×2 IMPLANT
PACK PHACO CONSTELL (MISCELLANEOUS) IMPLANT
PACK VITRECTOMY CUSTOM (CUSTOM PROCEDURE TRAY) ×1 IMPLANT
PAD ARMBOARD POSITIONER FOAM (MISCELLANEOUS) ×2 IMPLANT
PAK PIK VITRECTOMY CVS 25GA (OPHTHALMIC) ×1 IMPLANT
PENCIL BIPOLAR 25GA STR DISP (OPHTHALMIC RELATED) IMPLANT
PROBE ENDO DIATHERMY 25G (MISCELLANEOUS) IMPLANT
PROBE LASER ILLUM FLEX CVD 25G (OPHTHALMIC) IMPLANT
REPL STRA BRUSH NDL (NEEDLE) IMPLANT
REPL STRA BRUSH NEEDLE (NEEDLE) IMPLANT
RESERVOIR BACK FLUSH (MISCELLANEOUS) IMPLANT
RETRACTOR IRIS FLEX 25G GRIESH (INSTRUMENTS) IMPLANT
SCISSORS TIP ADVANCED DSP 25GA (INSTRUMENTS) IMPLANT
SET INJECTOR OIL FLUID CONSTEL (OPHTHALMIC) IMPLANT
SHIELD EYE LENSE ONLY DISP (GAUZE/BANDAGES/DRESSINGS) IMPLANT
SOLN 0.9% NACL POUR BTL 1000ML (IV SOLUTION) ×1 IMPLANT
SOLN STERILE WATER BTL 1000 ML (IV SOLUTION) ×1 IMPLANT
SUT VICRYL 7 0 TG140 8 (SUTURE) ×1 IMPLANT
SYR 10ML LL (SYRINGE) ×2 IMPLANT
SYR TB 1ML LUER SLIP (SYRINGE) ×2 IMPLANT
TOWEL GREEN STERILE FF (TOWEL DISPOSABLE) ×1 IMPLANT
TRAY FOLEY W/BAG SLVR 14FR (SET/KITS/TRAYS/PACK) IMPLANT
TUBING HIGH PRESS EXTEN 6IN (TUBING) ×1 IMPLANT

## 2024-09-14 NOTE — Anesthesia Preprocedure Evaluation (Addendum)
 Anesthesia Evaluation  Patient identified by MRN, date of birth, ID band Patient awake    Reviewed: Allergy & Precautions, H&P , NPO status , Patient's Chart, lab work & pertinent test results  History of Anesthesia Complications (+) PONV and history of anesthetic complications  Airway Mallampati: III  TM Distance: >3 FB Neck ROM: Full    Dental no notable dental hx.    Pulmonary sleep apnea , former smoker   Pulmonary exam normal breath sounds clear to auscultation       Cardiovascular hypertension, Normal cardiovascular exam Rhythm:Regular Rate:Normal     Neuro/Psych neg Seizures negative neurological ROS  negative psych ROS   GI/Hepatic PUD,,,(+) Cirrhosis     substance abuse  alcohol use  Endo/Other  diabetes, Type 2    Renal/GU negative Renal ROS  negative genitourinary   Musculoskeletal negative musculoskeletal ROS (+)    Abdominal   Peds negative pediatric ROS (+)  Hematology negative hematology ROS (+)   Anesthesia Other Findings Vitreous hemorrhage of right ey  Reproductive/Obstetrics negative OB ROS                              Anesthesia Physical Anesthesia Plan  ASA: 3  Anesthesia Plan: General   Post-op Pain Management:    Induction: Intravenous  PONV Risk Score and Plan: 3 and Ondansetron , Dexamethasone  and Treatment may vary due to age or medical condition  Airway Management Planned: Oral ETT  Additional Equipment:   Intra-op Plan:   Post-operative Plan: Extubation in OR  Informed Consent: I have reviewed the patients History and Physical, chart, labs and discussed the procedure including the risks, benefits and alternatives for the proposed anesthesia with the patient or authorized representative who has indicated his/her understanding and acceptance.     Dental advisory given  Plan Discussed with: CRNA  Anesthesia Plan Comments:           Anesthesia Quick Evaluation

## 2024-09-14 NOTE — Transfer of Care (Signed)
 Immediate Anesthesia Transfer of Care Note  Patient: Eric Dougherty  Procedure(s) Performed: PARS PLANA VITRECTOMY 25 GAUGE WITH ENDOLASER (Right: Eye)  Patient Location: PACU  Anesthesia Type:General  Level of Consciousness: awake, alert , and oriented  Airway & Oxygen Therapy: Patient Spontanous Breathing and Patient connected to face mask oxygen  Post-op Assessment: Report given to RN and Post -op Vital signs reviewed and stable  Post vital signs: Reviewed and stable  Last Vitals:  Vitals Value Taken Time  BP 132/77 09/14/24 15:45  Temp    Pulse 68 09/14/24 15:48  Resp 12 09/14/24 15:48  SpO2 91 % 09/14/24 15:48  Vitals shown include unfiled device data.  Last Pain:  Vitals:   09/14/24 0954  TempSrc:   PainSc: 0-No pain         Complications: No notable events documented.

## 2024-09-14 NOTE — Anesthesia Procedure Notes (Signed)
 Procedure Name: Intubation Date/Time: 09/14/2024 1:53 PM  Performed by: Elby Raelene SAUNDERS, CRNAPre-anesthesia Checklist: Patient identified, Emergency Drugs available, Suction available and Patient being monitored Patient Re-evaluated:Patient Re-evaluated prior to induction Oxygen Delivery Method: Circle System Utilized Preoxygenation: Pre-oxygenation with 100% oxygen Induction Type: IV induction Ventilation: Mask ventilation without difficulty Laryngoscope Size: Mac and 4 Grade View: Grade III Tube type: Oral Tube size: 7.5 mm Number of attempts: 1 Airway Equipment and Method: Stylet, Oral airway and Bite block Placement Confirmation: ETT inserted through vocal cords under direct vision, positive ETCO2 and breath sounds checked- equal and bilateral Secured at: 24 cm Tube secured with: Tape Dental Injury: Teeth and Oropharynx as per pre-operative assessment

## 2024-09-14 NOTE — Discharge Instructions (Addendum)
POSTOPERATIVE INSTRUCTIONS  Your doctor has performed vitreoretinal surgery on you at [facility]. [place] eye patched and shielded until seen by Dr. Coralyn Pear 8 AM tomorrow in clinic - Do not use drops until return - Sleep with head elevated 30-45 degrees   - No strenuous bending, stooping or lifting.  - You may not drive until further notice.  - Tylenol or any other over-the-counter pain reliever can be used according to your doctor. If more pain medicine is required, your doctor will have a prescription for you.  - You may read, go up and down stairs, and watch television.     Bernarda Caffey, M.D., Ph.D.

## 2024-09-14 NOTE — Brief Op Note (Signed)
 09/14/2024  3:46 PM  PATIENT:  Eric Dougherty  73 y.o. male  PRE-OPERATIVE DIAGNOSIS:  Vitreous hemorrhage of OD  POST-OPERATIVE DIAGNOSIS:  Vitreous hemorrhage of OD  PROCEDURE:  Procedure(s): PARS PLANA VITRECTOMY 25 GAUGE WITH ENDOLASER (Right)  SURGEON:  Surgeons and Role:    DEWAINE [name], MD - Primary  ASSISTANTS: Almetta Pesa, Ophthalmic Assistant    ANESTHESIA:   general  EBL:  minimal   BLOOD ADMINISTERED:none  DRAINS: none   LOCAL MEDICATIONS USED:  NONE  SPECIMEN:  No Specimen  DISPOSITION OF SPECIMEN:  N/A  COUNTS:  YES  TOURNIQUET:  * No tourniquets in log *  DICTATION: .Note written in EPIC  PLAN OF CARE: Discharge to home after PACU  PATIENT DISPOSITION:  PACU - hemodynamically stable.   Delay start of Pharmacological VTE agent (>24hrs) due to surgical blood loss or risk of bleeding: not applicable

## 2024-09-14 NOTE — Op Note (Signed)
 Date of procedure: 11.13.25   Surgeon: [name], MD, PhD  Assistant: Almetta Pesa, Ophthalmic Assistant    Pre-operative Diagnoses:  1. Non-clearing vitreous hemorrhage, Right Eye   Post-operative diagnosis:  1. Non-clearing vitreous hemorrhage, Right Eye   Anesthesia: GETA   Procedure: 1.  25 gauge pars plana vitrectomy, Right Eye CPT 67140  2.  Endolaser, Right Eye  Complications: none Estimated blood loss: minimal Specimens: none   Brief history:  The patient underwent cataract surgery OD on 07.25.25, complicated by violation of the posterior capsule, vitreous prolapse, choroidal effusion and vitreous hemorrhage. Through the post operative period, the vitreous hemorrhage failed to clear completely and the patient maintained decreased vision OD. This is affecting his activities of daily living, thus after consideration of the risks, benefits, and alternatives to surgery, the patient elected to proceed with surgery to clear the vitreous hemorrhage of the right eye. Informed consent was obtained from the patient and placed in the chart.       Procedure: The patient was brought to the preoperative holding area where the correct eye was confirmed and marked.  The patient was then brought to the operating room where general endotracheal anesthesia was induced. A secondary time-out was performed to identify the correct patient, eye, procedure, and any allergies. The eye was prepped and draped in the usual sterile ophthalmic fashion followed by placement of a lid speculum.  A 25 gauge trocar was placed in the inferotemporal quadrant in a beveled fashion. A 4 mm infusion cannula was placed through this trocar. The infusion cannula was confirmed in the vitreous cavity with no incarceration of retina or choroid prior to turning it on. Two additional 25 gauge trocars were placed in the superonasal and superotemporal quadrants (2 and 10 oclock, respectively) in a similar beveled fashion. A  standard three-port pars plana vitrectomy was performed using the light pipe, the cutter, and the BIOM viewing system. A thorough core and peripheral vitreous dissection was performed. Of note, there were significant vitreous opacities / old white vitreous hemorrhage obscuring the posterior pole. A posterior vitreous detachment was induced over the optic nerve with the assistance of kenalog. Using scleral depression, the vitreous base was carefully removed. There were no retinal breaks or tears upon inpection of the peripheral retina with 360 scleral depression.     Prophylactic peripheral laser retinopexy was administered 360 under scleral depression. After completion of these maneuvers, the posterior pole and peripheral retina were noted to be flat and the vitreous hemorrhage was cleared.  At this time, the trocars were removed and sutured with 7-0 vicryl in an interrupted fashion. Following closure, the globe was confirmed to be at physiologic pressure. Subconjunctival injections of Antibiotic and kenalog were administered. The lid speculum and drapes were removed. Drops of an antibiotic, antihypertensives, and steroid were given. The eye was patched and shielded. The patient tolerated the procedure well without any intraoperative or immediate postoperative complications. The patient was taken to the recovery room in good condition. The patient was instructed to maintain a head-upright position and will be seen by Dr. Hans tomorrow morning in clinic.

## 2024-09-14 NOTE — Anesthesia Postprocedure Evaluation (Signed)
 Anesthesia Post Note  Patient: Eric Dougherty  Procedure(s) Performed: PARS PLANA VITRECTOMY 25 GAUGE WITH ENDOLASER (Right: Eye)     Patient location during evaluation: PACU Anesthesia Type: General Level of consciousness: awake and alert, oriented and patient cooperative Pain management: pain level controlled Vital Signs Assessment: post-procedure vital signs reviewed and stable Respiratory status: spontaneous breathing, nonlabored ventilation and respiratory function stable Cardiovascular status: blood pressure returned to baseline and stable Postop Assessment: no apparent nausea or vomiting Anesthetic complications: no   No notable events documented.  Last Vitals:  Vitals:   09/14/24 1600 09/14/24 1615  BP: 128/64 122/71  Pulse: 68 70  Resp: 12 19  Temp:    SpO2: 92% 93%    Last Pain:  Vitals:   09/14/24 1607  TempSrc:   PainSc: 7                  Eric Dougherty

## 2024-09-14 NOTE — Interval H&P Note (Signed)
 History and Physical Interval Note:  09/14/2024 11:18 AM  Eric Dougherty  has presented today for surgery, with the diagnosis of Vitreous hemorrhage of OD, pt does not have endophthalmitis.  The various methods of treatment have been discussed with the patient and family. After consideration of risks, benefits and other options for treatment, the patient has consented to  Procedure(s): PARS PLANA VITRECTOMY 25 GAUGE FOR ENDOPHTHALMITIS (Right) as a surgical intervention.  The patient's history has been reviewed, patient examined, no change in status, stable for surgery.  I have reviewed the patient's chart and labs.  Questions were answered to the patient's satisfaction.     Eric Dougherty

## 2024-09-15 ENCOUNTER — Encounter (INDEPENDENT_AMBULATORY_CARE_PROVIDER_SITE_OTHER): Payer: Self-pay | Admitting: Ophthalmology

## 2024-09-15 ENCOUNTER — Ambulatory Visit (INDEPENDENT_AMBULATORY_CARE_PROVIDER_SITE_OTHER): Admitting: Ophthalmology

## 2024-09-15 DIAGNOSIS — H318 Other specified disorders of choroid: Secondary | ICD-10-CM

## 2024-09-15 DIAGNOSIS — I10 Essential (primary) hypertension: Secondary | ICD-10-CM

## 2024-09-15 DIAGNOSIS — E119 Type 2 diabetes mellitus without complications: Secondary | ICD-10-CM

## 2024-09-15 DIAGNOSIS — Z7985 Long-term (current) use of injectable non-insulin antidiabetic drugs: Secondary | ICD-10-CM

## 2024-09-15 DIAGNOSIS — Z961 Presence of intraocular lens: Secondary | ICD-10-CM

## 2024-09-15 DIAGNOSIS — H35033 Hypertensive retinopathy, bilateral: Secondary | ICD-10-CM

## 2024-09-15 DIAGNOSIS — H4311 Vitreous hemorrhage, right eye: Secondary | ICD-10-CM | POA: Diagnosis not present

## 2024-09-15 NOTE — Progress Notes (Signed)
 [facility] - Clinic Note  09/15/2024   CHIEF COMPLAINT Patient presents for Retina Follow Up  HISTORY OF PRESENT ILLNESS: Eric Dougherty is a 73 y.o. male who presents to the clinic today for:  HPI     Retina Follow Up   Patient presents with  Other.  In right eye.  This started 1 day ago.  I, the attending physician,  performed the HPI with the patient and updated documentation appropriately.        Comments   Patient here for 1 day retina follow up for POV OD. Patient states last night had a little pain after surgery. Took tylenol . All ok.       Last edited by [name], MD on 09/19/2024 10:44 PM.    Patient states the eye feels gritty. He feels that the vision has improved some.   Referring physician: [name], MD [address] [place],  [place] [address]  HISTORICAL INFORMATION:  Selected notes from the MEDICAL RECORD NUMBER Referred by Dr. Juli for [facility] and choroidal effusion OD following cataract surgery LEE:  Ocular Hx- PMH-   CURRENT MEDICATIONS: Current Outpatient Medications (Ophthalmic Drugs)  Medication Sig   atropine 1 % ophthalmic solution Place 1 drop into the right eye daily.   brimonidine (ALPHAGAN) 0.2 % ophthalmic solution Place 1 drop into the right eye 3 (three) times daily.   DORZOLAMIDE HCL OP Place 1 drop into both eyes 3 (three) times daily.   latanoprost (XALATAN) 0.005 % ophthalmic solution Place 1 drop into both eyes at bedtime.   Polyethyl Glycol-Propyl Glycol (SYSTANE OP) Place 1 drop into both eyes See admin instructions. 2-4 times daily   prednisoLONE acetate (PRED FORTE) 1 % ophthalmic suspension Place 1 drop into the right eye 4 (four) times daily.   timolol (TIMOPTIC) 0.5 % ophthalmic solution Place into both eyes 2 (two) times daily.   bacitracin-polymyxin b (POLYSPORIN) ophthalmic ointment Place into both eyes 4 (four) times daily for 10 days. Place a 1/4 inch ribbon of ointment into the lower  eyelid.   dorzolamide-timolol (COSOPT) 2-0.5 % ophthalmic solution Place 1 drop into both eyes 2 (two) times daily. (Patient not taking: Reported on 09/15/2024)   moxifloxacin  (VIGAMOX ) 0.5 % ophthalmic solution Place 1 drop into the right eye 4 (four) times daily. (Patient not taking: Reported on 09/15/2024)   prednisoLONE acetate (PRED FORTE) 1 % ophthalmic suspension Place 1 drop into the right eye 4 (four) times daily.   ROCKLATAN 0.02-0.005 % SOLN Apply 1 drop to eye at bedtime. (Patient not taking: Reported on 09/15/2024)   No current facility-administered medications for this visit. (Ophthalmic Drugs)   Current Outpatient Medications (Other)  Medication Sig   aspirin EC 81 MG tablet Take 81 mg by mouth daily.   atorvastatin  (LIPITOR) 40 MG tablet TAKE 1 TABLET ONCE DAILY. (Patient taking differently: Take 40 mg by mouth daily.)   Cholecalciferol (VITAMIN D3) 50 MCG (2000 UT) TABS Take 2,000 Units by mouth daily.   empagliflozin (JARDIANCE) 25 MG TABS tablet Take 25 mg by mouth daily.   folic acid (FOLVITE) 1 MG tablet Take 1,000 mcg by mouth daily.   Ibuprofen -Acetaminophen  (ADVIL  DUAL ACTION) 125-250 MG TABS Take 1 tablet by mouth daily as needed (Pain).   lisinopril (ZESTRIL) 2.5 MG tablet Take 2.5 mg by mouth daily.   naproxen sodium (ALEVE) 220 MG tablet Take 440 mg by mouth daily as needed (pain).   Phenylephrine  HCl (NEO-SYNEPHRINE NA) Place 1  spray into both nostrils daily as needed.   tadalafil (CIALIS) 5 MG tablet Take 5 mg by mouth daily.   tamsulosin (FLOMAX) 0.4 MG CAPS capsule Take 0.4 mg by mouth daily.    thiamine (VITAMIN B-1) 100 MG tablet Take 100 mg by mouth daily.   vitamin C (ASCORBIC ACID) 500 MG tablet Take 500 mg by mouth daily.   zinc gluconate 50 MG tablet Take 50 mg by mouth daily.   acetaZOLAMIDE  (DIAMOX ) 250 MG tablet Take 500 mg by mouth 2 (two) times daily. (Patient not taking: Reported on 09/15/2024)   OZEMPIC, 0.25 OR 0.5 MG/DOSE, 2 MG/3ML SOPN Inject  2 mg into the skin once a week. (Patient not taking: Reported on 09/15/2024)   No current facility-administered medications for this visit. (Other)   REVIEW OF SYSTEMS: ROS   Positive for: Eyes Last edited by Orval Asberry RAMAN, COA on 09/15/2024  7:56 AM.       ALLERGIES No Known Allergies  PAST MEDICAL HISTORY Past Medical History:  Diagnosis Date   Cirrhosis (HCC)    Diabetes mellitus    resolution of symptoms   History of kidney stones    Hypertension    no longer on meds   PONV (postoperative nausea and vomiting)    Sciatica    Sleep apnea    Past Surgical History:  Procedure Laterality Date   ANTERIOR VITRECTOMY Right 05/26/2024   Procedure: VITRECTOMY, ANTERIOR;  Surgeon: [name], MD;  Location: [facility];  Service: Ophthalmology;  Laterality: Right;   BIOPSY  11/09/2018   Procedure: BIOPSY;  Surgeon: Shaaron Lamar HERO, MD;  Location: [facility];  Service: Endoscopy;;  gastric    CATARACT EXTRACTION W/PHACO Right 05/26/2024   Procedure: PHACOEMULSIFICATION, CATARACT, WITH IOL INSERTION;  Surgeon: [name], MD;  Location: [facility];  Service: Ophthalmology;  Laterality: Right;  CDE: 4.08   COLONOSCOPY  11/20/2005   RMR: Minimal hemorrhoids, otherwise normal   COLONOSCOPY N/A 03/25/2016   three semi-pedunculated polyps 4-6 mm in size, multiple medium-mouthed sigmoid diverticula (hyperplastic).    COLONOSCOPY WITH PROPOFOL  N/A 01/02/2021   normal   ESOPHAGOGASTRODUODENOSCOPY N/A 11/09/2018   11/09/2018 with erosive reflux esophagitis, erythematous mucosa in the stomach status post biopsy found to be mild chronic gastritis.   ESOPHAGOGASTRODUODENOSCOPY N/A 04/26/2024   Procedure: EGD (ESOPHAGOGASTRODUODENOSCOPY);  Surgeon: Shaaron Lamar HERO, MD;  Location: [facility];  Service: Endoscopy;  Laterality: N/A;  11:00 AM , ASA 3   ESOPHAGOGASTRODUODENOSCOPY (EGD) WITH PROPOFOL  N/A 01/02/2021   normal esophagus, portal gastropathy that was mild, normal duodenum.    HEMORRHOID SURGERY     HERNIA REPAIR     INSERTION, STENT, DRUG-ELUTING, LACRIMAL CANALICULUS Right 05/26/2024   Procedure: INSERTION, STENT, DRUG-ELUTING, LACRIMAL CANALICULUS;  Surgeon: [name], MD;  Location: [facility];  Service: Ophthalmology;  Laterality: Right;   LITHOTRIPSY     PARS PLANA VITRECTOMY Right 09/14/2024   Procedure: PARS PLANA VITRECTOMY 25 GAUGE WITH ENDOLASER;  Surgeon: [name], MD;  Location: [facility] OR;  Service: Ophthalmology;  Laterality: Right;   TONSILLECTOMY     TOOTH EXTRACTION     FAMILY HISTORY Family History  Problem Relation Age of Onset   Leukemia Father    Hypothyroidism Brother    Glaucoma Brother    Rectal cancer Brother 65       s/p surgical resection; dx 2 years ago   Osteoarthritis Other    Prostate cancer Paternal Grandfather    Cirrhosis Cousin    SOCIAL  HISTORY Social History   Tobacco Use   Smoking status: Former   Smokeless tobacco: Former    Types: Chew    Quit date: 11/02/1978  Vaping Use   Vaping status: Never Used  Substance Use Topics   Alcohol use: Not Currently    Alcohol/week: 7.0 standard drinks of alcohol    Types: 7 Standard drinks or equivalent per week    Comment: 2-3 at a time; 2-3 times a week; previously: none in 2019 but did have 1 drink in June on vacation.   Drug use: No       OPHTHALMIC EXAM:  Base Eye Exam     Visual Acuity (Snellen - Linear)       Right Left   Dist cc 20/800 20/25   Dist ph cc 20/250 +1     Correction: Glasses         Tonometry (Tonopen, 7:52 AM)       Right Left   Pressure 28,27,24   Brimonidine and Cosopt given @ 7:50am        Pupils       Dark Light Shape React APD   Right Dilated       Left 4 3 Round Brisk None         Visual Fields (Counting fingers)       Left Right    Full Full         Extraocular Movement       Right Left    Full, Ortho Full, Ortho         Neuro/Psych     Oriented x3: Yes   Mood/Affect: Normal          Dilation     Both eyes: 1.0% Mydriacyl , 2.5% Phenylephrine  @ 7:52 AM           Slit Lamp and Fundus Exam     External Exam       Right Left   External Normal Normal         Slit Lamp Exam       Right Left   Lids/Lashes Dermatochalasis Dermatochalasis, mild Ptosis   Conjunctiva/Sclera White and quiet, temporal pinguecula, Subconjunctival hemorrhage, Suture intact White and quiet   Cornea 2+ fine PEE, tear film debris, Well healed temporal cataract wound, Arcus trace tear film debris   Anterior Chamber Deep, narrow angle, 1+ fine Cell/pigment Deep, narrow temporal angle   Iris Peaked pupil to 630, well dilated, No NVI, IK touch 0630 Round and dilated, no NVI   Lens PC IOL in good position, fine pigment on optic trace, Posterior capsular opacification 2-3+ Cortical cataract, 2-3+ Nuclear sclerosis   Anterior Vitreous Post vitrectomy, clear Vitreous syneresis         Fundus Exam       Right Left   Disc mild pallor, sharp rim Pink and Sharp   C/D Ratio 0.4 0.6   Macula Flat, Blunted foveal reflex, RPE mottling, No heme or edema Flat, Blunted foveal reflex, RPE mottling, no heme or edema   Vessels Vascular attenuation, mild tortuosity Attenuated, mild tortuosity   Periphery Attached, good peripheral laser changes 360, Hazy view improved, choroidal effusion improved, scattered MA/DBH Attached, no heme           IMAGING AND PROCEDURES  Imaging and Procedures for 09/15/2024  OCT, Retina - OU - Both Eyes       Right Eye Quality was borderline. Central Foveal Thickness: 280. Progression has improved. Findings include normal foveal contour,  no IRF, no SRF ( Scattered vitreous opacities-slightly improved. Irregular RPE contour--improved).   Left Eye Quality was good. Central Foveal Thickness: 281. Progression has been stable. Findings include normal foveal contour, no IRF, no SRF, vitreomacular adhesion .   Notes *Images captured and stored on drive  Diagnosis /  Impression:  OD:  Scattered vitreous opacities-slightly improved. Irregular RPE contour--improved OS: NFP; no IRF/SRF   Clinical management:  See below  Abbreviations: NFP - Normal foveal profile. CME - cystoid macular edema. PED - pigment epithelial detachment. IRF - intraretinal fluid. SRF - subretinal fluid. EZ - ellipsoid zone. ERM - epiretinal membrane. ORA - outer retinal atrophy. ORT - outer retinal tubulation. SRHM - subretinal hyper-reflective material. IRHM - intraretinal hyper-reflective material           ASSESSMENT/PLAN:   ICD-10-CM   1. Vitreous hemorrhage of right eye (HCC)  H43.11 OCT, Retina - OU - Both Eyes    2. Choroidal effusion  H31.8     3. 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 Diabetes mellitus treated with injections of non-insulin medication (HCC)  E11.9    Z79.85     5. Essential hypertension  I10     6. Hypertensive retinopathy of both eyes  H35.033     7. Pseudophakia of right eye  Z96.1      **S/p CEIOL OD on 07.25.25 complicated by ant vitreous prolapse and underwent limited anterior vitrectomy. - developed choroidal effusion and vit hemorrhage after surgery  1. Vitreous hemorrhage OD  - diffuse VH following cataract surgery - b-scan 08.08.25 shows diffuse vit opacities consistent with VH, no obvious RT/RD; +choroidal effusion (see below)  - POD1 s/p PPV/EL OD,11.13.25             - doing well this morning             - IOP 28, elevated              - start   PF 4x/day OD                          zymaxid QID OD                          Atropine BID OD                          Brimonidine BID OU                          Cosopt BID OU                         PSO ung QID OD   - continue to sleep with head elevated             - eye shield when sleeping for 2 weeks             - post op drop and positioning instructions reviewed              - tylenol /ibuprofen  for pain   - f/u Thu 11.20.25 - POW1 - DFE, OCT  2.  Choroidal Effusion OD - OCT OD shows NFP, no IRF no SRF. Scattered vitreous opacities- slightly improved. Irregular RPE contour--improved - b-scan 08.08.25 also showed +choroidal effusion temporal periphery -  d/c all drops by Dr. Juli and begin drop schedule as per Dr. Anthonette Lesage - stopped Diamox  PO 2 pills BID due to sickness - pt reports significant eye pain / discomfort stably improved ` - monitor  3,4. Diabetes mellitus, type 2 without retinopathy - The incidence, risk factors for progression, natural history and treatment options for diabetic retinopathy were discussed with patient.   - The need for close monitoring of blood glucose, blood pressure, and serum lipids, avoiding cigarette or any type of tobacco, and the need for long term follow up was also discussed with patient. - f/u in 1 year, sooner prn   5,6. Hypertensive retinopathy OU - discussed importance of tight BP control - monitor   7. Pseudophakia OD  - s/p CE/IOL OD  - IOL in good position -- VH and choroidal effusion (see above)  - monitor   8. Mixed Cataract OS - The symptoms of cataract, surgical options, and treatments and risks were discussed with patient. - discussed diagnosis and progression - under the expert management of Dr. Juli  Ophthalmic Meds Ordered this visit:  No orders of the defined types were placed in this encounter.    Return in about 6 days (around 09/21/2024) for f/u POV , DFE, OCT.  There are no Patient Instructions on file for this visit.  Explained the diagnoses, plan, and follow up with the patient and they expressed understanding.  Patient expressed understanding of the importance of proper follow up care.    This document serves as a record of services personally performed by Redell JUDITHANN Hans, MD, PhD. It was created on their behalf by Wanda GEANNIE Keens, COT an ophthalmic technician. The creation of this record is the provider's dictation and/or activities during the visit.     Electronically signed by:  Wanda GEANNIE Keens, COT  09/21/24 11:33 AM  Redell JUDITHANN Hans, M.D., Ph.D. Diseases & Surgery of the Retina and Vitreous [facility] 09/15/2024  I have reviewed the above documentation for accuracy and completeness, and I agree with the above. Redell JUDITHANN Hans, M.D., Ph.D. 09/21/24 11:36 AM   Abbreviations: M myopia (nearsighted); A astigmatism; H hyperopia (farsighted); P presbyopia; Mrx spectacle prescription;  CTL contact lenses; OD right eye; OS left eye; OU both eyes  XT exotropia; ET esotropia; PEK punctate epithelial keratitis; PEE punctate epithelial erosions; DES dry eye syndrome; MGD meibomian gland dysfunction; ATs artificial tears; PFAT's preservative free artificial tears; NSC nuclear sclerotic cataract; PSC posterior subcapsular cataract; ERM epi-retinal membrane; PVD posterior vitreous detachment; RD retinal detachment; DM diabetes mellitus; DR diabetic retinopathy; NPDR non-proliferative diabetic retinopathy; PDR proliferative diabetic retinopathy; CSME clinically significant macular edema; DME diabetic macular edema; dbh dot blot hemorrhages; CWS cotton wool spot; POAG primary open angle glaucoma; C/D cup-to-disc ratio; HVF humphrey visual field; GVF goldmann visual field; OCT optical coherence tomography; IOP intraocular pressure; BRVO Branch retinal vein occlusion; CRVO central retinal vein occlusion; CRAO central retinal artery occlusion; BRAO branch retinal artery occlusion; RT retinal tear; SB scleral buckle; PPV pars plana vitrectomy; VH Vitreous hemorrhage; PRP panretinal laser photocoagulation; IVK intravitreal kenalog; VMT vitreomacular traction; MH Macular hole;  NVD neovascularization of the disc; NVE neovascularization elsewhere; AREDS age related eye disease study; ARMD age related macular degeneration; POAG primary open angle glaucoma; EBMD epithelial/anterior basement membrane dystrophy; ACIOL anterior chamber intraocular  lens; IOL intraocular lens; PCIOL posterior chamber intraocular lens; Phaco/IOL phacoemulsification with intraocular lens placement; PRK photorefractive keratectomy; LASIK laser assisted in situ keratomileusis; HTN hypertension;  DM diabetes mellitus; COPD chronic obstructive pulmonary disease

## 2024-09-19 ENCOUNTER — Encounter (INDEPENDENT_AMBULATORY_CARE_PROVIDER_SITE_OTHER): Payer: Self-pay | Admitting: Ophthalmology

## 2024-09-20 NOTE — Progress Notes (Signed)
 [facility] - Clinic Note  09/21/2024   CHIEF COMPLAINT Patient presents for Retina Follow Up  HISTORY OF PRESENT ILLNESS: Eric Dougherty is a 73 y.o. male who presents to the clinic today for:  HPI     Retina Follow Up   In right eye.  This started 6 days ago.  Duration of 6 days.  Since onset it is stable.  I, the attending physician,  performed the HPI with the patient and updated documentation appropriately.        Comments   6 day f/u POV heme OD pt is reporting vision maybe little improved he has some floaters denies flashes pt is using PF 4x/day OD  zymaxid  QID OD Atropine  BID OD  Brimonidine  BID OU Cosopt  BID OU  PSO ung QID OD        Last edited by [name], MD on 09/24/2024  8:16 PM.      Referring physician: [name], MD [address] [place],  [place] [address]  HISTORICAL INFORMATION:  Selected notes from the MEDICAL RECORD NUMBER Referred by Dr. Juli for [facility] and choroidal effusion OD following cataract surgery LEE:  Ocular Hx- PMH-   CURRENT MEDICATIONS: Current Outpatient Medications (Ophthalmic Drugs)  Medication Sig   bacitracin -polymyxin b  (POLYSPORIN ) ophthalmic ointment Place into both eyes 4 (four) times daily for 10 days. Place a 1/4 inch ribbon of ointment into the lower eyelid.   prednisoLONE  acetate (PRED FORTE ) 1 % ophthalmic suspension Place 1 drop into the right eye 4 (four) times daily.   atropine  1 % ophthalmic solution Place 1 drop into the right eye daily.   brimonidine  (ALPHAGAN ) 0.2 % ophthalmic solution Place 1 drop into the right eye 3 (three) times daily.   DORZOLAMIDE  HCL OP Place 1 drop into both eyes 3 (three) times daily.   dorzolamide -timolol  (COSOPT ) 2-0.5 % ophthalmic solution Place 1 drop into both eyes 2 (two) times daily. (Patient not taking: Reported on 09/15/2024)   latanoprost (XALATAN) 0.005 % ophthalmic solution Place 1 drop into both eyes at bedtime.   moxifloxacin  (VIGAMOX ) 0.5 %  ophthalmic solution Place 1 drop into the right eye 4 (four) times daily. (Patient not taking: Reported on 09/15/2024)   Polyethyl Glycol-Propyl Glycol (SYSTANE OP) Place 1 drop into both eyes See admin instructions. 2-4 times daily   prednisoLONE  acetate (PRED FORTE ) 1 % ophthalmic suspension Place 1 drop into the right eye 4 (four) times daily.   ROCKLATAN 0.02-0.005 % SOLN Apply 1 drop to eye at bedtime. (Patient not taking: Reported on 09/15/2024)   timolol  (TIMOPTIC ) 0.5 % ophthalmic solution Place into both eyes 2 (two) times daily.   No current facility-administered medications for this visit. (Ophthalmic Drugs)   Current Outpatient Medications (Other)  Medication Sig   acetaZOLAMIDE  (DIAMOX ) 250 MG tablet Take 500 mg by mouth 2 (two) times daily. (Patient not taking: Reported on 09/15/2024)   aspirin EC 81 MG tablet Take 81 mg by mouth daily.   atorvastatin  (LIPITOR) 40 MG tablet TAKE 1 TABLET ONCE DAILY. (Patient taking differently: Take 40 mg by mouth daily.)   Cholecalciferol (VITAMIN D3) 50 MCG (2000 UT) TABS Take 2,000 Units by mouth daily.   empagliflozin (JARDIANCE) 25 MG TABS tablet Take 25 mg by mouth daily.   folic acid (FOLVITE) 1 MG tablet Take 1,000 mcg by mouth daily.   Ibuprofen -Acetaminophen  (ADVIL  DUAL ACTION) 125-250 MG TABS Take 1 tablet by mouth daily as needed (Pain).   lisinopril (ZESTRIL)  2.5 MG tablet Take 2.5 mg by mouth daily.   naproxen sodium (ALEVE) 220 MG tablet Take 440 mg by mouth daily as needed (pain).   OZEMPIC, 0.25 OR 0.5 MG/DOSE, 2 MG/3ML SOPN Inject 2 mg into the skin once a week. (Patient not taking: Reported on 09/15/2024)   Phenylephrine  HCl (NEO-SYNEPHRINE NA) Place 1 spray into both nostrils daily as needed.   tadalafil (CIALIS) 5 MG tablet Take 5 mg by mouth daily.   tamsulosin (FLOMAX) 0.4 MG CAPS capsule Take 0.4 mg by mouth daily.    thiamine (VITAMIN B-1) 100 MG tablet Take 100 mg by mouth daily.   vitamin C (ASCORBIC ACID) 500 MG  tablet Take 500 mg by mouth daily.   zinc gluconate 50 MG tablet Take 50 mg by mouth daily.   No current facility-administered medications for this visit. (Other)   REVIEW OF SYSTEMS: ROS   Positive for: Eyes Last edited by Resa Delon ORN, COT on 09/21/2024  8:54 AM.     ALLERGIES No Known Allergies  PAST MEDICAL HISTORY Past Medical History:  Diagnosis Date   Cirrhosis (HCC)    Diabetes mellitus    resolution of symptoms   History of kidney stones    Hypertension    no longer on meds   PONV (postoperative nausea and vomiting)    Sciatica    Sleep apnea    Past Surgical History:  Procedure Laterality Date   ANTERIOR VITRECTOMY Right 05/26/2024   Procedure: VITRECTOMY, ANTERIOR;  Surgeon: [name], MD;  Location: [facility];  Service: Ophthalmology;  Laterality: Right;   BIOPSY  11/09/2018   Procedure: BIOPSY;  Surgeon: Shaaron Lamar HERO, MD;  Location: [facility];  Service: Endoscopy;;  gastric    CATARACT EXTRACTION W/PHACO Right 05/26/2024   Procedure: PHACOEMULSIFICATION, CATARACT, WITH IOL INSERTION;  Surgeon: [name], MD;  Location: [facility];  Service: Ophthalmology;  Laterality: Right;  CDE: 4.08   COLONOSCOPY  11/20/2005   RMR: Minimal hemorrhoids, otherwise normal   COLONOSCOPY N/A 03/25/2016   three semi-pedunculated polyps 4-6 mm in size, multiple medium-mouthed sigmoid diverticula (hyperplastic).    COLONOSCOPY WITH PROPOFOL  N/A 01/02/2021   normal   ESOPHAGOGASTRODUODENOSCOPY N/A 11/09/2018   11/09/2018 with erosive reflux esophagitis, erythematous mucosa in the stomach status post biopsy found to be mild chronic gastritis.   ESOPHAGOGASTRODUODENOSCOPY N/A 04/26/2024   Procedure: EGD (ESOPHAGOGASTRODUODENOSCOPY);  Surgeon: Shaaron Lamar HERO, MD;  Location: [facility];  Service: Endoscopy;  Laterality: N/A;  11:00 AM , ASA 3   ESOPHAGOGASTRODUODENOSCOPY (EGD) WITH PROPOFOL  N/A 01/02/2021   normal esophagus, portal gastropathy that was mild,  normal duodenum.   HEMORRHOID SURGERY     HERNIA REPAIR     INSERTION, STENT, DRUG-ELUTING, LACRIMAL CANALICULUS Right 05/26/2024   Procedure: INSERTION, STENT, DRUG-ELUTING, LACRIMAL CANALICULUS;  Surgeon: [name], MD;  Location: [facility];  Service: Ophthalmology;  Laterality: Right;   LITHOTRIPSY     PARS PLANA VITRECTOMY Right 09/14/2024   Procedure: PARS PLANA VITRECTOMY 25 GAUGE WITH ENDOLASER;  Surgeon: [name], MD;  Location: [facility] OR;  Service: Ophthalmology;  Laterality: Right;   TONSILLECTOMY     TOOTH EXTRACTION     FAMILY HISTORY Family History  Problem Relation Age of Onset   Leukemia Father    Hypothyroidism Brother    Glaucoma Brother    Rectal cancer Brother 45       s/p surgical resection; dx 2 years ago   Osteoarthritis Other    Prostate cancer Paternal  Grandfather    Cirrhosis Cousin    SOCIAL HISTORY Social History   Tobacco Use   Smoking status: Former   Smokeless tobacco: Former    Types: Chew    Quit date: 11/02/1978  Vaping Use   Vaping status: Never Used  Substance Use Topics   Alcohol use: Not Currently    Alcohol/week: 7.0 standard drinks of alcohol    Types: 7 Standard drinks or equivalent per week    Comment: 2-3 at a time; 2-3 times a week; previously: none in 2019 but did have 1 drink in June on vacation.   Drug use: No       OPHTHALMIC EXAM:  Base Eye Exam     Visual Acuity (Snellen - Linear)       Right Left   Dist cc 20/250 20/25   Dist ph cc 20/100     Correction: Glasses         Tonometry (Tonopen, 9:00 AM)       Right Left   Pressure 17 19         Pupils       Pupils Dark Light Shape React APD   Right PERRL 5 5 Round NA    Left PERRL 4 3 Round Brisk None         Visual Fields       Left Right    Full Full         Extraocular Movement       Right Left    Full, Ortho Full, Ortho         Neuro/Psych     Oriented x3: Yes   Mood/Affect: Normal         Dilation     Right eye:  2.5% Phenylephrine  @ 8:57 AM           Slit Lamp and Fundus Exam     External Exam       Right Left   External Normal Normal         Slit Lamp Exam       Right Left   Lids/Lashes Dermatochalasis Dermatochalasis, mild Ptosis   Conjunctiva/Sclera White and quiet, temporal pinguecula, Subconjunctival hemorrhage 360, Sutures intact White and quiet   Cornea 2+ fine PEE, tear film debris, Well healed temporal cataract wound, Arcus trace tear film debris   Anterior Chamber Deep, narrow angle, 1+ fine Cell/pigment Deep, narrow temporal angle   Iris Peaked pupil to 630, well dilated, No NVI, IK touch 0630 Round and dilated, no NVI   Lens PC IOL in good position, fine pigment on optic trace, trace Posterior capsular opacification 2-3+ Cortical cataract, 2-3+ Nuclear sclerosis   Anterior Vitreous Post vitrectomy, clear Vitreous syneresis         Fundus Exam       Right Left   Disc 2-3+ pallor, sharp rim Pink and Sharp   C/D Ratio 0.4 0.6   Macula Flat, good foveal reflex, RPE mottling, pigment choroidal lesion along ST arcades, No heme or edema Flat, Blunted foveal reflex, RPE mottling, no heme or edema   Vessels Vascular attenuation, mild tortuosity Attenuated, mild tortuosity   Periphery Attached, good peripheral laser changes 360, Hazy view improved, choroidal effusion stably resolved, scattered MA/DBH Attached, no heme           Refraction     Wearing Rx       Sphere Cylinder Axis Add   Right +0.25 +1.25 167 +2.25   Left -0.50 +2.00  179 +2.25           IMAGING AND PROCEDURES  Imaging and Procedures for 09/21/2024  OCT, Retina - OU - Both Eyes       Right Eye Quality was good. Central Foveal Thickness: 285. Progression has been stable. Findings include normal foveal contour, no IRF, no SRF ( Stable improvement in vitreous opacities).   Left Eye Quality was good. Central Foveal Thickness: 282. Progression has been stable. Findings include normal foveal  contour, no IRF, no SRF, vitreomacular adhesion .   Notes *Images captured and stored on drive  Diagnosis / Impression:  OD: Stable improvement in vitreous opacities OS: NFP; no IRF/SRF   Clinical management:  See below  Abbreviations: NFP - Normal foveal profile. CME - cystoid macular edema. PED - pigment epithelial detachment. IRF - intraretinal fluid. SRF - subretinal fluid. EZ - ellipsoid zone. ERM - epiretinal membrane. ORA - outer retinal atrophy. ORT - outer retinal tubulation. SRHM - subretinal hyper-reflective material. IRHM - intraretinal hyper-reflective material           ASSESSMENT/PLAN:   ICD-10-CM   1. Vitreous hemorrhage of right eye (HCC)  H43.11 OCT, Retina - OU - Both Eyes    2. Choroidal effusion  H31.8     3. 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 Diabetes mellitus treated with injections of non-insulin  medication (HCC)  E11.9    Z79.85     5. Essential hypertension  I10     6. Hypertensive retinopathy of both eyes  H35.033     7. Pseudophakia of right eye  Z96.1     8. Combined forms of age-related cataract of left eye  H25.812      **S/p CEIOL OD on 07.25.25 complicated by ant vitreous prolapse and underwent limited anterior vitrectomy. - developed choroidal effusion and vit hemorrhage after surgery  1. Vitreous hemorrhage OD  - diffuse VH following cataract surgery - b-scan 08.08.25 shows diffuse vit opacities consistent with VH, no obvious RT/RD; +choroidal effusion (see below)  - s/p 25g PPV w/ EL OD for clearing out debris and VH OD on 11.13.25 - Continue OD drops:  -Pred 4x/day   -Atropine  BID--ok to d/c today  -Zymaxid  4x/day--d/c Monday 11.24.25  -Brim BID--ok to d/c today  -Cosopt  BID  -PSO ung QID/PRN OD  -gtt sheet updated and given 11.20.25 visit - f/u in 3wks, DFE, OCT--test w/o Rx Specs OD next visit  2. Choroidal Effusion OD - improved, stable  3,4. Diabetes mellitus, type 2 without retinopathy -  The incidence, risk factors for progression, natural history and treatment options for diabetic retinopathy were discussed with patient.   - The need for close monitoring of blood glucose, blood pressure, and serum lipids, avoiding cigarette or any type of tobacco, and the need for long term follow up was also discussed with patient. - f/u in 1 year, sooner prn  5,6. Hypertensive retinopathy OU - discussed importance of tight BP control - monitor  7. Pseudophakia OD  - s/p CE/IOL OD  - IOL in good position -- VH and choroidal effusion (see above)  - monitor  8. Mixed Cataract OS - The symptoms of cataract, surgical options, and treatments and risks were discussed with patient. - discussed diagnosis and progression - under the expert management of Dr. Juli  Ophthalmic Meds Ordered this visit:  Meds ordered this encounter  Medications   bacitracin -polymyxin b  (POLYSPORIN ) ophthalmic ointment    Sig: Place  into both eyes 4 (four) times daily for 10 days. Place a 1/4 inch ribbon of ointment into the lower eyelid.    Dispense:  3.5 g    Refill:  3   prednisoLONE  acetate (PRED FORTE ) 1 % ophthalmic suspension    Sig: Place 1 drop into the right eye 4 (four) times daily.    Dispense:  10 mL    Refill:  3     Return in about 3 weeks (around 10/12/2024) for POV WK2 OD, DFE, OCT OU.  There are no Patient Instructions on file for this visit.  Explained the diagnoses, plan, and follow up with the patient and they expressed understanding.  Patient expressed understanding of the importance of proper follow up care.   This document serves as a record of services personally performed by Redell JUDITHANN Hans, MD, PhD. It was created on their behalf by Auston Muzzy, COMT. The creation of this record is the provider's dictation and/or activities during the visit.  Electronically signed by: Auston Muzzy, COMT 09/24/24 8:17 PM  This document serves as a record of services personally performed by  Redell JUDITHANN Hans, MD, PhD. It was created on their behalf by Almetta Pesa, an ophthalmic technician. The creation of this record is the provider's dictation and/or activities during the visit.    Electronically signed by: Almetta Pesa, OA, 09/24/24  8:17 PM  Redell JUDITHANN Hans, M.D., Ph.D. Diseases & Surgery of the Retina and Vitreous [facility]  I have reviewed the above documentation for accuracy and completeness, and I agree with the above. Redell JUDITHANN Hans, M.D., Ph.D. 09/24/24 8:19 PM   Abbreviations: M myopia (nearsighted); A astigmatism; H hyperopia (farsighted); P presbyopia; Mrx spectacle prescription;  CTL contact lenses; OD right eye; OS left eye; OU both eyes  XT exotropia; ET esotropia; PEK punctate epithelial keratitis; PEE punctate epithelial erosions; DES dry eye syndrome; MGD meibomian gland dysfunction; ATs artificial tears; PFAT's preservative free artificial tears; NSC nuclear sclerotic cataract; PSC posterior subcapsular cataract; ERM epi-retinal membrane; PVD posterior vitreous detachment; RD retinal detachment; DM diabetes mellitus; DR diabetic retinopathy; NPDR non-proliferative diabetic retinopathy; PDR proliferative diabetic retinopathy; CSME clinically significant macular edema; DME diabetic macular edema; dbh dot blot hemorrhages; CWS cotton wool spot; POAG primary open angle glaucoma; C/D cup-to-disc ratio; HVF humphrey visual field; GVF goldmann visual field; OCT optical coherence tomography; IOP intraocular pressure; BRVO Branch retinal vein occlusion; CRVO central retinal vein occlusion; CRAO central retinal artery occlusion; BRAO branch retinal artery occlusion; RT retinal tear; SB scleral buckle; PPV pars plana vitrectomy; VH Vitreous hemorrhage; PRP panretinal laser photocoagulation; IVK intravitreal kenalog ; VMT vitreomacular traction; MH Macular hole;  NVD neovascularization of the disc; NVE neovascularization elsewhere; AREDS age related eye  disease study; ARMD age related macular degeneration; POAG primary open angle glaucoma; EBMD epithelial/anterior basement membrane dystrophy; ACIOL anterior chamber intraocular lens; IOL intraocular lens; PCIOL posterior chamber intraocular lens; Phaco/IOL phacoemulsification with intraocular lens placement; PRK photorefractive keratectomy; LASIK laser assisted in situ keratomileusis; HTN hypertension; DM diabetes mellitus; COPD chronic obstructive pulmonary disease

## 2024-09-21 ENCOUNTER — Encounter (INDEPENDENT_AMBULATORY_CARE_PROVIDER_SITE_OTHER): Payer: Self-pay | Admitting: Ophthalmology

## 2024-09-21 ENCOUNTER — Ambulatory Visit (INDEPENDENT_AMBULATORY_CARE_PROVIDER_SITE_OTHER): Admitting: Ophthalmology

## 2024-09-21 DIAGNOSIS — H318 Other specified disorders of choroid: Secondary | ICD-10-CM | POA: Diagnosis not present

## 2024-09-21 DIAGNOSIS — H35033 Hypertensive retinopathy, bilateral: Secondary | ICD-10-CM | POA: Diagnosis not present

## 2024-09-21 DIAGNOSIS — I10 Essential (primary) hypertension: Secondary | ICD-10-CM

## 2024-09-21 DIAGNOSIS — Z7985 Long-term (current) use of injectable non-insulin antidiabetic drugs: Secondary | ICD-10-CM

## 2024-09-21 DIAGNOSIS — H25812 Combined forms of age-related cataract, left eye: Secondary | ICD-10-CM

## 2024-09-21 DIAGNOSIS — E119 Type 2 diabetes mellitus without complications: Secondary | ICD-10-CM

## 2024-09-21 DIAGNOSIS — H4311 Vitreous hemorrhage, right eye: Secondary | ICD-10-CM

## 2024-09-21 DIAGNOSIS — Z961 Presence of intraocular lens: Secondary | ICD-10-CM

## 2024-09-21 MED ORDER — BACITRACIN-POLYMYXIN B 500-10000 UNIT/GM OP OINT: TOPICAL_OINTMENT | Freq: Four times a day (QID) | OPHTHALMIC | 3 refills | Status: AC

## 2024-09-21 MED ORDER — PREDNISOLONE ACETATE 1 % OP SUSP: 1.0000 [drp] | Freq: Four times a day (QID) | OPHTHALMIC | 3 refills | Status: AC

## 2024-09-24 ENCOUNTER — Encounter (INDEPENDENT_AMBULATORY_CARE_PROVIDER_SITE_OTHER): Payer: Self-pay | Admitting: Ophthalmology

## 2024-10-11 NOTE — Progress Notes (Signed)
 [facility] - Clinic Note  10/12/2024   CHIEF COMPLAINT Patient presents for Retina Follow Up  HISTORY OF PRESENT ILLNESS: Eric Dougherty is a 73 y.o. male who presents to the clinic today for:  HPI     Retina Follow Up   In right eye.  This started 6 days ago.  Duration of 6 days.  Since onset it is stable.  I, the attending physician,  performed the HPI with the patient and updated documentation appropriately.        Comments   Pt states vision has been about the same pt has been consistent using Pred 4x/day, Cosopt  BID. Pt has been out of zymaxid  for about 1 week and has not needed to use the PSO for a week as well. Pt still sees the same minor flashes and floaters. Pt has some minor discomfort and is taking Tylenol  occasionally.         Last edited by [name], MD on 10/15/2024  8:32 PM.     Pt states vision is the same. Doesn't notice much improvement OD. Has not needed to use ointment-no irritation or itching.   Referring physician: [name], MD [address] [place],  [place] [address]  HISTORICAL INFORMATION:  Selected notes from the MEDICAL RECORD NUMBER Referred by Dr. Juli for [facility] and choroidal effusion OD following cataract surgery LEE:  Ocular Hx- PMH-   CURRENT MEDICATIONS: Current Outpatient Medications (Ophthalmic Drugs)  Medication Sig   dorzolamide -timolol  (COSOPT ) 2-0.5 % ophthalmic solution Place 1 drop into both eyes 2 (two) times daily.   atropine  1 % ophthalmic solution Place 1 drop into the right eye daily.   brimonidine  (ALPHAGAN ) 0.2 % ophthalmic solution Place 1 drop into the right eye 3 (three) times daily.   DORZOLAMIDE  HCL OP Place 1 drop into both eyes 3 (three) times daily.   latanoprost (XALATAN) 0.005 % ophthalmic solution Place 1 drop into both eyes at bedtime.   moxifloxacin  (VIGAMOX ) 0.5 % ophthalmic solution Place 1 drop into the right eye 4 (four) times daily. (Patient not taking: Reported on  09/15/2024)   Polyethyl Glycol-Propyl Glycol (SYSTANE OP) Place 1 drop into both eyes See admin instructions. 2-4 times daily   prednisoLONE  acetate (PRED FORTE ) 1 % ophthalmic suspension Place 1 drop into the right eye 4 (four) times daily.   prednisoLONE  acetate (PRED FORTE ) 1 % ophthalmic suspension Place 1 drop into the right eye 4 (four) times daily.   ROCKLATAN 0.02-0.005 % SOLN Apply 1 drop to eye at bedtime. (Patient not taking: Reported on 09/15/2024)   timolol  (TIMOPTIC ) 0.5 % ophthalmic solution Place into both eyes 2 (two) times daily.   No current facility-administered medications for this visit. (Ophthalmic Drugs)   Current Outpatient Medications (Other)  Medication Sig   acetaZOLAMIDE  (DIAMOX ) 250 MG tablet Take 500 mg by mouth 2 (two) times daily. (Patient not taking: Reported on 09/15/2024)   aspirin EC 81 MG tablet Take 81 mg by mouth daily.   atorvastatin  (LIPITOR) 40 MG tablet TAKE 1 TABLET ONCE DAILY. (Patient taking differently: Take 40 mg by mouth daily.)   Cholecalciferol (VITAMIN D3) 50 MCG (2000 UT) TABS Take 2,000 Units by mouth daily.   empagliflozin (JARDIANCE) 25 MG TABS tablet Take 25 mg by mouth daily.   folic acid (FOLVITE) 1 MG tablet Take 1,000 mcg by mouth daily.   Ibuprofen -Acetaminophen  (ADVIL  DUAL ACTION) 125-250 MG TABS Take 1 tablet by mouth daily as needed (Pain).  lisinopril (ZESTRIL) 2.5 MG tablet Take 2.5 mg by mouth daily.   naproxen sodium (ALEVE) 220 MG tablet Take 440 mg by mouth daily as needed (pain).   OZEMPIC, 0.25 OR 0.5 MG/DOSE, 2 MG/3ML SOPN Inject 2 mg into the skin once a week. (Patient not taking: Reported on 09/15/2024)   Phenylephrine  HCl (NEO-SYNEPHRINE NA) Place 1 spray into both nostrils daily as needed.   tadalafil (CIALIS) 5 MG tablet Take 5 mg by mouth daily.   tamsulosin (FLOMAX) 0.4 MG CAPS capsule Take 0.4 mg by mouth daily.    thiamine (VITAMIN B-1) 100 MG tablet Take 100 mg by mouth daily.   vitamin C (ASCORBIC ACID)  500 MG tablet Take 500 mg by mouth daily.   zinc gluconate 50 MG tablet Take 50 mg by mouth daily.   No current facility-administered medications for this visit. (Other)   REVIEW OF SYSTEMS: ROS   Positive for: Eyes Last edited by Elnor Avelina RAMAN, COT on 10/12/2024  7:49 AM.      ALLERGIES No Known Allergies  PAST MEDICAL HISTORY Past Medical History:  Diagnosis Date   Cirrhosis (HCC)    Diabetes mellitus    resolution of symptoms   History of kidney stones    Hypertension    no longer on meds   PONV (postoperative nausea and vomiting)    Sciatica    Sleep apnea    Past Surgical History:  Procedure Laterality Date   ANTERIOR VITRECTOMY Right 05/26/2024   Procedure: VITRECTOMY, ANTERIOR;  Surgeon: [name], MD;  Location: [facility];  Service: Ophthalmology;  Laterality: Right;   BIOPSY  11/09/2018   Procedure: BIOPSY;  Surgeon: Shaaron Lamar HERO, MD;  Location: [facility];  Service: Endoscopy;;  gastric    CATARACT EXTRACTION W/PHACO Right 05/26/2024   Procedure: PHACOEMULSIFICATION, CATARACT, WITH IOL INSERTION;  Surgeon: [name], MD;  Location: [facility];  Service: Ophthalmology;  Laterality: Right;  CDE: 4.08   COLONOSCOPY  11/20/2005   RMR: Minimal hemorrhoids, otherwise normal   COLONOSCOPY N/A 03/25/2016   three semi-pedunculated polyps 4-6 mm in size, multiple medium-mouthed sigmoid diverticula (hyperplastic).    COLONOSCOPY WITH PROPOFOL  N/A 01/02/2021   normal   ESOPHAGOGASTRODUODENOSCOPY N/A 11/09/2018   11/09/2018 with erosive reflux esophagitis, erythematous mucosa in the stomach status post biopsy found to be mild chronic gastritis.   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   HEMORRHOID SURGERY     HERNIA REPAIR     INSERTION, STENT, DRUG-ELUTING, LACRIMAL CANALICULUS Right 05/26/2024   Procedure: INSERTION, STENT, DRUG-ELUTING, LACRIMAL CANALICULUS;  Surgeon: [name], MD;  Location: [facility];  Service: Ophthalmology;  Laterality: Right;   LITHOTRIPSY     PARS PLANA VITRECTOMY Right 09/14/2024   Procedure: PARS PLANA VITRECTOMY 25 GAUGE WITH ENDOLASER;  Surgeon: [name], MD;  Location: [facility] OR;  Service: Ophthalmology;  Laterality: Right;   TONSILLECTOMY     TOOTH EXTRACTION     FAMILY HISTORY Family History  Problem Relation Age of Onset   Leukemia Father    Hypothyroidism Brother    Glaucoma Brother    Rectal cancer Brother 52       s/p surgical resection; dx 2 years ago   Osteoarthritis Other  Prostate cancer Paternal Grandfather    Cirrhosis Cousin    SOCIAL HISTORY Social History   Tobacco Use   Smoking status: Former   Smokeless tobacco: Former    Types: Chew    Quit date: 11/02/1978  Vaping Use   Vaping status: Never Used  Substance Use Topics   Alcohol use: Not Currently    Alcohol/week: 7.0 standard drinks of alcohol    Types: 7 Standard drinks or equivalent per week    Comment: 2-3 at a time; 2-3 times a week; previously: none in 2019 but did have 1 drink in June on vacation.   Drug use: No       OPHTHALMIC EXAM:  Base Eye Exam     Visual Acuity (Snellen - Linear)       Right Left   Dist [place] 20/250 -2    Dist cc 20/250 20/25   Dist ph [place] 20/150    Dist ph cc 20/150 +2 NI    Correction: Glasses         Tonometry (Tonopen, 7:56 AM)       Right Left   Pressure 23 26         Pupils       Dark Light Shape React APD   Right 4 4 Irregular NR None   Left 4 3 Round Brisk NR         Visual Fields       Left Right    Full Full         Extraocular Movement       Right Left    Full, Ortho Full, Ortho         Neuro/Psych     Oriented x3: Yes   Mood/Affect: Normal          Dilation     Both eyes: 1.0% Mydriacyl , 2.5% Phenylephrine  @ 7:57 AM           Slit Lamp and Fundus Exam     External Exam       Right Left   External Normal Normal         Slit Lamp Exam       Right Left   Lids/Lashes Dermatochalasis Dermatochalasis, mild Ptosis   Conjunctiva/Sclera White and quiet, temporal pinguecula, Subconjunctival hemorrhage 360--improved, Sutures intact White and quiet   Cornea Arcus, 2+ fine PEE, irregular tear film, Well healed temporal cataract wound, well healed lasik flap scar trace tear film debris   Anterior Chamber Deep, narrow angle, 0.5+ fine Cell/pigment Deep, narrow temporal angle   Iris Peaked pupil to 630, well dilated, No NVI, IK touch 0630 Round and dilated, no NVI   Lens PC IOL in good position, fine pigment on optic trace, trace Posterior capsular opacification 2-3+ Cortical cataract, 2-3+ Nuclear sclerosis   Anterior Vitreous Post vitrectomy, clear Vitreous syneresis         Fundus Exam       Right Left   Disc 2-3+ pallor, sharp rim Pink and Sharp   C/D Ratio 0.4 0.6   Macula Flat, good foveal reflex, RPE mottling, pigment choroidal lesion along ST arcades, No heme or edema Flat, Blunted foveal reflex, RPE mottling, no heme or edema   Vessels Vascular attenuation, mild tortuosity Attenuated, mild tortuosity   Periphery Attached, good peripheral laser changes 360, Hazy view improved, choroidal effusion stably resolved, scattered MA/DBH Attached, no heme           Refraction     Wearing Rx  Sphere Cylinder Axis Add   Right +0.25 +1.25 167 +2.25   Left -0.50 +2.00 179 +2.25           IMAGING AND PROCEDURES  Imaging and Procedures for 10/12/2024  OCT, Retina - OU - Both Eyes       Right Eye Quality was borderline. Central Foveal Thickness: 294. Progression has been stable. Findings include normal foveal contour, no IRF, no SRF (Grainy images; Stable improvement in vitreous opacities).   Left  Eye Quality was good. Central Foveal Thickness: 286. Progression has been stable. Findings include normal foveal contour, no IRF, no SRF, vitreomacular adhesion .   Notes *Images captured and stored on drive  Diagnosis / Impression:  OD: grainy images; Stable improvement in vitreous opacities OS: NFP; no IRF/SRF   Clinical management:  See below  Abbreviations: NFP - Normal foveal profile. CME - cystoid macular edema. PED - pigment epithelial detachment. IRF - intraretinal fluid. SRF - subretinal fluid. EZ - ellipsoid zone. ERM - epiretinal membrane. ORA - outer retinal atrophy. ORT - outer retinal tubulation. SRHM - subretinal hyper-reflective material. IRHM - intraretinal hyper-reflective material            ASSESSMENT/PLAN:   ICD-10-CM   1. Vitreous hemorrhage of right eye (HCC)  H43.11 OCT, Retina - OU - Both Eyes    2. Choroidal effusion  H31.8     3. 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 Diabetes mellitus treated with injections of non-insulin  medication (HCC)  E11.9    Z79.85     5. Essential hypertension  I10     6. Hypertensive retinopathy of both eyes  H35.033 OCT, Retina - OU - Both Eyes    7. Pseudophakia of right eye  Z96.1     8. Combined forms of age-related cataract of left eye  H25.812       **S/p CEIOL OD on 07.25.25 complicated by ant vitreous prolapse and underwent limited anterior vitrectomy. - developed choroidal effusion and vit hemorrhage after surgery  1. Vitreous hemorrhage OD  - diffuse VH following cataract surgery - b-scan 08.08.25 shows diffuse vit opacities consistent with VH, no obvious RT/RD; +choroidal effusion (see below)  - s/p 25g PPV w/ EL OD for clearing out debris and VH OD on 11.13.25 - BCVA OD 20/150 - Exam on 12.11.25 shows 2+ fine PEE and irregular tear film OD--increase AT use (below) - Continue OD drops:  -Pred 4x/day--decrease to BID x 2 weeks, once daily x 2 weeks then stop.    -Cosopt   BID--continue  -Use Ats QID then lubricating ointment QHS - gtt sheet updated and given 12.11.25 visit - f/u in 4 wks, DFE, OCT  2. Choroidal Effusion OD - improved, stable  3,4. Diabetes mellitus, type 2 without retinopathy - The incidence, risk factors for progression, natural history and treatment options for diabetic retinopathy were discussed with patient.   - The need for close monitoring of blood glucose, blood pressure, and serum lipids, avoiding cigarette or any type of tobacco, and the need for long term follow up was also discussed with patient. - f/u in 1 year, sooner prn  5,6. Hypertensive retinopathy OU - discussed importance of tight BP control - monitor  7. Pseudophakia OD  - s/p CE/IOL OD  - IOL in good position -- VH and choroidal effusion (see above)  - monitor  8. Mixed Cataract OS - The symptoms of cataract, surgical options, and treatments and risks were  discussed with patient. - discussed diagnosis and progression - under the expert management of Dr. Juli  Ophthalmic Meds Ordered this visit:  No orders of the defined types were placed in this encounter.    Return in about 4 weeks (around 11/09/2024) for POV VH OD, DFE, OCT.  There are no Patient Instructions on file for this visit.  Explained the diagnoses, plan, and follow up with the patient and they expressed understanding.  Patient expressed understanding of the importance of proper follow up care.   This document serves as a record of services personally performed by Redell JUDITHANN Hans, MD, PhD. It was created on their behalf by Auston Muzzy, COMT. The creation of this record is the provider's dictation and/or activities during the visit.  Electronically signed by: Auston Muzzy, COMT 10/15/2024 8:34 PM  This document serves as a record of services personally performed by Redell JUDITHANN Hans, MD, PhD. It was created on their behalf by Almetta Pesa, an ophthalmic technician. The creation of this record  is the provider's dictation and/or activities during the visit.    Electronically signed by: Almetta Pesa, OA, 10/15/2024  8:34 PM  Redell JUDITHANN Hans, M.D., Ph.D. Diseases & Surgery of the Retina and Vitreous [facility]  I have reviewed the above documentation for accuracy and completeness, and I agree with the above. Redell JUDITHANN Hans, M.D., Ph.D. 10/15/2024 8:36 PM   Abbreviations: M myopia (nearsighted); A astigmatism; H hyperopia (farsighted); P presbyopia; Mrx spectacle prescription;  CTL contact lenses; OD right eye; OS left eye; OU both eyes  XT exotropia; ET esotropia; PEK punctate epithelial keratitis; PEE punctate epithelial erosions; DES dry eye syndrome; MGD meibomian gland dysfunction; ATs artificial tears; PFAT's preservative free artificial tears; NSC nuclear sclerotic cataract; PSC posterior subcapsular cataract; ERM epi-retinal membrane; PVD posterior vitreous detachment; RD retinal detachment; DM diabetes mellitus; DR diabetic retinopathy; NPDR non-proliferative diabetic retinopathy; PDR proliferative diabetic retinopathy; CSME clinically significant macular edema; DME diabetic macular edema; dbh dot blot hemorrhages; CWS cotton wool spot; POAG primary open angle glaucoma; C/D cup-to-disc ratio; HVF humphrey visual field; GVF goldmann visual field; OCT optical coherence tomography; IOP intraocular pressure; BRVO Branch retinal vein occlusion; CRVO central retinal vein occlusion; CRAO central retinal artery occlusion; BRAO branch retinal artery occlusion; RT retinal tear; SB scleral buckle; PPV pars plana vitrectomy; VH Vitreous hemorrhage; PRP panretinal laser photocoagulation; IVK intravitreal kenalog ; VMT vitreomacular traction; MH Macular hole;  NVD neovascularization of the disc; NVE neovascularization elsewhere; AREDS age related eye disease study; ARMD age related macular degeneration; POAG primary open angle glaucoma; EBMD epithelial/anterior basement  membrane dystrophy; ACIOL anterior chamber intraocular lens; IOL intraocular lens; PCIOL posterior chamber intraocular lens; Phaco/IOL phacoemulsification with intraocular lens placement; PRK photorefractive keratectomy; LASIK laser assisted in situ keratomileusis; HTN hypertension; DM diabetes mellitus; COPD chronic obstructive pulmonary disease

## 2024-10-12 ENCOUNTER — Ambulatory Visit (INDEPENDENT_AMBULATORY_CARE_PROVIDER_SITE_OTHER): Admitting: Ophthalmology

## 2024-10-12 DIAGNOSIS — H25812 Combined forms of age-related cataract, left eye: Secondary | ICD-10-CM

## 2024-10-12 DIAGNOSIS — H35033 Hypertensive retinopathy, bilateral: Secondary | ICD-10-CM | POA: Diagnosis not present

## 2024-10-12 DIAGNOSIS — E119 Type 2 diabetes mellitus without complications: Secondary | ICD-10-CM

## 2024-10-12 DIAGNOSIS — H318 Other specified disorders of choroid: Secondary | ICD-10-CM

## 2024-10-12 DIAGNOSIS — I10 Essential (primary) hypertension: Secondary | ICD-10-CM | POA: Diagnosis not present

## 2024-10-12 DIAGNOSIS — H4311 Vitreous hemorrhage, right eye: Secondary | ICD-10-CM | POA: Diagnosis not present

## 2024-10-12 DIAGNOSIS — Z7985 Long-term (current) use of injectable non-insulin antidiabetic drugs: Secondary | ICD-10-CM | POA: Diagnosis not present

## 2024-10-12 DIAGNOSIS — Z961 Presence of intraocular lens: Secondary | ICD-10-CM

## 2024-10-15 ENCOUNTER — Encounter (INDEPENDENT_AMBULATORY_CARE_PROVIDER_SITE_OTHER): Payer: Self-pay | Admitting: Ophthalmology

## 2024-11-03 NOTE — Progress Notes (Signed)
 " [facility] - Clinic Note  11/10/2024   CHIEF COMPLAINT Patient presents for Retina Follow Up  HISTORY OF PRESENT ILLNESS: Eric Dougherty is a 74 y.o. male who presents to the clinic today for:  HPI     Retina Follow Up   In right eye.  This started 6 days ago.  Duration of 6 days.  Since onset it is stable.  I, the attending physician,  performed the HPI with the patient and updated documentation appropriately.        Comments   Pt states vision has not improved but he feels like he has become used to it. Pt still sees the same minor flashes and floaters. Pt might use Tylenol  once a week. Pt was consistent with the drop regimen:             -Pred once daily x 2 weeks then stop.               -Cosopt  BID             -Use Ats QID, not needing lubricating ointment QHS         Last edited by Elnor Avelina RAMAN, COT on 11/10/2024  9:04 AM.      Pt states he has not seen Dr. Juli. He was told he was being  turned over to Dr. Valdemar. Reports compliance w/ gtts, pt did use his cosopt  this am  Referring physician: [name], MD [address] [place],  [place] [address]  HISTORICAL INFORMATION:  Selected notes from the MEDICAL RECORD NUMBER Referred by Dr. Juli for [facility] and choroidal effusion OD following cataract surgery LEE:  Ocular Hx- PMH-   CURRENT MEDICATIONS: Current Outpatient Medications (Ophthalmic Drugs)  Medication Sig   atropine  1 % ophthalmic solution Place 1 drop into the right eye daily.   brimonidine  (ALPHAGAN ) 0.2 % ophthalmic solution Place 1 drop into the right eye 3 (three) times daily.   DORZOLAMIDE  HCL OP Place 1 drop into both eyes 3 (three) times daily.   dorzolamide -timolol  (COSOPT ) 2-0.5 % ophthalmic solution Place 1 drop into both eyes 2 (two) times daily.   latanoprost (XALATAN) 0.005 % ophthalmic solution Place 1 drop into both eyes at bedtime.   moxifloxacin  (VIGAMOX ) 0.5 % ophthalmic solution Place 1 drop into the  right eye 4 (four) times daily. (Patient not taking: Reported on 09/15/2024)   Polyethyl Glycol-Propyl Glycol (SYSTANE OP) Place 1 drop into both eyes See admin instructions. 2-4 times daily   prednisoLONE  acetate (PRED FORTE ) 1 % ophthalmic suspension Place 1 drop into the right eye 4 (four) times daily.   prednisoLONE  acetate (PRED FORTE ) 1 % ophthalmic suspension Place 1 drop into the right eye 4 (four) times daily.   ROCKLATAN 0.02-0.005 % SOLN Apply 1 drop to eye at bedtime. (Patient not taking: Reported on 09/15/2024)   timolol  (TIMOPTIC ) 0.5 % ophthalmic solution Place into both eyes 2 (two) times daily.   No current facility-administered medications for this visit. (Ophthalmic Drugs)   Current Outpatient Medications (Other)  Medication Sig   acetaZOLAMIDE  (DIAMOX ) 250 MG tablet Take 500 mg by mouth 2 (two) times daily. (Patient not taking: Reported on 09/15/2024)   aspirin EC 81 MG tablet Take 81 mg by mouth daily.   atorvastatin  (LIPITOR) 40 MG tablet TAKE 1 TABLET ONCE DAILY. (Patient taking differently: Take 40 mg by mouth daily.)   Cholecalciferol (VITAMIN D3) 50 MCG (2000 UT) TABS Take 2,000 Units by mouth  daily.   empagliflozin (JARDIANCE) 25 MG TABS tablet Take 25 mg by mouth daily.   folic acid (FOLVITE) 1 MG tablet Take 1,000 mcg by mouth daily.   Ibuprofen -Acetaminophen  (ADVIL  DUAL ACTION) 125-250 MG TABS Take 1 tablet by mouth daily as needed (Pain).   lisinopril (ZESTRIL) 2.5 MG tablet Take 2.5 mg by mouth daily.   naproxen sodium (ALEVE) 220 MG tablet Take 440 mg by mouth daily as needed (pain).   OZEMPIC, 0.25 OR 0.5 MG/DOSE, 2 MG/3ML SOPN Inject 2 mg into the skin once a week. (Patient not taking: Reported on 09/15/2024)   Phenylephrine  HCl (NEO-SYNEPHRINE NA) Place 1 spray into both nostrils daily as needed.   tadalafil (CIALIS) 5 MG tablet Take 5 mg by mouth daily.   tamsulosin (FLOMAX) 0.4 MG CAPS capsule Take 0.4 mg by mouth daily.    thiamine (VITAMIN B-1) 100 MG  tablet Take 100 mg by mouth daily.   vitamin C (ASCORBIC ACID) 500 MG tablet Take 500 mg by mouth daily.   zinc gluconate 50 MG tablet Take 50 mg by mouth daily.   No current facility-administered medications for this visit. (Other)   REVIEW OF SYSTEMS: ROS   Positive for: Eyes Last edited by Elnor Avelina RAMAN, COT on 11/10/2024  8:42 AM.       ALLERGIES No Known Allergies  PAST MEDICAL HISTORY Past Medical History:  Diagnosis Date   Cirrhosis (HCC)    Diabetes mellitus    resolution of symptoms   History of kidney stones    Hypertension    no longer on meds   PONV (postoperative nausea and vomiting)    Sciatica    Sleep apnea    Past Surgical History:  Procedure Laterality Date   ANTERIOR VITRECTOMY Right 05/26/2024   Procedure: VITRECTOMY, ANTERIOR;  Surgeon: [name], MD;  Location: [facility];  Service: Ophthalmology;  Laterality: Right;   BIOPSY  11/09/2018   Procedure: BIOPSY;  Surgeon: Shaaron Lamar HERO, MD;  Location: [facility];  Service: Endoscopy;;  gastric    CATARACT EXTRACTION W/PHACO Right 05/26/2024   Procedure: PHACOEMULSIFICATION, CATARACT, WITH IOL INSERTION;  Surgeon: [name], MD;  Location: [facility];  Service: Ophthalmology;  Laterality: Right;  CDE: 4.08   COLONOSCOPY  11/20/2005   RMR: Minimal hemorrhoids, otherwise normal   COLONOSCOPY N/A 03/25/2016   three semi-pedunculated polyps 4-6 mm in size, multiple medium-mouthed sigmoid diverticula (hyperplastic).    COLONOSCOPY WITH PROPOFOL  N/A 01/02/2021   normal   ESOPHAGOGASTRODUODENOSCOPY N/A 11/09/2018   11/09/2018 with erosive reflux esophagitis, erythematous mucosa in the stomach status post biopsy found to be mild chronic gastritis.   ESOPHAGOGASTRODUODENOSCOPY N/A 04/26/2024   Procedure: EGD (ESOPHAGOGASTRODUODENOSCOPY);  Surgeon: Shaaron Lamar HERO, MD;  Location: [facility];  Service: Endoscopy;  Laterality: N/A;  11:00 AM , ASA 3   ESOPHAGOGASTRODUODENOSCOPY (EGD) WITH PROPOFOL  N/A  01/02/2021   normal esophagus, portal gastropathy that was mild, normal duodenum.   HEMORRHOID SURGERY     HERNIA REPAIR     INSERTION, STENT, DRUG-ELUTING, LACRIMAL CANALICULUS Right 05/26/2024   Procedure: INSERTION, STENT, DRUG-ELUTING, LACRIMAL CANALICULUS;  Surgeon: [name], MD;  Location: [facility];  Service: Ophthalmology;  Laterality: Right;   LITHOTRIPSY     PARS PLANA VITRECTOMY Right 09/14/2024   Procedure: PARS PLANA VITRECTOMY 25 GAUGE WITH ENDOLASER;  Surgeon: [name], MD;  Location: [facility] OR;  Service: Ophthalmology;  Laterality: Right;   TONSILLECTOMY     TOOTH EXTRACTION     FAMILY  HISTORY Family History  Problem Relation Age of Onset   Leukemia Father    Hypothyroidism Brother    Glaucoma Brother    Rectal cancer Brother 27       s/p surgical resection; dx 2 years ago   Osteoarthritis Other    Prostate cancer Paternal Grandfather    Cirrhosis Cousin    SOCIAL HISTORY Social History   Tobacco Use   Smoking status: Former   Smokeless tobacco: Former    Types: Chew    Quit date: 11/02/1978  Vaping Use   Vaping status: Never Used  Substance Use Topics   Alcohol use: Not Currently    Alcohol/week: 7.0 standard drinks of alcohol    Types: 7 Standard drinks or equivalent per week    Comment: 2-3 at a time; 2-3 times a week; previously: none in 2019 but did have 1 drink in June on vacation.   Drug use: No       OPHTHALMIC EXAM:  Base Eye Exam     Visual Acuity (Snellen - Linear)       Right Left   Dist cc 20/200 20/20 -2   Dist ph cc 20/100 NI    Correction: Glasses         Tonometry (Tonopen, 8:56 AM)       Right Left   Pressure 32 26  Dorz/Tim/Brim OD @8 :58am        Pupils       Pupils Dark Light Shape React APD   Right PERRL 4 2 Irregular Brisk None   Left PERRL 4 2 Round Brisk None         Visual Fields       Left Right    Full    Restrictions  Partial outer superior temporal, superior nasal deficiencies          Extraocular Movement       Right Left    Full, Ortho Full, Ortho         Neuro/Psych     Oriented x3: Yes   Mood/Affect: Normal         Dilation     Both eyes: 1.0% Mydriacyl , 2.5% Phenylephrine  @ 9:01 AM           Slit Lamp and Fundus Exam     External Exam       Right Left   External Normal Normal         Slit Lamp Exam       Right Left   Lids/Lashes Dermatochalasis Dermatochalasis, mild Ptosis   Conjunctiva/Sclera White and quiet, temporal pinguecula, Subconjunctival hemorrhage 360--improved, Sutures intact White and quiet   Cornea Arcus, 2+ fine PEE, irregular tear film, Well healed temporal cataract wound, well healed lasik flap scar, +EBMD superiorly trace tear film debris, well healed lasik flap   Anterior Chamber Deep, narrow angle, 0.5+ fine Cell/pigment Deep, narrow temporal angle   Iris Peaked pupil to 630, well dilated, No NVI, IK touch 0630 Round and dilated, no NVI   Lens PC IOL in good position, fine pigment on optic trace, trace Posterior capsular opacification 2-3+ Cortical cataract, 2-3+ Nuclear sclerosis   Anterior Vitreous Post vitrectomy, clear Vitreous syneresis         Fundus Exam       Right Left   Disc 2-3+ pallor, sharp rim Pink and Sharp   C/D Ratio 0.4 0.6   Macula Flat, good foveal reflex, RPE mottling, pigment choroidal lesion along ST arcades, No heme or  edema Flat, Blunted foveal reflex, RPE mottling, no heme or edema   Vessels Vascular attenuation, mild tortuosity Attenuated, mild tortuosity   Periphery Attached, good peripheral laser changes 360, Hazy view improved, choroidal effusion stably resolved, scattered MA/DBH Attached, no heme           Refraction     Wearing Rx       Sphere Cylinder Axis Add   Right +0.25 +1.25 167 +2.25   Left -0.50 +2.00 179 +2.25           IMAGING AND PROCEDURES  Imaging and Procedures for 11/10/2024  OCT, Retina - OU - Both Eyes       Right Eye Quality was good. Central  Foveal Thickness: 286. Progression has been stable. Findings include normal foveal contour, no IRF, no SRF (Grainy images improved; Stable improvement in vitreous opacities).   Left Eye Quality was good. Central Foveal Thickness: 286. Progression has been stable. Findings include normal foveal contour, no IRF, no SRF, vitreomacular adhesion .   Notes *Images captured and stored on drive  Diagnosis / Impression:  OD: grainy images; Stable improvement in vitreous opacities OS: NFP; no IRF/SRF   Clinical management:  See below  Abbreviations: NFP - Normal foveal profile. CME - cystoid macular edema. PED - pigment epithelial detachment. IRF - intraretinal fluid. SRF - subretinal fluid. EZ - ellipsoid zone. ERM - epiretinal membrane. ORA - outer retinal atrophy. ORT - outer retinal tubulation. SRHM - subretinal hyper-reflective material. IRHM - intraretinal hyper-reflective material             ASSESSMENT/PLAN:   ICD-10-CM   1. Vitreous hemorrhage of right eye (HCC)  H43.11 OCT, Retina - OU - Both Eyes    2. Choroidal effusion  H31.8     3. 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 Diabetes mellitus treated with injections of non-insulin  medication (HCC)  E11.9    Z79.85     5. Essential hypertension  I10     6. Hypertensive retinopathy of both eyes  H35.033     7. Pseudophakia of right eye  Z96.1     8. Combined forms of age-related cataract of left eye  H25.812      **S/p CEIOL OD on 07.25.25 complicated by ant vitreous prolapse and underwent limited anterior vitrectomy. - developed choroidal effusion and vit hemorrhage after surgery  1. Vitreous hemorrhage OD  - diffuse VH following cataract surgery - b-scan 08.08.25 shows diffuse vit opacities consistent with VH, no obvious RT/RD; +choroidal effusion (see below)  - s/p 25g PPV w/ EL OD for clearing out debris and VH OD on 11.13.25 - BCVA OD 20/150 - Exam on 12.11.25 shows 2+ fine PEE and  irregular tear film OD--increase AT use (below) - Continue OD drops:  -Pred currently once daily OD --ok to stop   -Cosopt  BID--continue  -Restart Brimonidine  BID OD  -Use Ats QID then lubricating ointment QHS - gtt sheet updated and given 12.11.25 visit - f/u in 4 wks, DFE, OCT  2. Choroidal Effusion OD - improved, stable  3,4. Diabetes mellitus, type 2 without retinopathy - The incidence, risk factors for progression, natural history and treatment options for diabetic retinopathy were discussed with patient.   - The need for close monitoring of blood glucose, blood pressure, and serum lipids, avoiding cigarette or any type of tobacco, and the need for long term follow up was also discussed with patient. - f/u in 1 year,  sooner prn  5,6. Hypertensive retinopathy OU - discussed importance of tight BP control - monitor  7. Pseudophakia OD  - s/p CE/IOL OD  - IOL in good position -- VH and choroidal effusion (see above)  - monitor  8. Mixed Cataract OS - The symptoms of cataract, surgical options, and treatments and risks were discussed with patient. - discussed diagnosis and progression - under the expert management of Dr. Juli  Ophthalmic Meds Ordered this visit:  No orders of the defined types were placed in this encounter.    No follow-ups on file.  There are no Patient Instructions on file for this visit.  This document serves as a record of services personally performed by Redell JUDITHANN Hans, MD, PhD. It was created on their behalf by Delon Newness COT, an ophthalmic technician. The creation of this record is the provider's dictation and/or activities during the visit.    Electronically signed by: Delon Newness COT 01.02.26 10:04 AM  This document serves as a record of services personally performed by Redell JUDITHANN Hans, MD, PhD. It was created on their behalf by Wanda GEANNIE Keens, COT an ophthalmic technician. The creation of this record is the provider's  dictation and/or activities during the visit.    Electronically signed by:  Wanda GEANNIE Keens, COT  11/10/2024 10:04 AM   This document serves as a record of services personally performed by Redell JUDITHANN Hans, MD, PhD. It was created on their behalf by Almetta Pesa, an ophthalmic technician. The creation of this record is the provider's dictation and/or activities during the visit.    Electronically signed by: Almetta Pesa, OA, 11/10/2024  10:04 AM  Abbreviations: M myopia (nearsighted); A astigmatism; H hyperopia (farsighted); P presbyopia; Mrx spectacle prescription;  CTL contact lenses; OD right eye; OS left eye; OU both eyes  XT exotropia; ET esotropia; PEK punctate epithelial keratitis; PEE punctate epithelial erosions; DES dry eye syndrome; MGD meibomian gland dysfunction; ATs artificial tears; PFAT's preservative free artificial tears; NSC nuclear sclerotic cataract; PSC posterior subcapsular cataract; ERM epi-retinal membrane; PVD posterior vitreous detachment; RD retinal detachment; DM diabetes mellitus; DR diabetic retinopathy; NPDR non-proliferative diabetic retinopathy; PDR proliferative diabetic retinopathy; CSME clinically significant macular edema; DME diabetic macular edema; dbh dot blot hemorrhages; CWS cotton wool spot; POAG primary open angle glaucoma; C/D cup-to-disc ratio; HVF humphrey visual field; GVF goldmann visual field; OCT optical coherence tomography; IOP intraocular pressure; BRVO Branch retinal vein occlusion; CRVO central retinal vein occlusion; CRAO central retinal artery occlusion; BRAO branch retinal artery occlusion; RT retinal tear; SB scleral buckle; PPV pars plana vitrectomy; VH Vitreous hemorrhage; PRP panretinal laser photocoagulation; IVK intravitreal kenalog ; VMT vitreomacular traction; MH Macular hole;  NVD neovascularization of the disc; NVE neovascularization elsewhere; AREDS age related eye disease study; ARMD age related macular degeneration; POAG primary  open angle glaucoma; EBMD epithelial/anterior basement membrane dystrophy; ACIOL anterior chamber intraocular lens; IOL intraocular lens; PCIOL posterior chamber intraocular lens; Phaco/IOL phacoemulsification with intraocular lens placement; PRK photorefractive keratectomy; LASIK laser assisted in situ keratomileusis; HTN hypertension; DM diabetes mellitus; COPD chronic obstructive pulmonary disease  "

## 2024-11-10 ENCOUNTER — Ambulatory Visit (INDEPENDENT_AMBULATORY_CARE_PROVIDER_SITE_OTHER): Admitting: Ophthalmology

## 2024-11-10 ENCOUNTER — Encounter (INDEPENDENT_AMBULATORY_CARE_PROVIDER_SITE_OTHER): Payer: Self-pay | Admitting: Ophthalmology

## 2024-11-10 DIAGNOSIS — H35033 Hypertensive retinopathy, bilateral: Secondary | ICD-10-CM | POA: Diagnosis not present

## 2024-11-10 DIAGNOSIS — H318 Other specified disorders of choroid: Secondary | ICD-10-CM

## 2024-11-10 DIAGNOSIS — E119 Type 2 diabetes mellitus without complications: Secondary | ICD-10-CM

## 2024-11-10 DIAGNOSIS — I10 Essential (primary) hypertension: Secondary | ICD-10-CM | POA: Diagnosis not present

## 2024-11-10 DIAGNOSIS — Z7985 Long-term (current) use of injectable non-insulin antidiabetic drugs: Secondary | ICD-10-CM

## 2024-11-10 DIAGNOSIS — H25812 Combined forms of age-related cataract, left eye: Secondary | ICD-10-CM

## 2024-11-10 DIAGNOSIS — H4311 Vitreous hemorrhage, right eye: Secondary | ICD-10-CM

## 2024-11-10 DIAGNOSIS — Z961 Presence of intraocular lens: Secondary | ICD-10-CM

## 2024-11-10 MED ORDER — BRIMONIDINE TARTRATE 0.2 % OP SOLN: 1.0000 [drp] | Freq: Every day | OPHTHALMIC | 3 refills | Status: AC

## 2024-11-12 ENCOUNTER — Encounter (INDEPENDENT_AMBULATORY_CARE_PROVIDER_SITE_OTHER): Payer: Self-pay | Admitting: Ophthalmology

## 2024-11-22 ENCOUNTER — Other Ambulatory Visit (INDEPENDENT_AMBULATORY_CARE_PROVIDER_SITE_OTHER): Payer: Self-pay

## 2024-11-22 MED ORDER — DORZOLAMIDE HCL-TIMOLOL MAL 2-0.5 % OP SOLN: 1.0000 [drp] | Freq: Two times a day (BID) | OPHTHALMIC | 3 refills | Status: AC

## 2024-12-08 ENCOUNTER — Encounter (INDEPENDENT_AMBULATORY_CARE_PROVIDER_SITE_OTHER): Payer: Self-pay | Admitting: Ophthalmology

## 2024-12-08 ENCOUNTER — Ambulatory Visit (INDEPENDENT_AMBULATORY_CARE_PROVIDER_SITE_OTHER): Admitting: Ophthalmology

## 2024-12-08 DIAGNOSIS — H35033 Hypertensive retinopathy, bilateral: Secondary | ICD-10-CM

## 2024-12-08 DIAGNOSIS — H4311 Vitreous hemorrhage, right eye: Secondary | ICD-10-CM

## 2024-12-08 DIAGNOSIS — E119 Type 2 diabetes mellitus without complications: Secondary | ICD-10-CM

## 2024-12-08 DIAGNOSIS — I10 Essential (primary) hypertension: Secondary | ICD-10-CM

## 2024-12-08 DIAGNOSIS — Z7985 Long-term (current) use of injectable non-insulin antidiabetic drugs: Secondary | ICD-10-CM

## 2024-12-08 DIAGNOSIS — Z961 Presence of intraocular lens: Secondary | ICD-10-CM

## 2024-12-08 DIAGNOSIS — H318 Other specified disorders of choroid: Secondary | ICD-10-CM

## 2024-12-08 DIAGNOSIS — H25812 Combined forms of age-related cataract, left eye: Secondary | ICD-10-CM

## 2024-12-08 MED ORDER — LATANOPROST 0.005 % OP SOLN: 1.0000 [drp] | Freq: Every day | OPHTHALMIC | 11 refills | Status: AC

## 2025-01-05 ENCOUNTER — Encounter (INDEPENDENT_AMBULATORY_CARE_PROVIDER_SITE_OTHER): Admitting: Ophthalmology
